# Patient Record
Sex: Male | Born: 1970 | Race: White | Hispanic: No | Marital: Single | Smoking: Former smoker
Health system: Southern US, Community
[De-identification: ages and names within clinical notes are randomized; demographics above are authoritative.]

## PROBLEM LIST (undated history)

## (undated) DIAGNOSIS — R569 Unspecified convulsions: Secondary | ICD-10-CM

## (undated) DIAGNOSIS — S069XAA Unspecified intracranial injury with loss of consciousness status unknown, initial encounter: Secondary | ICD-10-CM

## (undated) DIAGNOSIS — J45909 Unspecified asthma, uncomplicated: Secondary | ICD-10-CM

## (undated) DIAGNOSIS — J449 Chronic obstructive pulmonary disease, unspecified: Secondary | ICD-10-CM

## (undated) HISTORY — DX: Chronic obstructive pulmonary disease, unspecified: J44.9

---

## 2008-03-13 ENCOUNTER — Emergency Department (HOSPITAL_COMMUNITY): Admission: EM | Admit: 2008-03-13 | Discharge: 2008-03-13 | Payer: Self-pay | Admitting: Emergency Medicine

## 2010-01-09 ENCOUNTER — Emergency Department (HOSPITAL_COMMUNITY): Admission: EM | Admit: 2010-01-09 | Discharge: 2010-01-09 | Payer: Self-pay | Admitting: Emergency Medicine

## 2011-02-12 LAB — URINE MICROSCOPIC-ADD ON

## 2011-02-12 LAB — COMPREHENSIVE METABOLIC PANEL
ALT: 12 U/L (ref 0–53)
BUN: 12 mg/dL (ref 6–23)
Creatinine, Ser: 1.1 mg/dL (ref 0.4–1.5)
GFR calc Af Amer: >60 mL/min (ref >60)
GFR calc non Af Amer: >60 mL/min (ref >60)
Sodium: 143 mEq/L (ref 135–145)

## 2011-02-12 LAB — DIFFERENTIAL
Basophils Absolute: 0 10*3/uL (ref 0.0–0.1)
Basophils Relative: 0 % (ref 0–1)
Eosinophils Absolute: 0 10*3/uL (ref 0.0–0.7)
Monocytes Relative: 6 % (ref 3–12)
Neutro Abs: 19.6 10*3/uL — ABNORMAL HIGH (ref 1.7–7.7)
Neutrophils Relative %: 87 % — ABNORMAL HIGH (ref 43–77)

## 2011-02-12 LAB — RAPID URINE DRUG SCREEN, HOSP PERFORMED
Barbiturates: NOT DETECTED
Tetrahydrocannabinol: POSITIVE — AB

## 2011-02-12 LAB — CBC
Hemoglobin: 15.5 g/dL (ref 13.0–17.0)
MCV: 92.9 fL (ref 78.0–100.0)
Platelets: 272 10*3/uL (ref 150–400)
RDW: 12.1 % (ref 11.5–15.5)
WBC: 22.5 10*3/uL — ABNORMAL HIGH (ref 4.0–10.5)

## 2011-02-12 LAB — URINALYSIS, ROUTINE W REFLEX MICROSCOPIC
Bilirubin Urine: NEGATIVE
Nitrite: NEGATIVE
Specific Gravity, Urine: 1.016 (ref 1.005–1.030)
pH: 5.5 (ref 5.0–8.0)

## 2014-07-28 ENCOUNTER — Emergency Department (HOSPITAL_COMMUNITY): Payer: Self-pay

## 2014-07-28 ENCOUNTER — Encounter (HOSPITAL_COMMUNITY): Payer: Self-pay | Admitting: Emergency Medicine

## 2014-07-28 ENCOUNTER — Observation Stay (HOSPITAL_COMMUNITY)
Admission: EM | Admit: 2014-07-28 | Discharge: 2014-07-30 | Disposition: A | Discharge status: Home / Self Care | Payer: Self-pay | Attending: Internal Medicine | Admitting: Internal Medicine

## 2014-07-28 DIAGNOSIS — E878 Other disorders of electrolyte and fluid balance, not elsewhere classified: Secondary | ICD-10-CM

## 2014-07-28 DIAGNOSIS — H9209 Otalgia, unspecified ear: Secondary | ICD-10-CM | POA: Insufficient documentation

## 2014-07-28 DIAGNOSIS — S0003XA Contusion of scalp, initial encounter: Secondary | ICD-10-CM | POA: Insufficient documentation

## 2014-07-28 DIAGNOSIS — M79609 Pain in unspecified limb: Secondary | ICD-10-CM

## 2014-07-28 DIAGNOSIS — F411 Generalized anxiety disorder: Secondary | ICD-10-CM | POA: Diagnosis present

## 2014-07-28 DIAGNOSIS — S0083XA Contusion of other part of head, initial encounter: Secondary | ICD-10-CM | POA: Insufficient documentation

## 2014-07-28 DIAGNOSIS — R5383 Other fatigue: Secondary | ICD-10-CM

## 2014-07-28 DIAGNOSIS — R1031 Right lower quadrant pain: Secondary | ICD-10-CM

## 2014-07-28 DIAGNOSIS — H539 Unspecified visual disturbance: Secondary | ICD-10-CM | POA: Insufficient documentation

## 2014-07-28 DIAGNOSIS — R6883 Chills (without fever): Secondary | ICD-10-CM | POA: Insufficient documentation

## 2014-07-28 DIAGNOSIS — Z79899 Other long term (current) drug therapy: Secondary | ICD-10-CM | POA: Insufficient documentation

## 2014-07-28 DIAGNOSIS — R569 Unspecified convulsions: Secondary | ICD-10-CM

## 2014-07-28 DIAGNOSIS — F1220 Cannabis dependence, uncomplicated: Secondary | ICD-10-CM

## 2014-07-28 DIAGNOSIS — G40909 Epilepsy, unspecified, not intractable, without status epilepticus: Principal | ICD-10-CM | POA: Insufficient documentation

## 2014-07-28 DIAGNOSIS — E876 Hypokalemia: Secondary | ICD-10-CM | POA: Insufficient documentation

## 2014-07-28 DIAGNOSIS — J45909 Unspecified asthma, uncomplicated: Secondary | ICD-10-CM | POA: Insufficient documentation

## 2014-07-28 DIAGNOSIS — R5381 Other malaise: Secondary | ICD-10-CM | POA: Insufficient documentation

## 2014-07-28 DIAGNOSIS — R42 Dizziness and giddiness: Secondary | ICD-10-CM | POA: Insufficient documentation

## 2014-07-28 DIAGNOSIS — M25512 Pain in left shoulder: Secondary | ICD-10-CM | POA: Diagnosis present

## 2014-07-28 DIAGNOSIS — T148XXA Other injury of unspecified body region, initial encounter: Secondary | ICD-10-CM

## 2014-07-28 DIAGNOSIS — R1032 Left lower quadrant pain: Secondary | ICD-10-CM

## 2014-07-28 DIAGNOSIS — Y929 Unspecified place or not applicable: Secondary | ICD-10-CM | POA: Insufficient documentation

## 2014-07-28 DIAGNOSIS — Y939 Activity, unspecified: Secondary | ICD-10-CM | POA: Insufficient documentation

## 2014-07-28 DIAGNOSIS — F419 Anxiety disorder, unspecified: Secondary | ICD-10-CM | POA: Diagnosis present

## 2014-07-28 DIAGNOSIS — Z23 Encounter for immunization: Secondary | ICD-10-CM | POA: Insufficient documentation

## 2014-07-28 DIAGNOSIS — X58XXXA Exposure to other specified factors, initial encounter: Secondary | ICD-10-CM | POA: Insufficient documentation

## 2014-07-28 DIAGNOSIS — S1093XA Contusion of unspecified part of neck, initial encounter: Secondary | ICD-10-CM

## 2014-07-28 HISTORY — DX: Unspecified asthma, uncomplicated: J45.909

## 2014-07-28 LAB — PHOSPHORUS
Phosphorus: 1.6 mg/dL — ABNORMAL LOW (ref 2.3–4.6)
Phosphorus: 1.8 mg/dL — ABNORMAL LOW (ref 2.3–4.6)
Phosphorus: 1.8 mg/dL — ABNORMAL LOW (ref 2.3–4.6)

## 2014-07-28 LAB — CBC
HCT: 45.5 % (ref 39.0–52.0)
Hemoglobin: 15.6 g/dL (ref 13.0–17.0)
MCH: 31.8 pg (ref 26.0–34.0)
MCHC: 34.3 g/dL (ref 30.0–36.0)
MCV: 92.9 fL (ref 78.0–100.0)
Platelets: 264 10*3/uL (ref 150–400)
RBC: 4.9 MIL/uL (ref 4.22–5.81)
RDW: 12.9 % (ref 11.5–15.5)
WBC: 13.1 10*3/uL — ABNORMAL HIGH (ref 4.0–10.5)

## 2014-07-28 LAB — BASIC METABOLIC PANEL
ANION GAP: 14 (ref 5–15)
Anion gap: 10 (ref 5–15)
Anion gap: 10 (ref 5–15)
BUN: 10 mg/dL (ref 6–23)
BUN: 11 mg/dL (ref 6–23)
BUN: 9 mg/dL (ref 6–23)
CALCIUM: 6.1 mg/dL — AB (ref 8.4–10.5)
CALCIUM: 8.5 mg/dL (ref 8.4–10.5)
CALCIUM: 8.5 mg/dL (ref 8.4–10.5)
CO2: 19 meq/L (ref 19–32)
CO2: 24 meq/L (ref 19–32)
CO2: 20 mEq/L (ref 19–32)
CREATININE: 1.09 mg/dL (ref 0.50–1.35)
Chloride: 116 mEq/L — ABNORMAL HIGH (ref 96–112)
Chloride: 107 mEq/L (ref 96–112)
Chloride: 107 mEq/L (ref 96–112)
Creatinine, Ser: 0.75 mg/dL (ref 0.50–1.35)
Creatinine, Ser: 1.04 mg/dL (ref 0.50–1.35)
GFR calc Af Amer: >90 mL/min (ref >90)
GFR calc Af Amer: >90 mL/min (ref >90)
GFR calc non Af Amer: 81 mL/min — ABNORMAL LOW (ref >90)
GFR calc non Af Amer: 86 mL/min — ABNORMAL LOW (ref >90)
GLUCOSE: 81 mg/dL (ref 70–99)
GLUCOSE: 92 mg/dL (ref 70–99)
Glucose, Bld: 95 mg/dL (ref 70–99)
POTASSIUM: 2.6 meq/L — AB (ref 3.7–5.3)
Potassium: 5.1 mEq/L (ref 3.7–5.3)
Potassium: 4.2 mEq/L (ref 3.7–5.3)
SODIUM: 145 meq/L (ref 137–147)
Sodium: 141 mEq/L (ref 137–147)
Sodium: 141 mEq/L (ref 137–147)

## 2014-07-28 LAB — URINALYSIS, ROUTINE W REFLEX MICROSCOPIC
BILIRUBIN URINE: NEGATIVE
Glucose, UA: NEGATIVE mg/dL
Hgb urine dipstick: NEGATIVE
Ketones, ur: NEGATIVE mg/dL
Leukocytes, UA: NEGATIVE
NITRITE: NEGATIVE
PH: 6 (ref 5.0–8.0)
Protein, ur: NEGATIVE mg/dL
Specific Gravity, Urine: 1.019 (ref 1.005–1.030)
Urobilinogen, UA: 0.2 mg/dL (ref 0.0–1.0)

## 2014-07-28 LAB — HEPATIC FUNCTION PANEL
ALT: 37 U/L (ref 0–53)
AST: 26 U/L (ref 0–37)
Albumin: 3.6 g/dL (ref 3.5–5.2)
Alkaline Phosphatase: 48 U/L (ref 39–117)
Total Bilirubin: 0.3 mg/dL (ref 0.3–1.2)
Total Protein: 6.7 g/dL (ref 6.0–8.3)

## 2014-07-28 LAB — MAGNESIUM: Magnesium: 2.2 mg/dL (ref 1.5–2.5)

## 2014-07-28 LAB — RAPID URINE DRUG SCREEN, HOSP PERFORMED
Amphetamines: NOT DETECTED
BARBITURATES: NOT DETECTED
BENZODIAZEPINES: NOT DETECTED
COCAINE: NOT DETECTED
OPIATES: NOT DETECTED
Tetrahydrocannabinol: POSITIVE — AB

## 2014-07-28 LAB — TSH: TSH: 1.49 u[IU]/mL (ref 0.350–4.500)

## 2014-07-28 LAB — T3, FREE: T3 FREE: 3.8 pg/mL (ref 2.3–4.2)

## 2014-07-28 LAB — CBG MONITORING, ED
GLUCOSE-CAPILLARY: 98 mg/dL (ref 70–99)
Glucose-Capillary: 41 mg/dL — CL (ref 70–99)

## 2014-07-28 LAB — I-STAT TROPONIN, ED: Troponin i, poc: 0.03 ng/mL (ref 0.00–0.08)

## 2014-07-28 MED ORDER — HEPARIN SODIUM (PORCINE) 5000 UNIT/ML IJ SOLN
5000.0000 [IU] | Freq: Three times a day (TID) | INTRAMUSCULAR | Status: DC
  Administered 2014-07-28 – 2014-07-30 (×6): 5000 [IU] via SUBCUTANEOUS
  Filled 2014-07-28 (×8): qty 1

## 2014-07-28 MED ORDER — POTASSIUM CHLORIDE CRYS ER 20 MEQ PO TBCR: 40.0000 meq | EXTENDED_RELEASE_TABLET | Freq: Once | ORAL | Status: DC

## 2014-07-28 MED ORDER — K PHOS MONO-SOD PHOS DI & MONO 155-852-130 MG PO TABS
500.0000 mg | ORAL_TABLET | Freq: Two times a day (BID) | ORAL | Status: DC
  Filled 2014-07-28: qty 2

## 2014-07-28 MED ORDER — K PHOS MONO-SOD PHOS DI & MONO 155-852-130 MG PO TABS
250.0000 mg | ORAL_TABLET | Freq: Three times a day (TID) | ORAL | Status: AC
  Administered 2014-07-28 – 2014-07-29 (×3): 250 mg via ORAL
  Filled 2014-07-28 (×3): qty 1

## 2014-07-28 MED ORDER — OXYCODONE-ACETAMINOPHEN 5-325 MG PO TABS
1.0000 | ORAL_TABLET | Freq: Four times a day (QID) | ORAL | Status: DC | PRN
  Administered 2014-07-28: 1 via ORAL
  Filled 2014-07-28: qty 1

## 2014-07-28 MED ORDER — ALBUTEROL SULFATE (2.5 MG/3ML) 0.083% IN NEBU
2.5000 mg | INHALATION_SOLUTION | Freq: Four times a day (QID) | RESPIRATORY_TRACT | Status: DC | PRN
  Administered 2014-07-29 – 2014-07-30 (×2): 2.5 mg via RESPIRATORY_TRACT
  Filled 2014-07-28 (×2): qty 3

## 2014-07-28 MED ORDER — POTASSIUM CHLORIDE 10 MEQ/100ML IV SOLN
10.0000 meq | Freq: Once | INTRAVENOUS | Status: AC
  Administered 2014-07-28: 10 meq via INTRAVENOUS
  Filled 2014-07-28: qty 100

## 2014-07-28 MED ORDER — SODIUM CHLORIDE 0.9 % IJ SOLN
3.0000 mL | Freq: Two times a day (BID) | INTRAMUSCULAR | Status: DC
  Administered 2014-07-30: 3 mL via INTRAVENOUS

## 2014-07-28 MED ORDER — ALBUTEROL SULFATE HFA 108 (90 BASE) MCG/ACT IN AERS: 2.0000 | INHALATION_SPRAY | Freq: Four times a day (QID) | RESPIRATORY_TRACT | Status: DC | PRN

## 2014-07-28 MED ORDER — PNEUMOCOCCAL VAC POLYVALENT 25 MCG/0.5ML IJ INJ
0.5000 mL | INJECTION | INTRAMUSCULAR | Status: AC
  Administered 2014-07-29: 0.5 mL via INTRAMUSCULAR
  Filled 2014-07-28: qty 0.5

## 2014-07-28 MED ORDER — CEFTRIAXONE SODIUM 1 G IJ SOLR
1.0000 g | Freq: Once | INTRAMUSCULAR | Status: AC
  Administered 2014-07-28: 1 g via INTRAVENOUS
  Filled 2014-07-28: qty 10

## 2014-07-28 MED ORDER — ONDANSETRON HCL 4 MG PO TABS: 4.0000 mg | ORAL_TABLET | Freq: Four times a day (QID) | ORAL | Status: DC | PRN

## 2014-07-28 MED ORDER — ACETAMINOPHEN 325 MG PO TABS: 650.0000 mg | ORAL_TABLET | Freq: Four times a day (QID) | ORAL | Status: DC | PRN

## 2014-07-28 MED ORDER — ACETAMINOPHEN 500 MG PO TABS
1000.0000 mg | ORAL_TABLET | Freq: Once | ORAL | Status: AC
  Administered 2014-07-28: 1000 mg via ORAL
  Filled 2014-07-28: qty 2

## 2014-07-28 MED ORDER — SODIUM CHLORIDE 0.9 % IV SOLN
1000.0000 mL | Freq: Once | INTRAVENOUS | Status: AC
  Administered 2014-07-28: 1000 mL via INTRAVENOUS

## 2014-07-28 MED ORDER — ONDANSETRON HCL 4 MG/2ML IJ SOLN
4.0000 mg | Freq: Four times a day (QID) | INTRAMUSCULAR | Status: DC | PRN
  Administered 2014-07-29 (×2): 4 mg via INTRAVENOUS
  Filled 2014-07-28 (×2): qty 2

## 2014-07-28 MED ORDER — SODIUM CHLORIDE 0.9 % IV SOLN
INTRAVENOUS | Status: DC
  Administered 2014-07-28 – 2014-07-30 (×3): via INTRAVENOUS

## 2014-07-28 MED ORDER — IBUPROFEN 600 MG PO TABS
600.0000 mg | ORAL_TABLET | Freq: Four times a day (QID) | ORAL | Status: DC | PRN
  Filled 2014-07-28: qty 1

## 2014-07-28 MED ORDER — POTASSIUM CHLORIDE CRYS ER 20 MEQ PO TBCR
40.0000 meq | EXTENDED_RELEASE_TABLET | Freq: Once | ORAL | Status: AC
  Administered 2014-07-28: 40 meq via ORAL
  Filled 2014-07-28: qty 2

## 2014-07-28 MED ORDER — ONDANSETRON HCL 4 MG/2ML IJ SOLN
4.0000 mg | Freq: Once | INTRAMUSCULAR | Status: AC
  Administered 2014-07-28: 4 mg via INTRAVENOUS
  Filled 2014-07-28: qty 2

## 2014-07-28 NOTE — ED Notes (Addendum)
Pt was out walking his dog this am and had witnessed 3 min seizure. Hx of seizure 5 years ago. sts nausea and blurred vision which has resolved. Pt A&Ox3. CBG 177. BP 110/75. HR 95. Pt has abrasion and pain to right hip and elbow. Pt also had nosebleed that resolved.
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
## 2014-07-28 NOTE — ED Notes (Signed)
Patient transported to X-ray 
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
## 2014-07-28 NOTE — ED Notes (Signed)
Pt remains in radiology 

## 2014-07-28 NOTE — ED Provider Notes (Signed)
CSN: [id number]     Arrival date & time 07/28/14  0827 History   First MD Initiated Contact with Patient 07/28/14 1053     Chief Complaint  Patient presents with  . Seizures     (Consider location/radiation/quality/duration/timing/severity/associated sxs/prior Treatment) HPI This patient has one prior history of a seizure 5 years ago. It seems there was not a very definitive diagnosis. He reports that he was diagnosed with a febrile seizure within 30 minutes of being at the hospital and then discharged. Today he awoke from sleep at about 6:45 AM in the morning, he did not note any specific symptoms at that point time and walked outside to let his dogs out. Patient is unaware of what happened subsequent to that he denies any preceding symptoms. His roommate came out to the barking of the dogs and found him at the bottom of 4 stairs on the concrete with his arms and legs moving in a seizure-like pattern. He describes them as convulsing with white foam in his mouth. This lasted approximately 5 minutes. Subsequent to this the patient went through approximately a 5-10 minute period of being disoriented and not able to really speak. Within the next 5-10 minutes some gradual gradual resolution began, and then he reports that about the time that they got to the hospital by EMS the patient seemed more oriented and back to himself. The patient reports now he has a significant headache on the top of his head. It's like a heavy pressure. He reports his vision is still slightly blurry. He has pain in his left shoulder and right hip from a fall. He is able to move them all be it is painful.   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 He went to bed at approximately 10:45 PM he reports he does smoke marijuana and had had some marijuana at approximately 9 PM yesterday evening. He does not use any other drugs or alcohol. He has not started any other new or recent  medications. Patient does identify now that he thinks about it over the past week or so he has had increasing fatigue, intermittent headache pressure-like in quality.  Past Medical History  Diagnosis Date  . Asthma    History reviewed. No pertinent past surgical history. History reviewed. No pertinent family history. History  Substance Use Topics  . Smoking status: Never Smoker   . Smokeless tobacco: Not on file  . Alcohol Use: No    Review of Systems  Constitutional: Positive for chills and fatigue. Negative for fever, activity change and appetite change.  HENT: Positive for ear pain and sinus pressure. Negative for facial swelling and nosebleeds.   Eyes: Positive for visual disturbance (the patient has noticed some increased blurring of the vision and necessity to work harder to focus.).  Respiratory: Negative.   Cardiovascular: Negative.   Gastrointestinal: Negative.   Genitourinary: Negative.   Neurological: Positive for dizziness, weakness and headaches. Negative for tremors, syncope, facial asymmetry and speech difficulty.      Allergies  Review of patient's allergies indicates no known allergies.  Home Medications   Prior to Admission medications   Medication Sig Start Date End Date Taking? Authorizing Provider  albuterol (PROVENTIL HFA;VENTOLIN HFA) 108 (90 BASE) MCG/ACT inhaler Inhale 2 puffs into the lungs every 6 (six) hours as needed for wheezing or shortness of breath.   Yes Historical Provider, MD   BP 116/81  Pulse 74  Temp(Src) 97.7  F (36.5 C) (Oral)  Resp 20  SpO2 99% Physical Exam Gen. appearance: The patient is a slender but well nourished appearing. He is alert but appears mildly anxious and in mild to moderate pain. Head face: Patient has a contusion to the right posterior scalp no bleeding or laceration associated. Eyes: Pupils are symmetric and reactive to light extraocular motions are intact. Nose: the patient does have a small amount of bloody  material in the right nare but there is no active bleeding and no clots. Oral cavity: No tongue laceration present posterior oropharynx is widely patent no apparent dental injury. Ears: Right ear has cerumen impaction. Left ear is clear with normal TM. Neck: No C-spine tenderness to direct palpation no pain with normal range of motion. Lungs: Clear to auscultation without wheeze rhonchi rail Heart: Regular no rub or gallop Abdomen: Soft nontender no mass. Pelvis: Patient has a 5 cm contusion to the anterior iliac spine. He also has superficial abrasion to the lateral hip area. He does however have intact range of motion. The pelvis is stable to compression. Extremities the patient has pain over the left lateral shoulder. At this point in time there is no apparent deformity or contusion or abrasion. The right elbow has a contusion over the olecranon with very superficial abrasion. Range of motion is intact. Patient has contusions to both knees there is no deformity or effusion associated. The range of motion is intact. Normal visual inspection of the back bony elements are nontender to palpation. Neurologic: Patient is alert and oriented x3. Cranial nerves are intact. Upper extremity strength and lower extremity strength is 5 out of 5. Cerebellar examination is normal. Skin: Warm and dry  ED Course  Procedures (including critical care time) Labs Review Labs Reviewed  CBC - Abnormal; Notable for the following:    WBC 13.1 (*)    All other components within normal limits  CBG MONITORING, ED - Abnormal; Notable for the following:    Glucose-Capillary 41 (*)    All other components within normal limits  BASIC METABOLIC PANEL  CBG MONITORING, ED    Imaging Review No results found.   EKG Interpretation None     The patient has been admitted to the hospitalist service. Also I did review the case with Dr. Amada Jupiter of neurology who will evaluate the patient as well. CRITICAL  CARE Performed by: Arby Barrette   Total critical care time: 30 minutes  Critical care time was exclusive of separately billable procedures and treating other patients.  Critical care was necessary to treat or prevent imminent or life-threatening deterioration.  Critical care was time spent personally by me on the following activities: development of treatment plan with patient and/or surrogate as well as nursing, discussions with consultants, evaluation of patient's response to treatment, examination of patient, obtaining history from patient or surrogate, ordering and performing treatments and interventions, ordering and review of laboratory studies, ordering and review of radiographic studies, pulse oximetry and re-evaluation of patient's condition. MDM   Final diagnoses:  Seizure  Hypocalcemia  Hypokalemia  Contusion   This patient will be admitted for further workup with a seizure and electrolyte abnormalities. The patient has remained stable in terms of mental status and vital signs. Injuries resulting from his seizure appear to be limited to soft tissue contusion and abrasion. Further workup will be needed to determine the etiology of the patient's hypokalemia, hypocalcemia and seizure.     Arby Barrette, MD 07/28/14 1420

## 2014-07-28 NOTE — ED Notes (Signed)
Patient transported to CT 

## 2014-07-29 ENCOUNTER — Observation Stay (HOSPITAL_COMMUNITY): Payer: Self-pay

## 2014-07-29 DIAGNOSIS — J329 Chronic sinusitis, unspecified: Secondary | ICD-10-CM

## 2014-07-29 DIAGNOSIS — F411 Generalized anxiety disorder: Secondary | ICD-10-CM | POA: Diagnosis present

## 2014-07-29 DIAGNOSIS — M25519 Pain in unspecified shoulder: Secondary | ICD-10-CM

## 2014-07-29 DIAGNOSIS — R569 Unspecified convulsions: Secondary | ICD-10-CM

## 2014-07-29 DIAGNOSIS — R1032 Left lower quadrant pain: Secondary | ICD-10-CM

## 2014-07-29 DIAGNOSIS — M25512 Pain in left shoulder: Secondary | ICD-10-CM | POA: Diagnosis present

## 2014-07-29 DIAGNOSIS — F1220 Cannabis dependence, uncomplicated: Secondary | ICD-10-CM | POA: Diagnosis present

## 2014-07-29 DIAGNOSIS — F419 Anxiety disorder, unspecified: Secondary | ICD-10-CM | POA: Diagnosis present

## 2014-07-29 DIAGNOSIS — R1031 Right lower quadrant pain: Secondary | ICD-10-CM | POA: Diagnosis present

## 2014-07-29 LAB — BASIC METABOLIC PANEL
Anion gap: 10 (ref 5–15)
BUN: 10 mg/dL (ref 6–23)
CHLORIDE: 105 meq/L (ref 96–112)
CO2: 24 mEq/L (ref 19–32)
Calcium: 8.2 mg/dL — ABNORMAL LOW (ref 8.4–10.5)
Creatinine, Ser: 1.09 mg/dL (ref 0.50–1.35)
GFR calc Af Amer: >90 mL/min (ref >90)
GFR calc non Af Amer: 81 mL/min — ABNORMAL LOW (ref >90)
GLUCOSE: 108 mg/dL — AB (ref 70–99)
POTASSIUM: 4.2 meq/L (ref 3.7–5.3)
Sodium: 139 mEq/L (ref 137–147)

## 2014-07-29 LAB — CBC
HCT: 39.7 % (ref 39.0–52.0)
HEMOGLOBIN: 13.7 g/dL (ref 13.0–17.0)
MCH: 31.7 pg (ref 26.0–34.0)
MCHC: 34.5 g/dL (ref 30.0–36.0)
MCV: 91.9 fL (ref 78.0–100.0)
Platelets: 271 10*3/uL (ref 150–400)
RBC: 4.32 MIL/uL (ref 4.22–5.81)
RDW: 13.1 % (ref 11.5–15.5)
WBC: 9.3 10*3/uL (ref 4.0–10.5)

## 2014-07-29 LAB — VITAMIN B12: Vitamin B-12: 689 pg/mL (ref 211–911)

## 2014-07-29 LAB — HIV ANTIBODY (ROUTINE TESTING W REFLEX): HIV 1&2 Ab, 4th Generation: NONREACTIVE

## 2014-07-29 LAB — TROPONIN I: Troponin I: <0.3 ng/mL (ref <0.30)

## 2014-07-29 MED ORDER — AMOXICILLIN-POT CLAVULANATE 500-125 MG PO TABS
1.0000 | ORAL_TABLET | Freq: Three times a day (TID) | ORAL | Status: DC
  Administered 2014-07-29 – 2014-07-30 (×3): 500 mg via ORAL
  Filled 2014-07-29 (×6): qty 1

## 2014-07-29 NOTE — H&P (Signed)
INTERNAL MEDICINE TEACHING ATTENDING NOTE  Day 1 of stay  Patient name: Wayne Hawkins  MRN: [id number] Date of birth: 1971/01/19   Key clinical points and exam                                                          43 y.o. male with witnessed seizures - appear generalized clonic-tonic by description, likely new onset, but claims to have had the same exact seizure 5 years ago, witnessed by the same friend who saw him this time. 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 CT head negative for acute changes. Patient takes homegrown marijuana 2-3 times a day. Last does at 9:30 pm on 04/26/14. 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 Complains of seeing spots in front of eyes. Complains of dizziness with exertion, and with standing up from lying or sitting position. Reports anxiety, sleep disturbance, depression, shakiness/tremors, sweating, headache.  Past medical history: Syncope in 2011 ED visit, diagnosed of a viral infection at that time; Marijuana dependence; Tachycardia at rest.   Exam: vitals reviewed.  General: Resting in bed. Appears anxious. HEENT: PERRL, EOMI, no scleral icterus. Heart: Regular tachycardia, no rubs, murmurs or gallops. Lungs: wheezes positive, no rales, or rhonchi. Abdomen: Soft, nontender, nondistended, BS present. Extremities: Warm, no pedal edema. Tremors hands outstretched. Sore left inner thigh area to palpation, but no exterior abnormality. Neuro: Alert and oriented X3, cranial nerves II-XII grossly intact,  strength and sensation to light touch equal in bilateral upper and lower extremities   Assessment and Plan                                                                      Agree with plan of care documented by Dr Beckie Salts and Sherrine Maples.   Seizures - Neurology on consult. MRI brain & cervical spine in process. EEG pending. Initial work up showed major electrolyte imbalances, however it seems to be erroneous. Unsure if the  seizures could be related to marijuana overdose. TSH normal.   Left groin pain - likely musculoskeletal injury related.   Tachycardia - present back in 2011 notes as well. Unclear etiology. 2Decho for possible ?? tachycardia cardiomyopathy.   Marijuana dependence and withdrawal - The patient is anxious, nervous, sweating intermittently, tremors, vomited once, -- these are all classic signs of marijuana withdrawal although the patient is not ready to accept it, and says that marijuana does not have withdrawal signs. Symptomatic treatment.   Orthostatic dizziness - Orthostatic vitals, 2d echo to begin with work up. Physical therapy rehabilitation if needed. I did not walk the patient, but the patient feels unsteady to get up at this time.  Spots in front of the eyes at times - Never had transient loss of vision. CT head is clear. Will await MRI results. Will discuss this with neurology.   I have seen and evaluated this patient and discussed it with my IM resident team.  Please see the rest of the plan per resident note from today.   Aletta Edouard 07/29/2014,  11:23 AM.
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
## 2014-07-29 NOTE — Consult Note (Signed)
Patient ID: Wayne Hawkins MRN: [id number] DOB/AGE: 05-24-1971 43 y.o.  Admit date: 07/28/2014  Admission Diagnoses:  Principal Problem:   Observed seizure-like activity Active Problems:   Marijuana dependence   Severe pain of left shoulder   Bilateral groin pain   Anxiety state, unspecified   HPI: Patient was admitted yesterday for seizure.  We were asked to do a consult for left shoulder pain.  Started yesterday after the seizure.  No previous problems.  No feeling of instability.  States that shoulder is much getter now after it "popped".  MRI was done.    Past Medical History: Past Medical History  Diagnosis Date  . Asthma     Surgical History: History reviewed. No pertinent past surgical history.  Family History: History reviewed. No pertinent family history.  Social History: History   Social History  . Marital Status: Single    Spouse Name: N/A    Number of Children: N/A  . Years of Education: N/A   Occupational History  . Not on file.   Social History Main Topics  . Smoking status: Never Smoker   . Smokeless tobacco: Not on file  . Alcohol Use: No  . Drug Use: Not on file  . Sexual Activity: Yes   Other Topics Concern  . Not on file   Social History Narrative  . No narrative on file    Allergies: Review of patient's allergies indicates no known allergies.  Medications: I have reviewed the patient's current medications.  Vital Signs: Patient Vitals for the past 24 hrs:  BP Temp Temp src Pulse Resp SpO2  07/29/14 1659 - - - - - 96 %  07/29/14 1319 122/93 mmHg 98.9 F (37.2 C) Oral 81 18 95 %  07/29/14 0500 135/79 mmHg 98.7 F (37.1 C) - 75 18 98 %  07/28/14 2030 127/74 mmHg 98.5 F (36.9 C) - 81 18 96 %    Radiology: Dg Elbow 2 Views Right  07/28/2014   CLINICAL DATA:  Seizure, fell from porch, now with right elbow pain with small laceration  EXAM: RIGHT ELBOW - 2 VIEW  COMPARISON:  None.  FINDINGS: No displaced fracture or elbow  joint effusion. Regional soft tissues appear normal. No radiopaque foreign body. Joint spaces are preserved.  IMPRESSION: No displaced fracture, elbow joint effusion or radiopaque foreign body.   Electronically Signed   By: Simonne Come M.D.   On: 07/28/2014 13:24   Dg Hip Complete Right  07/28/2014   CLINICAL DATA:  Seizure and fall.  Right hip pain and bruising.  EXAM: RIGHT HIP - COMPLETE 2+ VIEW  COMPARISON:  None.  FINDINGS: No acute fracture or dislocation is identified about the right hip. Hip joint space width is preserved. No lytic or blastic osseous lesion is identified. Left hip is grossly located. SI joints are approximated. Pelvic phleboliths are noted.  IMPRESSION: No acute osseous abnormality identified.   Electronically Signed   By: Sebastian Ache   On: 07/28/2014 13:26   Ct Head Wo Contrast   (if New Onset Seizure And/or Head Trauma)  07/28/2014   CLINICAL DATA:  Seizure, blurred vision, epistaxis  EXAM: CT HEAD WITHOUT CONTRAST  TECHNIQUE: Contiguous axial images were obtained from the base of the skull through the vertex without intravenous contrast.  COMPARISON:  01/09/2010  FINDINGS: No evidence of parenchymal hemorrhage or extra-axial fluid collection. No mass lesion, mass effect, or midline shift.  No CT evidence of acute infarction.  Cerebral volume is  within normal limits.  No ventriculomegaly.  Partial opacification of the bilateral ethmoid sinuses. The mastoid air cells are unopacified.  No evidence of calvarial fracture.  IMPRESSION: Normal head CT.   Electronically Signed   By: Charline Bills M.D.   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 Possible febrile seizures 5 years ago. Recent generalized seizure lasted 5 min. Drug use. Abnormal potassium.  EXAM: MRI HEAD WITHOUT CONTRAST  TECHNIQUE: Multiplanar, multiecho pulse sequences of the brain and surrounding structures were  obtained without intravenous contrast.  COMPARISON:  07/28/2014 head CT. No comparison brain MR. Cervical spine MR performed same date and dictated separately.  FINDINGS: No acute infarct.  No evidence of mesial temporal sclerosis.  Anterior left frontal lobe encephalomalacia may be related to patient's remote traumatic injury and possibly represents a seizure focus.  No intracranial hemorrhage.  No hydrocephalus.  Asymmetric appearance of the pituitary gland with slight fullness on the left compared to the right. Left left-sided pituitary microadenoma could not be excluded on limited imaging through this region. It is possible this represents an incidental finding for this patient. Otherwise, no intracranial mass lesion noted on this unenhanced exam.  Cervical medullary junction, pineal region and orbital structures unremarkable.  Complete opacification right maxillary sinus. Moderate mucosal thickening remainder of paranasal sinuses.  IMPRESSION: No acute infarct.  No evidence of mesial temporal sclerosis.  Anterior left frontal lobe encephalomalacia may be related to patient's remote traumatic injury and possibly represents a seizure focus.  Asymmetric appearance of the pituitary gland with slight fullness on the left compared to the right. Left left-sided pituitary microadenoma could not be excluded on limited imaging through this region. It is possible this represents an incidental finding for this patient. Otherwise, no intracranial mass lesion noted on this unenhanced exam.  Complete opacification right maxillary sinus. Moderate mucosal thickening remainder of paranasal sinuses.   Electronically Signed   By: Bridgett Larsson M.D.   On: 07/29/2014 12:52   Mr Cervical Spine Wo Contrast  07/29/2014   CLINICAL DATA:  Seizure with fall.  Right shoulder pain.  EXAM: MRI CERVICAL SPINE WITHOUT CONTRAST  TECHNIQUE: Multiplanar, multisequence MR imaging of the cervical spine was performed. No intravenous contrast was  administered.  COMPARISON:  No comparison cervical spine exam.  FINDINGS: Patient had to move between axial and sagittal imaging. A scout sagittal sequence was therefore obtained for proper level assignment of axial imaging.  Cervical medullary junction unremarkable. No focal cervical cord signal abnormality.  No bone edema or soft tissue edema to suggest ligamentous or osseous injury. If osseous injury were of high clinical concern, CT imaging may be considered for further delineation.  Opacification right maxillary sinus and mucosal thickening left maxillary sinus.  Both vertebral arteries are patent. Visualized paravertebral structures unremarkable.  C2-3:  Negative.  C3-4:  Minimal right foraminal narrowing.  C4-5:  Minimal bulge.  C5-6: Shallow broad-based protrusion greatest right paracentral position. Mild spinal stenosis greater on the right.  C6-7: Moderate size right paracentral/ posterior lateral disc protrusion with mild flattening of the right aspect of the cord and narrowing of the right neural foramen.  C7-T1:  Negative.  IMPRESSION: C6-7 moderate size right paracentral/ posterior lateral disc protrusion with mild flattening of the right aspect of the cord and narrowing of the right neural foramen.  C5-6 shallow broad-based protrusion greatest right paracentral position. Mild  spinal stenosis greater on the right.  Patient had to move between axial and sagittal imaging. A scout sagittal sequence was therefore obtained for proper level assignment of axial imaging.  No bone edema or soft tissue edema to suggest ligamentous or osseous injury. If osseous injury were of high clinical concern, CT imaging may be considered for further delineation.  Opacification right maxillary sinus and mucosal thickening left maxillary sinus.   Electronically Signed   By: Bridgett Larsson M.D.   On: 07/29/2014 10:42   Mr Shoulder Left Wo Contrast  07/29/2014   CLINICAL DATA:  Left shoulder pain after seizure/ fall.  EXAM: MRI  OF THE LEFT SHOULDER WITHOUT CONTRAST  TECHNIQUE: Multiplanar, multisequence MR imaging of the shoulder was performed. No intravenous contrast was administered.  COMPARISON:  07/28/2014  FINDINGS: Despite efforts by the technologist and patient, motion artifact is present on today's exam and could not be eliminated. This reduces exam sensitivity and specificity.  Rotator cuff:  Intact  Muscles:  Subtle increased T2 signal in the teres minor muscle.  Biceps long head:  Intact  Acromioclavicular Joint: Mild degenerative arthropathy. Subacromial morphology is type 2 (curved). Trace edema along the coracoid acromial ligament.  Glenohumeral Joint: Lax appearance of the posterior inferior joint capsule making it difficult to completely exclude an injury of the posterior band of the inferior glenohumeral ligament or local capsular injury.  Labrum: Blunted posterior inferior labrum, could be torn, best appreciated on the axial images  Bones: Subtle edema anteriorly in the greater tuberosity, without fracture identified.  IMPRESSION: 1. Blunted posterior inferior labrum with lax appearance of the adjacent joint capsule, query labral tear and/or capsular injury. The possibility of injury to the posterior band of the IGL is also raised. MR arthrography could further delineate this region. 2. Faintly increased T2 signal in the teres minor muscle, potentially from strain or quadrilateral space syndrome. 3. Subtle edema anteriorly in the greater tuberosity of the humerus, probably degenerative.   Electronically Signed   By: Herbie Baltimore M.D.   On: 07/29/2014 13:09   Dg Shoulder Left  07/28/2014   CLINICAL DATA:  Seizure.  Fall.  EXAM: LEFT SHOULDER - 2+ VIEW  COMPARISON:  None.  FINDINGS: No acute fracture.  No dislocation.  IMPRESSION: No acute bony pathology.   Electronically Signed   By: Maryclare Bean M.D.   On: 07/28/2014 13:24    Labs:  Recent Labs  07/28/14 1020 07/29/14 0315  WBC 13.1* 9.3  RBC 4.90 4.32  HCT  45.5 39.7  PLT 264 271    Recent Labs  07/28/14 1820 07/29/14 0315  NA 141 139  K 4.2 4.2  CL 107 105  CO2 20 24  BUN 9 10  CREATININE 1.04 1.09  GLUCOSE 95 108*  CALCIUM 8.5 8.2*   No results found for this basename: LABPT, INR,  in the last 72 hours  Review of Systems: Review of Systems  Constitutional: Negative.   Respiratory: Negative.   Cardiovascular: Negative.   Musculoskeletal: Positive for joint pain.  Skin: Negative.   Psychiatric/Behavioral: Negative.     Physical Exam: Left shoulder.  Good ROM.  Mildly positive impingement test.  Neg obrien.  Neg drop arm.  Mild pain with supraspinatus resistance.  NVI.  Skin warm and dry.    Assessment and Plan: Left shoulder pain after seizure likely due to strain.  Pain is much better now after he had MRI scan.  Can f/u in office with Dr Hayden Rasmussen in  1-2 weeks for recheck.  Will very likely not need any surgical intervention for his shoulder.    Venita Lick, MD [facility] [phone]

## 2014-07-29 NOTE — Progress Notes (Signed)
Utilization Review Completed.   Lessie Funderburke, RN, BSN Nurse Case Manager  

## 2014-07-29 NOTE — Progress Notes (Signed)
Subjective: No further events.   Exam: Filed Vitals:   07/29/14 0500  BP: 135/79  Pulse: 75  Temp: 98.7 F (37.1 C)  Resp: 18   Gen: In bed, NAD MS: Awake, alert, interactive and appropriate JX:[id number], EOMI Motor: 5/5 thorughout Sensory:intact to LT DTR:3+ and symmetric  Repeat calciums have all been normal without replacement making think that the first value was spurious.   Impression: 43 yo M with second unprovoked seizure. In this setting I do typically recommend anti-epileptic therapy. May be reasonable to wait until after EEG to start therapy as it can lower yield. His hyperreflexia may be non-pathological, but it is prominent and therefore would also get C-Spine imagin.   Recommendations: 1) EEG 2) MRI brain/Cspine  Ritta Slot, MD [facility] [phone]  If 7pm- 7am, please page neurology on call as listed in AMION.

## 2014-07-30 ENCOUNTER — Observation Stay (HOSPITAL_COMMUNITY): Payer: Self-pay

## 2014-07-30 DIAGNOSIS — I951 Orthostatic hypotension: Secondary | ICD-10-CM

## 2014-07-30 MED ORDER — LEVETIRACETAM 500 MG PO TABS: 500.0000 mg | ORAL_TABLET | Freq: Two times a day (BID) | ORAL | Status: DC

## 2014-07-30 MED ORDER — LEVETIRACETAM 500 MG PO TABS
500.0000 mg | ORAL_TABLET | Freq: Two times a day (BID) | ORAL | Status: DC
  Administered 2014-07-30: 500 mg via ORAL
  Filled 2014-07-30 (×2): qty 1

## 2014-07-30 MED ORDER — AMOXICILLIN-POT CLAVULANATE 500-125 MG PO TABS: 1.0000 | ORAL_TABLET | Freq: Three times a day (TID) | ORAL | Status: DC

## 2014-07-30 MED ORDER — IBUPROFEN 600 MG PO TABS: 600.0000 mg | ORAL_TABLET | Freq: Four times a day (QID) | ORAL | Status: DC | PRN

## 2014-07-30 MED ORDER — SODIUM CHLORIDE 0.9 % IV BOLUS (SEPSIS)
1000.0000 mL | Freq: Once | INTRAVENOUS | Status: AC
  Administered 2014-07-30: 1000 mL via INTRAVENOUS

## 2014-07-30 NOTE — Progress Notes (Signed)
Subjective: No further events.   Exam: Filed Vitals:   07/30/14 0616  BP: 128/79  Pulse: 73  Temp: 98.3 F (36.8 C)  Resp: 18   Gen: In bed, NAD MS: Awake, alert, interactive and appropriate [id number], EOMI Motor: 5/5 thorughout Sensory:intact to LT DTR:3+ and symmetric  Repeat calciums have all been normal without replacement making think that the first value was spurious.   Impression: 43 yo M with second unprovoked seizure. In this setting I do typically recommend anti-epileptic therapy. May be reasonable to wait until after EEG to start therapy as it can lower yield. No signs of a cause of hyperreflexia on MRI, I do not think the C6-7 finding is enough to cause it. B12 was normal, do not think I would pursue further workup at this time unless he were to get worse or have gait difficulty or other myelopathic signs.   Recommendations: 1) EEG 2) following EEG would start keppra  IBD 3) Patient is unable to drive, operate heavy machinery, perform activities at heights or participate in water activities until release by outpatient physician. This was discussed with the patient who expressed understanding.    Ritta Slot, MD [facility] [phone]  If 7pm- 7am, please page neurology on call as listed in AMION.

## 2014-07-30 NOTE — Discharge Instructions (Signed)
It was a pleasure taking care of you, Mr. Sitar.  You will be started on a medication called Levetiracetam (Keppra) 500 mg twice a day to control your seizures. I have provided some information about this medication for you below. You will also need to follow up with a Neurologist at [facility] as an outpatient. You will be contacted about a date for an appointment. Also, please follow up with orthopedic surgery in regards to your left shoulder.  Levetiracetam tablets What is this medicine? LEVETIRACETAM (lee ve tye RA se tam) is an antiepileptic drug. It is used with other medicines to treat certain types of seizures. This medicine may be used for other purposes; ask your health care provider or pharmacist if you have questions. COMMON BRAND NAME(S): Keppra What should I tell my health care provider before I take this medicine? They need to know if you have any of these conditions: -kidney disease -suicidal thoughts, plans, or attempt; a previous suicide attempt by you or a family member -an unusual or allergic reaction to levetiracetam, other medicines, foods, dyes, or preservatives -pregnant or trying to get pregnant -breast-feeding How should I use this medicine? Take this medicine by mouth with a glass of water. Follow the directions on the prescription label. Swallow the tablets whole. Do not crush or chew this medicine. You may take this medicine with or without food. Take your doses at regular intervals. Do not take your medicine more often than directed. Do not stop taking this medicine or any of your seizure medicines unless instructed by your doctor or health care professional. Stopping your medicine suddenly can increase your seizures or their severity. A special MedGuide will be given to you by the pharmacist with each prescription and refill. Be sure to read this information carefully each time. Contact your pediatrician or health care professional regarding the use of this  medication in children. While this drug may be prescribed for children as young as 68 years of age for selected conditions, precautions do apply. Overdosage: If you think you have taken too much of this medicine contact a poison control center or emergency room at once. NOTE: This medicine is only for you. Do not share this medicine with others. What if I miss a dose? If you miss a dose, take it as soon as you can. If it is almost time for your next dose, take only that dose. Do not take double or extra doses. What may interact with this medicine? This medicine may interact with the following medications: -carbamazepine -colesevelam -probenecid -sevelamer This list may not describe all possible interactions. Give your health care provider a list of all the medicines, herbs, non-prescription drugs, or dietary supplements you use. Also tell them if you smoke, drink alcohol, or use illegal drugs. Some items may interact with your medicine. What should I watch for while using this medicine? Visit your doctor or health care professional for a regular check on your progress. Wear a medical identification bracelet or chain to say you have epilepsy, and carry a card that lists all your medications. It is important to take this medicine exactly as instructed by your health care professional. When first starting treatment, your dose may need to be adjusted. It may take weeks or months before your dose is stable. You should contact your doctor or health care professional if your seizures get worse or if you have any new types of seizures. You may get drowsy or dizzy. Do not drive, use machinery, or  do anything that needs mental alertness until you know how this medicine affects you. Do not stand or sit up quickly, especially if you are an older patient. This reduces the risk of dizzy or fainting spells. Alcohol may interfere with the effect of this medicine. Avoid alcoholic drinks. The use of this medicine may  increase the chance of suicidal thoughts or actions. Pay special attention to how you are responding while on this medicine. Any worsening of mood, or thoughts of suicide or dying should be reported to your health care professional right away. Women who become pregnant while using this medicine may enroll in the [place] Antiepileptic Drug Pregnancy Registry by calling [phone]. This registry collects information about the safety of antiepileptic drug use during pregnancy. What side effects may I notice from receiving this medicine? Side effects you should report to your doctor or health care professional as soon as possible: -allergic reactions like skin rash, itching or hives, swelling of the face, lips, or tongue -breathing problems -dark urine -general ill feeling or flu-like symptoms -problems with balance, talking, walking -unusually weak or tired -worsening of mood, thoughts or actions of suicide or dying -yellowing of the eyes or skin Side effects that usually do not require medical attention (report to your doctor or health care professional if they continue or are bothersome): -diarrhea -dizzy, drowsy -headache -loss of appetite This list may not describe all possible side effects. Call your doctor for medical advice about side effects. You may report side effects to FDA at 1-800-FDA-1088. Where should I keep my medicine? Keep out of reach of children. Store at room temperature between 15 and 30 degrees C (59 and 86 degrees F). Throw away any unused medicine after the expiration date. NOTE: This sheet is a summary. It may not cover all possible information. If you have questions about this medicine, talk to your doctor, pharmacist, or health care provider.  2015, [employer]vier/[employer] Standard. (2013-10-03 08:42:48)

## 2014-07-30 NOTE — Progress Notes (Signed)
Routine adult EEG completed, results pending. 

## 2014-07-30 NOTE — Progress Notes (Signed)
UR Completed Serene Kopf Graves-Bigelow, RN,BSN [phone]  
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
## 2014-07-30 NOTE — Procedures (Signed)
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 One channel was dedicated to EKG recording.    Background: The background consists of intermixed alpha and beta activities. here is a well defined posterior dominant rhythm of 10 Hz that attenuates with eye opening. He has a mild increase in delta with drowsiness and sleep spindles are observed.  Photic stimulation: Physiologic driving is present  EEG Abnormalities: None  Clinical Interpretation: This normal EEG is recorded in the waking and sleep state. There was no seizure or seizure predisposition recorded on this study.   Ritta Slot, MD [facility] [phone]  If 7pm- 7am, please page neurology on call as listed in AMION.

## 2014-07-31 NOTE — Consult Note (Signed)
Agree with above No need for acute surgical intervention Can f/u with my partner Dr Zachery Dauer in 2 weeks for ongoing care Will sign-off

## 2014-08-16 ENCOUNTER — Ambulatory Visit: Payer: Self-pay | Admitting: Neurology

## 2014-08-16 ENCOUNTER — Encounter: Payer: Self-pay | Admitting: *Deleted

## 2014-08-16 ENCOUNTER — Telehealth: Payer: Self-pay | Admitting: Neurology

## 2014-08-16 NOTE — Telephone Encounter (Signed)
Pt no showed today's appt. ER referral to Dr. Karel Jarvis.  Wayne Hawkins - please mail pt a no show letter / Wayne Hawkins.

## 2014-08-16 NOTE — Progress Notes (Signed)
No show letter sent for 08-15-14

## 2014-08-19 ENCOUNTER — Telehealth (HOSPITAL_BASED_OUTPATIENT_CLINIC_OR_DEPARTMENT_OTHER): Payer: Self-pay | Admitting: Emergency Medicine

## 2015-07-29 ENCOUNTER — Emergency Department (HOSPITAL_COMMUNITY)
Admission: EM | Admit: 2015-07-29 | Discharge: 2015-07-29 | Discharge status: Against Medical Advice | Payer: Self-pay | Attending: Emergency Medicine | Admitting: Emergency Medicine

## 2015-07-29 ENCOUNTER — Encounter (HOSPITAL_COMMUNITY): Payer: Self-pay | Admitting: Emergency Medicine

## 2015-07-29 DIAGNOSIS — G40909 Epilepsy, unspecified, not intractable, without status epilepticus: Secondary | ICD-10-CM | POA: Insufficient documentation

## 2015-07-29 DIAGNOSIS — J45909 Unspecified asthma, uncomplicated: Secondary | ICD-10-CM | POA: Insufficient documentation

## 2015-07-29 LAB — BASIC METABOLIC PANEL
ANION GAP: 10 (ref 5–15)
BUN: 10 mg/dL (ref 6–20)
CO2: 23 mmol/L (ref 22–32)
Calcium: 9 mg/dL (ref 8.9–10.3)
Chloride: 104 mmol/L (ref 101–111)
Creatinine, Ser: 1.2 mg/dL (ref 0.61–1.24)
Glucose, Bld: 147 mg/dL — ABNORMAL HIGH (ref 65–99)
Potassium: 3.8 mmol/L (ref 3.5–5.1)
SODIUM: 137 mmol/L (ref 135–145)

## 2015-07-29 LAB — CBG MONITORING, ED: GLUCOSE-CAPILLARY: 120 mg/dL — AB (ref 65–99)

## 2015-07-29 NOTE — Discharge Instructions (Signed)
° °  You have elected to leave AGAINST MEDICAL ADVICE. The risks associated with this are recurrent seizures, permanent neurological impairment and even death. It is important to follow up with neurology for reevaluation and possible antiseizure medications. Return to ED for worsening symptoms.  Driving and Equipment Restrictions Some medical problems make it dangerous to drive, ride a bike, or use machines. Some of these problems are:  A hard blow to the head (concussion).  Passing out (fainting).  Twitching and shaking (seizures).  Low blood sugar.  Taking medicine to help you relax (sedatives).  Taking pain medicines.  Wearing an eye patch.  Wearing splints. This can make it hard to use parts of your body that you need to drive safely. HOME CARE   Do not drive until your doctor says it is okay.  Do not use machines until your doctor says it is okay. You may need a form signed by your doctor (medical release) before you can drive again. You may also need this form before you do other tasks where you need to be fully alert. MAKE SURE YOU:  Understand these instructions.  Will watch your condition.  Will get help right away if you are not doing well or get worse. Document Released: 12/17/2004 Document Revised: 02/01/2012 Document Reviewed: 03/19/2010 [facility] Patient Information 2015 [place], [place]. This information is not intended to replace advice given to you by your health care provider. Make sure you discuss any questions you have with your health care provider.  Epilepsy People with epilepsy have times when they shake and jerk uncontrollably (seizures). This happens when there is a sudden change in brain function. Epilepsy may have many possible causes. Anything that disturbs the normal pattern of brain cell activity can lead to seizures. HOME CARE   Follow your doctor's instructions about driving and safety during normal activities.  Get enough sleep.  Only take  medicine as told by your doctor.  Avoid things that you know can cause you to have seizures (triggers).  Write down when your seizures happen and what you remember about each seizure. Write down anything you think may have caused the seizure to happen.  Tell the people you live and work with that you have seizures. Make sure they know how to help you. They should:  Cushion your head and body.  Turn you on your side.  Not restrain you.  Not place anything inside your mouth.  Call for local emergency medical help if there is any question about what has happened.  Keep all follow-up visits with your doctor. This is very important. GET HELP IF:  You get an infection or start to feel sick. You may have more seizures when you are sick.  You are having seizures more often.  Your seizure pattern is changing. GET HELP RIGHT AWAY IF:   A seizure does not stop after a few seconds or minutes.  A seizure causes you to have trouble breathing.  A seizure gives you a very bad headache.  A seizure makes you unable to speak or use a part of your body. Document Released: 09/06/2009 Document Revised: 08/30/2013 Document Reviewed: 06/21/2013 [facility] Patient Information 2015 [place], [place]. This information is not intended to replace advice given to you by your health care provider. Make sure you discuss any questions you have with your health care provider.

## 2015-07-29 NOTE — ED Provider Notes (Signed)
CSN: [id number]     Arrival date & time 07/29/15  0714 History   First MD Initiated Contact with Patient 07/29/15 [phone]     Chief Complaint  Patient presents with  . Seizures     (Consider location/radiation/quality/duration/timing/severity/associated sxs/prior Treatment) HPI Wayne Hawkins is a 44 y.o. male with history of asthma comes in for evaluation of seizures. Patient states he has had 3 seizures "my entire life". He reports the last seizure he had was "one year ago today". He does not take any antiseizure medications. He does report marijuana use tonight prior to the seizure. His roommate also smoked the same marijuana last night and remains asymptomatic. Remainder reports he walked in to the patient's room at approximately 6:45 AM and noticed he was "convulsing and was white around the mouth". He reports seizure activity lasted for approximately 15 minutes and resolved spontaneously. Patient was originally confused, but is increasingly more alert and is now baseline in the ED. Patient denies any fevers, chills, intraoral trauma, loss of bowel or bladder function. Patient does report his muscles are sore. Denies any other symptoms in the ED.  Past Medical History  Diagnosis Date  . Asthma    History reviewed. No pertinent past surgical history. No family history on file. Social History  Substance Use Topics  . Smoking status: Never Smoker   . Smokeless tobacco: None  . Alcohol Use: No    Review of Systems A 10 point review of systems was completed and was negative except for pertinent positives and negatives as mentioned in the history of present illness     Allergies  Review of patient's allergies indicates no known allergies.  Home Medications   Prior to Admission medications   Medication Sig Start Date End Date Taking? Authorizing Provider  diphenhydrAMINE (BENADRYL) 25 MG tablet Take 25 mg by mouth every 6 (six) hours as needed for allergies.   Yes Historical Provider,  MD  ibuprofen (ADVIL,MOTRIN) 600 MG tablet Take 1 tablet (600 mg total) by mouth every 6 (six) hours as needed for moderate pain. 07/30/14  Yes Carly J Rivet, MD   BP 122/73 mmHg  Pulse 80  Temp(Src) 97.8 F (36.6 C) (Oral)  Resp 20  SpO2 97% Physical Exam  Constitutional: He is oriented to person, place, and time. He appears well-developed and well-nourished.  HENT:  Head: Normocephalic and atraumatic.  Mouth/Throat: Oropharynx is clear and moist.  Eyes: Conjunctivae are normal. Pupils are equal, round, and reactive to light. Right eye exhibits no discharge. Left eye exhibits no discharge. No scleral icterus.  Neck: Neck supple.  Cardiovascular: Normal rate, regular rhythm and normal heart sounds.   Pulmonary/Chest: Effort normal and breath sounds normal. No respiratory distress. He has no wheezes. He has no rales.  Abdominal: Soft. There is no tenderness.  Musculoskeletal: He exhibits no tenderness.  Neurological: He is alert and oriented to person, place, and time.  Cranial Nerves II-XII grossly intact  Skin: Skin is warm and dry. No rash noted.  Psychiatric: He has a normal mood and affect.  Patient is alert and oriented 4, GCS 15. Acting baseline per roommate at bedside. Cranial nerves II through XII are grossly intact. Motor and sensation 5/5 in all 4 extremities. Completes coordination movements without difficulty. No focal neurodeficits. No evidence of postictal state.  Nursing note and vitals reviewed.   ED Course  Procedures (including critical care time) Labs Review Labs Reviewed  BASIC METABOLIC PANEL - Abnormal; Notable for the following:  Glucose, Bld 147 (*)    All other components within normal limits  CBG MONITORING, ED - Abnormal; Notable for the following:    Glucose-Capillary 120 (*)    All other components within normal limits    Imaging Review No results found. I have personally reviewed and evaluated these images and lab results as part of my medical  decision-making.   EKG Interpretation   Date/Time:  Monday July 29 2015 07:18:31 EDT Ventricular Rate:  86 PR Interval:  122 QRS Duration: 79 QT Interval:  372 QTC Calculation: 445 R Axis:   73 Text Interpretation:  Sinus rhythm Confirmed by Lincoln Brigham [phone]) on  07/29/2015 7:27:38 AM      MDM  Discussed with patient will need to consult neurology and patient may need to start on antiseizure medications. Patient refuses taking medications and does not want neurology consultation. Discussed this would result in being discharged AGAINST MEDICAL ADVICE. Patient understands risks associated. Discussed with him the risks of this decision including recurrent seizure, permanent neurological dysfunction, death. Discussed further he does not need to drive, operate machinery, cook along with other activities that could result in self-harm or harm to other people. Patient is at baseline and I believe he is of sound mind to make this decision. Final diagnoses:  Seizure disorder       Joycie Peek, PA-C 07/29/15 1110  Tilden Fossa, MD 07/29/15 [phone]

## 2015-07-29 NOTE — ED Notes (Signed)
From home via EMS, witnessed seizure, unknown duration, post-ictal but improving mental status on arrival, VSS, CBG 146, c/o back pain - 2 previous seizures

## 2015-08-16 ENCOUNTER — Emergency Department (HOSPITAL_COMMUNITY): Payer: Self-pay

## 2015-08-16 ENCOUNTER — Emergency Department (HOSPITAL_COMMUNITY)
Admission: EM | Admit: 2015-08-16 | Discharge: 2015-08-16 | Disposition: A | Discharge status: Home / Self Care | Payer: Self-pay | Attending: Emergency Medicine | Admitting: Emergency Medicine

## 2015-08-16 ENCOUNTER — Encounter (HOSPITAL_COMMUNITY): Payer: Self-pay | Admitting: Emergency Medicine

## 2015-08-16 DIAGNOSIS — M25511 Pain in right shoulder: Secondary | ICD-10-CM | POA: Insufficient documentation

## 2015-08-16 DIAGNOSIS — J45909 Unspecified asthma, uncomplicated: Secondary | ICD-10-CM | POA: Insufficient documentation

## 2015-08-16 DIAGNOSIS — M546 Pain in thoracic spine: Secondary | ICD-10-CM | POA: Insufficient documentation

## 2015-08-16 DIAGNOSIS — R112 Nausea with vomiting, unspecified: Secondary | ICD-10-CM | POA: Insufficient documentation

## 2015-08-16 DIAGNOSIS — R569 Unspecified convulsions: Secondary | ICD-10-CM | POA: Insufficient documentation

## 2015-08-16 DIAGNOSIS — G40909 Epilepsy, unspecified, not intractable, without status epilepticus: Secondary | ICD-10-CM

## 2015-08-16 DIAGNOSIS — M549 Dorsalgia, unspecified: Secondary | ICD-10-CM

## 2015-08-16 LAB — COMPREHENSIVE METABOLIC PANEL
ALBUMIN: 4.6 g/dL (ref 3.5–5.0)
ALT: 19 U/L (ref 17–63)
AST: 30 U/L (ref 15–41)
Alkaline Phosphatase: 50 U/L (ref 38–126)
Anion gap: 12 (ref 5–15)
BUN: 11 mg/dL (ref 6–20)
CHLORIDE: 101 mmol/L (ref 101–111)
CO2: 22 mmol/L (ref 22–32)
Calcium: 9.6 mg/dL (ref 8.9–10.3)
Creatinine, Ser: 1.11 mg/dL (ref 0.61–1.24)
GFR calc non Af Amer: >60 mL/min (ref >60)
Glucose, Bld: 135 mg/dL — ABNORMAL HIGH (ref 65–99)
Potassium: 4.6 mmol/L (ref 3.5–5.1)
Sodium: 135 mmol/L (ref 135–145)
Total Bilirubin: 2 mg/dL — ABNORMAL HIGH (ref 0.3–1.2)
Total Protein: 8.1 g/dL (ref 6.5–8.1)

## 2015-08-16 LAB — CBC WITH DIFFERENTIAL/PLATELET
Basophils Absolute: 0 10*3/uL (ref 0.0–0.1)
Basophils Relative: 0 %
EOS PCT: 3 %
Eosinophils Absolute: 0.3 10*3/uL (ref 0.0–0.7)
HCT: 47.1 % (ref 39.0–52.0)
HEMOGLOBIN: 16.7 g/dL (ref 13.0–17.0)
LYMPHS ABS: 3.8 10*3/uL (ref 0.7–4.0)
LYMPHS PCT: 33 %
MCH: 32.3 pg (ref 26.0–34.0)
MCHC: 35.5 g/dL (ref 30.0–36.0)
MCV: 91.1 fL (ref 78.0–100.0)
Monocytes Absolute: 1.2 10*3/uL — ABNORMAL HIGH (ref 0.1–1.0)
Monocytes Relative: 11 %
Neutro Abs: 6.1 10*3/uL (ref 1.7–7.7)
Neutrophils Relative %: 53 %
PLATELETS: 319 10*3/uL (ref 150–400)
RBC: 5.17 MIL/uL (ref 4.22–5.81)
RDW: 12.6 % (ref 11.5–15.5)
WBC: 11.4 10*3/uL — ABNORMAL HIGH (ref 4.0–10.5)

## 2015-08-16 LAB — ETHANOL: Alcohol, Ethyl (B): <5 mg/dL (ref <5)

## 2015-08-16 MED ORDER — ONDANSETRON HCL 4 MG/2ML IJ SOLN
4.0000 mg | Freq: Once | INTRAMUSCULAR | Status: AC
  Administered 2015-08-16: 4 mg via INTRAVENOUS
  Filled 2015-08-16: qty 2

## 2015-08-16 MED ORDER — PREDNISONE 20 MG PO TABS: 40.0000 mg | ORAL_TABLET | Freq: Every day | ORAL | Status: DC

## 2015-08-16 MED ORDER — ONDANSETRON HCL 4 MG PO TABS: 4.0000 mg | ORAL_TABLET | Freq: Four times a day (QID) | ORAL | Status: DC | PRN

## 2015-08-16 MED ORDER — SODIUM CHLORIDE 0.9 % IV SOLN
1000.0000 mL | Freq: Once | INTRAVENOUS | Status: AC
  Administered 2015-08-16: 1000 mL via INTRAVENOUS

## 2015-08-16 MED ORDER — KETOROLAC TROMETHAMINE 30 MG/ML IJ SOLN
30.0000 mg | Freq: Once | INTRAMUSCULAR | Status: AC
  Administered 2015-08-16: 30 mg via INTRAVENOUS
  Filled 2015-08-16: qty 1

## 2015-08-16 MED ORDER — OXYCODONE-ACETAMINOPHEN 5-325 MG PO TABS
1.0000 | ORAL_TABLET | Freq: Once | ORAL | Status: AC
  Administered 2015-08-16: 1 via ORAL
  Filled 2015-08-16: qty 1

## 2015-08-16 MED ORDER — SODIUM CHLORIDE 0.9 % IV SOLN
1000.0000 mL | INTRAVENOUS | Status: DC
Start: 2015-08-16 — End: 2015-08-16

## 2015-08-16 MED ORDER — SODIUM CHLORIDE 0.9 % IV SOLN
1000.0000 mg | Freq: Once | INTRAVENOUS | Status: AC
  Administered 2015-08-16: 1000 mg via INTRAVENOUS
  Filled 2015-08-16: qty 10

## 2015-08-16 MED ORDER — LEVETIRACETAM 500 MG PO TABS: 500.0000 mg | ORAL_TABLET | Freq: Two times a day (BID) | ORAL | Status: DC

## 2015-08-16 NOTE — ED Notes (Signed)
Pt d/c to lobby in wheelchair at 1005.

## 2015-08-16 NOTE — ED Provider Notes (Signed)
Patient signed out to me by Dr. Preston Fleeting. Patient noted severe neck and right shoulder pain times several weeks and worse today. CT of neck as well as plain films of the shoulder were negative. Will begin referral to orthopedics  Lorre Nick, MD 08/16/15 [phone]

## 2015-08-16 NOTE — ED Notes (Signed)
Dr Allen at bedside  

## 2015-08-16 NOTE — ED Notes (Signed)
Per pt's visitor, pt had a seizure on Labor day. Per pt's visitor, pt was normal and then had a seizure while sleeping. Pt states that he hasn't slept much since that episode, states that his right shoulder hurts, and is unable to turn his head or look up due to neck pain. Pt is agitated and anxious reports nausea and vomiting for a few days, and states that he can't stop shaking. Pt also reports numbness in his right arm.

## 2015-08-16 NOTE — ED Provider Notes (Signed)
CSN: [id number]     Arrival date & time 08/16/15  0555 History   First MD Initiated Contact with Patient 08/16/15 [phone]     Chief Complaint  Patient presents with  . Altered Mental Status  . Numbness  . Emesis     (Consider location/radiation/quality/duration/timing/severity/associated sxs/prior Treatment) Patient is a 44 y.o. male presenting with altered mental status and vomiting. The history is provided by the patient.  Altered Mental Status Associated symptoms: vomiting   Emesis He has a history of seizures and was in the ED 3 weeks ago following a seizure. He is not sure if he has been having additional seizures since then. He states he does not remember seizures but there are several times when he has a blank in his memory. Tonight, he did have an episode of urinary and fecal incontinence but no bit lip or tongue. He does not recall anything of the event. He has been having problems with pain in his right shoulder and numbness in his right arm since his seizure 3 weeks ago. He does not take any medication for seizures. He denies fever and there is no headache. He has been having problems with short-term memory loss since his seizure 3 weeks ago.  Past Medical History  Diagnosis Date  . Asthma    History reviewed. No pertinent past surgical history. History reviewed. No pertinent family history. Social History  Substance Use Topics  . Smoking status: Never Smoker   . Smokeless tobacco: None  . Alcohol Use: No    Review of Systems  Gastrointestinal: Positive for vomiting.  All other systems reviewed and are negative.     Allergies  Review of patient's allergies indicates no known allergies.  Home Medications   Prior to Admission medications   Medication Sig Start Date End Date Taking? Authorizing Niki Payment  diphenhydrAMINE (BENADRYL) 25 MG tablet Take 25 mg by mouth every 6 (six) hours as needed for allergies.    Historical Josilynn Losh, MD  ibuprofen (ADVIL,MOTRIN) 600 MG  tablet Take 1 tablet (600 mg total) by mouth every 6 (six) hours as needed for moderate pain. 07/30/14   Carly J Rivet, MD   BP 197/85 mmHg  Temp(Src) 97.9 F (36.6 C) (Oral)  Resp 21  Ht 6' (1.829 m)  Wt 175 lb (79.379 kg)  BMI 23.73 kg/m2  SpO2 96% Physical Exam  Nursing note and vitals reviewed.  44 year old male, resting comfortably and in no acute distress. Vital signs are significant for hypertension and borderline tachypnea. Oxygen saturation is 96%, which is normal. Head is normocephalic and atraumatic. PERRLA, EOMI. Oropharynx is clear. Fundi show no hemorrhage, exudate, or papilledema. Neck is nontender and supple without adenopathy or JVD. Back has localized tenderness in the left side of the upper back, adjacent to the spine, but no midline tenderness. There is no CVA tenderness. Lungs are clear without rales, wheezes, or rhonchi. Chest is nontender. Heart has regular rate and rhythm without murmur. Abdomen is soft, flat, nontender without masses or hepatosplenomegaly and peristalsis is normoactive. Extremities have no cyanosis or edema, full range of motion is present. There is no tenderness to palpation in the right shoulder and full passive range of motion is present. Skin is warm and dry without rash. Neurologic: Mental status is normal, cranial nerves are intact, there are no motor deficits. There is subjective decreased sensation in the right hand with normal sensation beginning in the mid forearm.  ED Course  Procedures (including critical care time) Labs  Review Results for orders placed or performed during the hospital encounter of 08/16/15  Comprehensive metabolic panel  Result Value Ref Range   Sodium 135 135 - 145 mmol/L   Potassium 4.6 3.5 - 5.1 mmol/L   Chloride 101 101 - 111 mmol/L   CO2 22 22 - 32 mmol/L   Glucose, Bld 135 (H) 65 - 99 mg/dL   BUN 11 6 - 20 mg/dL   Creatinine, Ser 1.61 0.61 - 1.24 mg/dL   Calcium 9.6 8.9 - 09.6 mg/dL   Total Protein 8.1  6.5 - 8.1 g/dL   Albumin 4.6 3.5 - 5.0 g/dL   AST 30 15 - 41 U/L   ALT 19 17 - 63 U/L   Alkaline Phosphatase 50 38 - 126 U/L   Total Bilirubin 2.0 (H) 0.3 - 1.2 mg/dL   GFR calc non Af Amer >60 >60 mL/min   GFR calc Af Amer >60 >60 mL/min   Anion gap 12 5 - 15  CBC with Differential  Result Value Ref Range   WBC 11.4 (H) 4.0 - 10.5 K/uL   RBC 5.17 4.22 - 5.81 MIL/uL   Hemoglobin 16.7 13.0 - 17.0 g/dL   HCT 04.5 40.9 - 81.1 %   MCV 91.1 78.0 - 100.0 fL   MCH 32.3 26.0 - 34.0 pg   MCHC 35.5 30.0 - 36.0 g/dL   RDW 91.4 78.2 - 95.6 %   Platelets 319 150 - 400 K/uL   Neutrophils Relative % 53 %   Neutro Abs 6.1 1.7 - 7.7 K/uL   Lymphocytes Relative 33 %   Lymphs Abs 3.8 0.7 - 4.0 K/uL   Monocytes Relative 11 %   Monocytes Absolute 1.2 (H) 0.1 - 1.0 K/uL   Eosinophils Relative 3 %   Eosinophils Absolute 0.3 0.0 - 0.7 K/uL   Basophils Relative 0 %   Basophils Absolute 0.0 0.0 - 0.1 K/uL   Imaging Review Ct Head Wo Contrast  08/16/2015   CLINICAL DATA:  Seizures on Labor Day. Altered mental status. Neck pain. Nausea and vomiting for a few days.  EXAM: CT HEAD WITHOUT CONTRAST  TECHNIQUE: Contiguous axial images were obtained from the base of the skull through the vertex without intravenous contrast.  COMPARISON:  MRI brain 07/29/2014.  CT head 07/28/2014.  FINDINGS: Ventricles and sulci appear symmetrical. No ventricular dilatation. No mass effect or midline shift. No abnormal extra-axial fluid collections. Gray-white matter junctions are distinct. Basal cisterns are not effaced. No evidence of acute intracranial hemorrhage. No depressed skull fractures. Opacification of right maxillary antrum and multiple ethmoid air cells. Mucosal thickening in the remaining paranasal sinuses. No acute air-fluid levels. Mastoid air cells are not opacified.  IMPRESSION: No acute intracranial abnormalities. Chronic appearing inflammatory changes in the paranasal sinuses similar to previous study.    Electronically Signed   By: Burman Nieves M.D.   On: 08/16/2015 06:48   I have personally reviewed and evaluated these images and lab results as part of my medical decision-making.    MDM   Final diagnoses:  Seizure disorder  Nausea and vomiting, vomiting of unspecified type  Pain in right shoulder  Pain, upper back    Seizure disorder. Neck pain and shoulder pain appear to be musculoskeletal, and I do not feel they need specific imaging. Old records are reviewed and he had left the ED AMA before any workup can be done when he was seen 3 weeks ago. He had been discharged from the hospital following seizures  one year ago and had been given a prescription for levetiracetam. Patient admits that he never had the prescription filled. He is given a loading dose of levetiracetam and screening labs are ordered. Given mental status changes over the last several weeks, head CT will be repeated.  Head CT and laboratory evaluation are unremarkable. He will be discharged with prescription for levetiracetam and ondansetron, and advised to use over-the-counter NSAIDs as needed for his musculoskeletal pain. He is referred to [facility] neurology for follow-up.    Dione Booze, MD 08/16/15 [phone]

## 2015-08-16 NOTE — ED Notes (Signed)
This RN went into pt room to assist pt to be ready for d/c. Pt states "that doctor never did an MRI and he said he would do an MRI. I want to file a complaint against Dr. Freida Busman." This RN witnessed initial conversation (at [phone]) between Dr. Freida Busman and pt where Dr. Freida Busman explained CT spine and basic x-ray would be done in regards to right shoulder pain and upper right back pain. Pt had been agitated and was not allowing Dr. Freida Busman to complete sentences, interrupting multiple times. This RN explained that the plan had been for CT spine and basic x-ray. Pt becoming increasingly more agitated toward this RN, stating "I'll just go somewhere else to get the tests that need to be done." Pt then requested ginger ale and this RN brought pt ginger ale. This RN returned, removed IV, took last set of vital signs. Pt stated "I greatly appreciate the care that you all give here. Thank you for all that you do." Pt then stated that he had trouble earlier today getting changed and would need assistance. This RN released side rails, unhooked pt from monitors, and pt moved to sitting position on side of bed.  This RN stated that would return with discharge papers and that pt would be able to get changed. Pt stated "Okay, sounds good" and began to reach for clothes. This RN left room, picked up d/c papers, returned to room. Pt was sitting in chair and was fully dressed. Pt then states "you didn't help me change and I told you to help me change, so I fell when I stood up from bed." Charge RN notified, EDP aware. Pt then begins ambulating in room. This RN encouraged pt to sit down. Assessed pt and no new complaints from pt. Pt willing to go over d/c papers at this time. This RN gave detailed explanation of d/c instructions, prescriptions, and follow-up care. Reminded pt not to drive until seen by his neurologist. Encouraged pt to follow up w/ orthopedist if shoulder/back pain did not resolve. Pt has no questions about d/c papers at this  time. Pt verbalized understanding of d/c papers but unwilling to sign at this time, stating "I want [name] to see these papers before I sign." This RN explained pt can wait in waiting room, pt consented. Dr. Freida Busman (EDP) and Charge RN Shanda Bumps B) made aware of situation. Pt then states he has complaints to make regarding this RN and EDP, as well as care provided here. Pt given Patient Experience number to file complaints by Charge RN Shanda Bumps B). Pt escorted to lobby in wheelchair by this RN.

## 2015-08-16 NOTE — Discharge Instructions (Signed)
Take ibuprofen and acetaminophen as needed for pain. Apply ice several times a day.  Take the Keppra every day - if you, don't you will have more seizures.  Seizure, Adult A seizure is abnormal electrical activity in the brain. Seizures usually last from 30 seconds to 2 minutes. There are various types of seizures. Before a seizure, you may have a warning sensation (aura) that a seizure is about to occur. An aura may include the following symptoms:   Fear or anxiety.  Nausea.  Feeling like the room is spinning (vertigo).  Vision changes, such as seeing flashing lights or spots. Common symptoms during a seizure include:  A change in attention or behavior (altered mental status).  Convulsions with rhythmic jerking movements.  Drooling.  Rapid eye movements.  Grunting.  Loss of bladder and bowel control.  Bitter taste in the mouth.  Tongue biting. After a seizure, you may feel confused and sleepy. You may also have an injury resulting from convulsions during the seizure. HOME CARE INSTRUCTIONS   If you are given medicines, take them exactly as prescribed by your health care provider.  Keep all follow-up appointments as directed by your health care provider.  Do not swim or drive or engage in risky activity during which a seizure could cause further injury to you or others until your health care provider says it is OK.  Get adequate rest.  Teach friends and family what to do if you have a seizure. They should:  Lay you on the ground to prevent a fall.  Put a cushion under your head.  Loosen any tight clothing around your neck.  Turn you on your side. If vomiting occurs, this helps keep your airway clear.  Stay with you until you recover.  Know whether or not you need emergency care. SEEK IMMEDIATE MEDICAL CARE IF:  The seizure lasts longer than 5 minutes.  The seizure is severe or you do not wake up immediately after the seizure.  You have an altered mental  status after the seizure.  You are having more frequent or worsening seizures. Someone should drive you to the emergency department or call local emergency services (911 in [place] MAKE SURE YOU:  Understand these instructions.  Will watch your condition.  Will get help right away if you are not doing well or get worse. Document Released: 11/06/2000 Document Revised: 08/30/2013 Document Reviewed: 06/21/2013 [facility] Patient Information 2015 [place], [place]. This information is not intended to replace advice given to you by your health care provider. Make sure you discuss any questions you have with your health care provider.  Levetiracetam tablets What is this medicine? LEVETIRACETAM (lee ve tye RA se tam) is an antiepileptic drug. It is used with other medicines to treat certain types of seizures. This medicine may be used for other purposes; ask your health care provider or pharmacist if you have questions. COMMON BRAND NAME(S): Keppra What should I tell my health care provider before I take this medicine? They need to know if you have any of these conditions: -kidney disease -suicidal thoughts, plans, or attempt; a previous suicide attempt by you or a family member -an unusual or allergic reaction to levetiracetam, other medicines, foods, dyes, or preservatives -pregnant or trying to get pregnant -breast-feeding How should I use this medicine? Take this medicine by mouth with a glass of water. Follow the directions on the prescription label. Swallow the tablets whole. Do not crush or chew this medicine. You may take this medicine with  or without food. Take your doses at regular intervals. Do not take your medicine more often than directed. Do not stop taking this medicine or any of your seizure medicines unless instructed by your doctor or health care professional. Stopping your medicine suddenly can increase your seizures or their severity. A special MedGuide will be given to you by  the pharmacist with each prescription and refill. Be sure to read this information carefully each time. Contact your pediatrician or health care professional regarding the use of this medication in children. While this drug may be prescribed for children as young as 72 years of age for selected conditions, precautions do apply. Overdosage: If you think you have taken too much of this medicine contact a poison control center or emergency room at once. NOTE: This medicine is only for you. Do not share this medicine with others. What if I miss a dose? If you miss a dose, take it as soon as you can. If it is almost time for your next dose, take only that dose. Do not take double or extra doses. What may interact with this medicine? This medicine may interact with the following medications: -carbamazepine -colesevelam -probenecid -sevelamer This list may not describe all possible interactions. Give your health care provider a list of all the medicines, herbs, non-prescription drugs, or dietary supplements you use. Also tell them if you smoke, drink alcohol, or use illegal drugs. Some items may interact with your medicine. What should I watch for while using this medicine? Visit your doctor or health care professional for a regular check on your progress. Wear a medical identification bracelet or chain to say you have epilepsy, and carry a card that lists all your medications. It is important to take this medicine exactly as instructed by your health care professional. When first starting treatment, your dose may need to be adjusted. It may take weeks or months before your dose is stable. You should contact your doctor or health care professional if your seizures get worse or if you have any new types of seizures. You may get drowsy or dizzy. Do not drive, use machinery, or do anything that needs mental alertness until you know how this medicine affects you. Do not stand or sit up quickly, especially if you  are an older patient. This reduces the risk of dizzy or fainting spells. Alcohol may interfere with the effect of this medicine. Avoid alcoholic drinks. The use of this medicine may increase the chance of suicidal thoughts or actions. Pay special attention to how you are responding while on this medicine. Any worsening of mood, or thoughts of suicide or dying should be reported to your health care professional right away. Women who become pregnant while using this medicine may enroll in the [place] [employer] Antiepileptic Drug Pregnancy Registry by calling [phone] 238-5092. This registry collects information about the safety of antiepileptic drug use during pregnancy. What side effects may I notice from receiving this medicine? Side effects you should report to your doctor or health care professional as soon as possible: -allergic reactions like skin rash, itching or hives, swelling of the face, lips, or tongue -breathing problems -dark urine -general ill feeling or flu-like symptoms -problems with balance, talking, walking -unusually weak or tired -worsening of mood, thoughts or actions of suicide or dying -yellowing of the eyes or skin Side effects that usually do not require medical attention (report to your doctor or health care professional if they continue or are bothersome): -diarrhea -dizzy, drowsy -headache -loss  of appetite This list may not describe all possible side effects. Call your doctor for medical advice about side effects. You may report side effects to FDA at 1-800-FDA-1088. Where should I keep my medicine? Keep out of reach of children. Store at room temperature between 15 and 30 degrees C (59 and 86 degrees F). Throw away any unused medicine after the expiration date. NOTE: This sheet is a summary. It may not cover all possible information. If you have questions about this medicine, talk to your doctor, pharmacist, or health care provider.  2015, Elsevier/Gold Standard.  (2013-10-03 08:42:48)  Ondansetron tablets What is this medicine? ONDANSETRON (on DAN se tron) is used to treat nausea and vomiting caused by chemotherapy. It is also used to prevent or treat nausea and vomiting after surgery. This medicine may be used for other purposes; ask your health care provider or pharmacist if you have questions. COMMON BRAND NAME(S): Zofran What should I tell my health care provider before I take this medicine? They need to know if you have any of these conditions: -heart disease -history of irregular heartbeat -liver disease -low levels of magnesium or potassium in the blood -an unusual or allergic reaction to ondansetron, granisetron, other medicines, foods, dyes, or preservatives -pregnant or trying to get pregnant -breast-feeding How should I use this medicine? Take this medicine by mouth with a glass of water. Follow the directions on your prescription label. Take your doses at regular intervals. Do not take your medicine more often than directed. Talk to your pediatrician regarding the use of this medicine in children. Special care may be needed. Overdosage: If you think you have taken too much of this medicine contact a poison control center or emergency room at once. NOTE: This medicine is only for you. Do not share this medicine with others. What if I miss a dose? If you miss a dose, take it as soon as you can. If it is almost time for your next dose, take only that dose. Do not take double or extra doses. What may interact with this medicine? Do not take this medicine with any of the following medications: -apomorphine -certain medicines for fungal infections like fluconazole, itraconazole, ketoconazole, posaconazole, voriconazole -cisapride -dofetilide -dronedarone -pimozide -thioridazine -ziprasidone This medicine may also interact with the following medications: -carbamazepine -certain medicines for depression, anxiety, or psychotic  disturbances -fentanyl -linezolid -MAOIs like Carbex, Eldepryl, Marplan, Nardil, and Parnate -methylene blue (injected into a vein) -other medicines that prolong the QT interval (cause an abnormal heart rhythm) -phenytoin -rifampicin -tramadol This list may not describe all possible interactions. Give your health care provider a list of all the medicines, herbs, non-prescription drugs, or dietary supplements you use. Also tell them if you smoke, drink alcohol, or use illegal drugs. Some items may interact with your medicine. What should I watch for while using this medicine? Check with your doctor or health care professional right away if you have any sign of an allergic reaction. What side effects may I notice from receiving this medicine? Side effects that you should report to your doctor or health care professional as soon as possible: -allergic reactions like skin rash, itching or hives, swelling of the face, lips or tongue -breathing problems -confusion -dizziness -fast or irregular heartbeat -feeling faint or lightheaded, falls -fever and chills -loss of balance or coordination -seizures -sweating -swelling of the hands or feet -tightness in the chest -tremors -unusually weak or tired Side effects that usually do not require medical attention (report to  your doctor or health care professional if they continue or are bothersome): -constipation or diarrhea -headache This list may not describe all possible side effects. Call your doctor for medical advice about side effects. You may report side effects to FDA at 1-800-FDA-1088. Where should I keep my medicine? Keep out of the reach of children. Store between 2 and 30 degrees C (36 and 86 degrees F). Throw away any unused medicine after the expiration date. NOTE: This sheet is a summary. It may not cover all possible information. If you have questions about this medicine, talk to your doctor, pharmacist, or health care  provider.  2015, Elsevier/Gold Standard. (2013-08-16 16:27:45)

## 2016-01-05 ENCOUNTER — Emergency Department (HOSPITAL_COMMUNITY)
Admission: EM | Admit: 2016-01-05 | Discharge: 2016-01-05 | Disposition: A | Discharge status: Home / Self Care | Payer: Self-pay | Attending: Emergency Medicine | Admitting: Emergency Medicine

## 2016-01-05 ENCOUNTER — Encounter (HOSPITAL_COMMUNITY): Payer: Self-pay

## 2016-01-05 DIAGNOSIS — K047 Periapical abscess without sinus: Secondary | ICD-10-CM | POA: Insufficient documentation

## 2016-01-05 DIAGNOSIS — Z9114 Patient's other noncompliance with medication regimen: Secondary | ICD-10-CM

## 2016-01-05 DIAGNOSIS — Z79899 Other long term (current) drug therapy: Secondary | ICD-10-CM | POA: Insufficient documentation

## 2016-01-05 DIAGNOSIS — K029 Dental caries, unspecified: Secondary | ICD-10-CM | POA: Insufficient documentation

## 2016-01-05 DIAGNOSIS — F1310 Sedative, hypnotic or anxiolytic abuse, uncomplicated: Secondary | ICD-10-CM | POA: Insufficient documentation

## 2016-01-05 DIAGNOSIS — J45909 Unspecified asthma, uncomplicated: Secondary | ICD-10-CM | POA: Insufficient documentation

## 2016-01-05 DIAGNOSIS — Z792 Long term (current) use of antibiotics: Secondary | ICD-10-CM | POA: Insufficient documentation

## 2016-01-05 DIAGNOSIS — F1210 Cannabis abuse, uncomplicated: Secondary | ICD-10-CM | POA: Insufficient documentation

## 2016-01-05 DIAGNOSIS — F1110 Opioid abuse, uncomplicated: Secondary | ICD-10-CM | POA: Insufficient documentation

## 2016-01-05 DIAGNOSIS — Z9119 Patient's noncompliance with other medical treatment and regimen: Secondary | ICD-10-CM | POA: Insufficient documentation

## 2016-01-05 LAB — CBC WITH DIFFERENTIAL/PLATELET
BASOS ABS: 0 10*3/uL (ref 0.0–0.1)
BASOS PCT: 0 %
EOS PCT: 0 %
Eosinophils Absolute: 0 10*3/uL (ref 0.0–0.7)
HEMATOCRIT: 47.8 % (ref 39.0–52.0)
Hemoglobin: 16.5 g/dL (ref 13.0–17.0)
Lymphocytes Relative: 16 %
Lymphs Abs: 1.8 10*3/uL (ref 0.7–4.0)
MCH: 31.4 pg (ref 26.0–34.0)
MCHC: 34.5 g/dL (ref 30.0–36.0)
MCV: 90.9 fL (ref 78.0–100.0)
MONO ABS: 0.9 10*3/uL (ref 0.1–1.0)
Monocytes Relative: 8 %
NEUTROS ABS: 8.5 10*3/uL — AB (ref 1.7–7.7)
Neutrophils Relative %: 76 %
PLATELETS: 328 10*3/uL (ref 150–400)
RBC: 5.26 MIL/uL (ref 4.22–5.81)
RDW: 12.7 % (ref 11.5–15.5)
WBC: 11.3 10*3/uL — ABNORMAL HIGH (ref 4.0–10.5)

## 2016-01-05 LAB — BASIC METABOLIC PANEL
ANION GAP: 11 (ref 5–15)
BUN: 11 mg/dL (ref 6–20)
CALCIUM: 9.6 mg/dL (ref 8.9–10.3)
CO2: 22 mmol/L (ref 22–32)
Chloride: 106 mmol/L (ref 101–111)
Creatinine, Ser: 1.1 mg/dL (ref 0.61–1.24)
Glucose, Bld: 113 mg/dL — ABNORMAL HIGH (ref 65–99)
Potassium: 3.7 mmol/L (ref 3.5–5.1)
SODIUM: 139 mmol/L (ref 135–145)

## 2016-01-05 LAB — RAPID URINE DRUG SCREEN, HOSP PERFORMED
Amphetamines: NOT DETECTED
BENZODIAZEPINES: POSITIVE — AB
Barbiturates: NOT DETECTED
COCAINE: NOT DETECTED
Opiates: POSITIVE — AB
Tetrahydrocannabinol: POSITIVE — AB

## 2016-01-05 LAB — I-STAT CG4 LACTIC ACID, ED: Lactic Acid, Venous: 1.85 mmol/L (ref 0.5–2.0)

## 2016-01-05 MED ORDER — LEVETIRACETAM 500 MG PO TABS: 500.0000 mg | ORAL_TABLET | Freq: Two times a day (BID) | ORAL | Status: DC

## 2016-01-05 MED ORDER — OXYCODONE-ACETAMINOPHEN 5-325 MG PO TABS: 2.0000 | ORAL_TABLET | ORAL | Status: DC | PRN

## 2016-01-05 MED ORDER — HYDROMORPHONE HCL 1 MG/ML IJ SOLN
1.0000 mg | Freq: Once | INTRAMUSCULAR | Status: AC
  Administered 2016-01-05: 1 mg via INTRAVENOUS
  Filled 2016-01-05: qty 1

## 2016-01-05 MED ORDER — ONDANSETRON HCL 4 MG/2ML IJ SOLN
4.0000 mg | Freq: Once | INTRAMUSCULAR | Status: AC
  Administered 2016-01-05: 4 mg via INTRAVENOUS
  Filled 2016-01-05: qty 2

## 2016-01-05 MED ORDER — SODIUM CHLORIDE 0.9 % IV BOLUS (SEPSIS)
1000.0000 mL | Freq: Once | INTRAVENOUS | Status: AC
  Administered 2016-01-05: 1000 mL via INTRAVENOUS

## 2016-01-05 MED ORDER — SODIUM CHLORIDE 0.9 % IV SOLN
1000.0000 mg | Freq: Once | INTRAVENOUS | Status: AC
  Administered 2016-01-05: 1000 mg via INTRAVENOUS
  Filled 2016-01-05: qty 10

## 2016-01-05 MED ORDER — PENICILLIN G BENZATHINE 1200000 UNIT/2ML IM SUSP
1.2000 10*6.[IU] | Freq: Once | INTRAMUSCULAR | Status: AC
  Administered 2016-01-05: 1.2 10*6.[IU] via INTRAMUSCULAR
  Filled 2016-01-05: qty 2

## 2016-01-05 NOTE — ED Notes (Addendum)
Patient transported by [facility] from home.  Patient c/o tooth pain x 5 months - pt was told he needed tooth extraction.  EMS was told by patient's roommate that patient was on floor with "arm tensed up" and EMS was called for possible seizure like activity.  EMS reports that patient was alert and oriented upon their arrival.  Patient does have hx of seizures - states he has them once a year when he "has an infection".   Upon interviewing patient, patient states he does not recall what happened this morning.  He believes he had a seizure and remembers "waking up in the ambulance".  EMS reported patient was awake and alert upon arrival to patient's home.  Patient states he was diagnosed with a staph infection in his mouth and has been taking PCN for the last several days.  Patient requesting pain medication at this time.  Patient also c/o nausea.

## 2016-01-05 NOTE — Discharge Instructions (Signed)
Dental Abscess A dental abscess is pus in or around a tooth. HOME CARE  Take medicines only as told by your dentist.  If you were prescribed antibiotic medicine, finish all of it even if you start to feel better.  Rinse your mouth (gargle) often with salt water.  Do not drive or use heavy machinery, like a lawn mower, while taking pain medicine.  Do not apply heat to the outside of your mouth.  Keep all follow-up visits as told by your dentist. This is important. GET HELP IF:  Your pain is worse, and medicine does not help. GET HELP RIGHT AWAY IF:  You have a fever or chills.  Your symptoms suddenly get worse.  You have a very bad headache.  You have problems breathing or swallowing.  You have trouble opening your mouth.  You have puffiness (swelling) in your neck or around your eye.   This information is not intended to replace advice given to you by your health care provider. Make sure you discuss any questions you have with your health care provider.   Document Released: 03/26/2015 Document Reviewed: 03/26/2015 Elsevier Interactive Patient Education [employer] Epilepsy Epilepsy is a disorder in which a person has repeated seizures over time. A seizure is a release of abnormal electrical activity in the brain. Seizures can cause a change in attention, behavior, or the ability to remain awake and alert (altered mental status). Seizures often involve uncontrollable shaking (convulsions).  Most people with epilepsy lead normal lives. However, people with epilepsy are at an increased risk of falls, accidents, and injuries. Therefore, it is important to begin treatment right away. CAUSES  Epilepsy has many possible causes. Anything that disturbs the normal pattern of brain cell activity can lead to seizures. This may include:   Head injury.  Birth trauma.  High fever as a child.  Stroke.  Bleeding into or around the brain.  Certain drugs.  Prolonged low  oxygen, such as what occurs after CPR efforts.  Abnormal brain development.  Certain illnesses, such as meningitis, encephalitis (brain infection), malaria, and other infections.  An imbalance of nerve signaling chemicals (neurotransmitters).  SIGNS AND SYMPTOMS  The symptoms of a seizure can vary greatly from one person to another. Right before a seizure, you may have a warning (aura) that a seizure is about to occur. An aura may include the following symptoms:  Fear or anxiety.  Nausea.  Feeling like the room is spinning (vertigo).  Vision changes, such as seeing flashing lights or spots. Common symptoms during a seizure include:  Abnormal sensations, such as an abnormal smell or a bitter taste in the mouth.   Sudden, general body stiffness.   Convulsions that involve rhythmic jerking of the face, arm, or leg on one or both sides.   Sudden change in consciousness.   Appearing to be awake but not responding.   Appearing to be asleep but cannot be awakened.   Grimacing, chewing, lip smacking, drooling, tongue biting, or loss of bowel or bladder control. After a seizure, you may feel sleepy for a while. DIAGNOSIS  Your health care provider will ask about your symptoms and take a medical history. Descriptions from any witnesses to your seizures will be very helpful in the diagnosis. A physical exam, including a detailed neurological exam, is necessary. Various tests may be done, such as:   An electroencephalogram (EEG). This is a painless test of your brain waves. In this test, a diagram is created of your  brain waves. These diagrams can be interpreted by a specialist.  An MRI of the brain.   A CT scan of the brain.   A spinal tap (lumbar puncture, LP).  Blood tests to check for signs of infection or abnormal blood chemistry. TREATMENT  There is no cure for epilepsy, but it is generally treatable. Once epilepsy is diagnosed, it is important to begin treatment as  soon as possible. For most people with epilepsy, seizures can be controlled with medicines. The following may also be used:  A pacemaker for the brain (vagus nerve stimulator) can be used for people with seizures that are not well controlled by medicine.  Surgery on the brain. For some people, epilepsy eventually goes away. HOME CARE INSTRUCTIONS   Follow your health care provider's recommendations on driving and safety in normal activities.  Get enough rest. Lack of sleep can cause seizures.  Only take over-the-counter or prescription medicines as directed by your health care provider. Take any prescribed medicine exactly as directed.  Avoid any known triggers of your seizures.  Keep a seizure diary. Record what you recall about any seizure, especially any possible trigger.   Make sure the people you live and work with know that you are prone to seizures. They should receive instructions on how to help you. In general, a witness to a seizure should:   Cushion your head and body.   Turn you on your side.   Avoid unnecessarily restraining you.   Not place anything inside your mouth.   Call for emergency medical help if there is any question about what has occurred.   Follow up with your health care provider as directed. You may need regular blood tests to monitor the levels of your medicine.  SEEK MEDICAL CARE IF:   You develop signs of infection or other illness. This might increase the risk of a seizure.   You seem to be having more frequent seizures.   Your seizure pattern is changing.  SEEK IMMEDIATE MEDICAL CARE IF:   You have a seizure that does not stop after a few moments.   You have a seizure that causes any difficulty in breathing.   You have a seizure that results in a very severe headache.   You have a seizure that leaves you with the inability to speak or use a part of your body.    This information is not intended to replace advice given to  you by your health care provider. Make sure you discuss any questions you have with your health care provider.   Document Released: 11/09/2005 Document Revised: 08/30/2013 Document Reviewed: 06/21/2013 Elsevier Interactive Patient Education [employer]

## 2016-01-05 NOTE — ED Notes (Signed)
Bed: WA03 Expected date:  Expected time:  Means of arrival:  Comments: EMS-seizure 

## 2016-01-05 NOTE — ED Provider Notes (Signed)
CSN: [id number]     Arrival date & time 01/05/16  1024 History   First MD Initiated Contact with Patient 01/05/16 1054     Chief Complaint  Patient presents with  . Seizures      HPI Patient transported by [facility] from home. Patient c/o tooth pain x 5 months - pt was told he needed tooth extraction. EMS was told by patient's roommate that patient was on floor with "arm tensed up" and EMS was called for possible seizure like activity. EMS reports that patient was alert and oriented upon their arrival. Patient does have hx of seizures - states he has them once a year when he "has an infection".  Patient states that he is not had Keppra for several months now.  He's been on Keppra in the past.  He also went to [facility] other day for tooth extraction but she told him that his blood pressure was too high.  Patient has no history of hypertension. Past Medical History  Diagnosis Date  . Asthma    History reviewed. No pertinent past surgical history. No family history on file. Social History  Substance Use Topics  . Smoking status: Never Smoker   . Smokeless tobacco: None  . Alcohol Use: No    Review of Systems    Allergies  Review of patient's allergies indicates no known allergies.  Home Medications   Prior to Admission medications   Medication Sig Start Date End Date Taking? Authorizing Provider  diphenhydrAMINE (BENADRYL) 25 MG tablet Take 25 mg by mouth every 6 (six) hours as needed for allergies.   Yes Historical Provider, MD  penicillin v potassium (VEETID) 500 MG tablet Take 500 mg by mouth every 8 (eight) hours.   Yes Historical Provider, MD  levETIRAcetam (KEPPRA) 500 MG tablet Take 1 tablet (500 mg total) by mouth 2 (two) times daily. 01/05/16   Nelva Nay, MD  ondansetron (ZOFRAN) 4 MG tablet Take 1 tablet (4 mg total) by mouth every 6 (six) hours as needed for nausea or vomiting. Patient not taking: Reported on 01/05/2016 08/16/15   Dione Booze, MD    oxyCODONE-acetaminophen (PERCOCET/ROXICET) 5-325 MG tablet Take 2 tablets by mouth every 4 (four) hours as needed for severe pain. 01/05/16   Nelva Nay, MD  predniSONE (DELTASONE) 20 MG tablet Take 2 tablets (40 mg total) by mouth daily. Patient not taking: Reported on 01/05/2016 08/16/15   Lorre Nick, MD   BP 128/83 mmHg  Pulse 88  Temp(Src) 97.5 F (36.4 C) (Oral)  Resp 15  Ht 6' (1.829 m)  Wt 175 lb (79.379 kg)  BMI 23.73 kg/m2  SpO2 99% Physical Exam  Constitutional: He is oriented to person, place, and time. He appears well-developed and well-nourished. No distress.  HENT:  Head: Normocephalic and atraumatic.  Mouth/Throat:    Eyes: Pupils are equal, round, and reactive to light.  Neck: Normal range of motion.  Cardiovascular: Normal rate and intact distal pulses.   Pulmonary/Chest: No respiratory distress.  Abdominal: Normal appearance. He exhibits no distension.  Musculoskeletal: Normal range of motion.  Neurological: He is alert and oriented to person, place, and time. No cranial nerve deficit.  Skin: Skin is warm and dry. No rash noted.  Psychiatric: He has a normal mood and affect. His behavior is normal.  Nursing note and vitals reviewed.   ED Course  Procedures (including critical care time) Labs Review Labs Reviewed  CBC WITH DIFFERENTIAL/PLATELET - Abnormal; Notable for the following:    WBC  11.3 (*)    Neutro Abs 8.5 (*)    All other components within normal limits  BASIC METABOLIC PANEL - Abnormal; Notable for the following:    Glucose, Bld 113 (*)    All other components within normal limits  URINE RAPID DRUG SCREEN, HOSP PERFORMED - Abnormal; Notable for the following:    Opiates POSITIVE (*)    Benzodiazepines POSITIVE (*)    Tetrahydrocannabinol POSITIVE (*)    All other components within normal limits  I-STAT CG4 LACTIC ACID, ED  I-STAT CG4 LACTIC ACID, ED        MDM   Final diagnoses:  Noncompliance with medications  Tooth  abscess        Nelva Nay, MD 01/05/16 1454

## 2016-09-23 ENCOUNTER — Ambulatory Visit: Payer: Self-pay

## 2016-09-23 ENCOUNTER — Ambulatory Visit (INDEPENDENT_AMBULATORY_CARE_PROVIDER_SITE_OTHER): Payer: Self-pay

## 2016-09-23 ENCOUNTER — Ambulatory Visit (INDEPENDENT_AMBULATORY_CARE_PROVIDER_SITE_OTHER): Payer: Self-pay | Admitting: Urgent Care

## 2016-09-23 ENCOUNTER — Telehealth: Payer: Self-pay

## 2016-09-23 VITALS — BP 117/70 | HR 122 | Temp 98.0°F | Resp 20 | Ht 72.0 in | Wt 177.0 lb

## 2016-09-23 DIAGNOSIS — Z8659 Personal history of other mental and behavioral disorders: Secondary | ICD-10-CM

## 2016-09-23 DIAGNOSIS — Z8709 Personal history of other diseases of the respiratory system: Secondary | ICD-10-CM

## 2016-09-23 DIAGNOSIS — R0789 Other chest pain: Secondary | ICD-10-CM

## 2016-09-23 DIAGNOSIS — R0602 Shortness of breath: Secondary | ICD-10-CM

## 2016-09-23 DIAGNOSIS — R062 Wheezing: Secondary | ICD-10-CM

## 2016-09-23 DIAGNOSIS — J029 Acute pharyngitis, unspecified: Secondary | ICD-10-CM

## 2016-09-23 DIAGNOSIS — R000 Tachycardia, unspecified: Secondary | ICD-10-CM

## 2016-09-23 DIAGNOSIS — R9431 Abnormal electrocardiogram [ECG] [EKG]: Secondary | ICD-10-CM

## 2016-09-23 LAB — POCT CBC
GRANULOCYTE PERCENT: 39 % (ref 37–80)
HCT, POC: 46.3 % (ref 43.5–53.7)
Hemoglobin: 16.3 g/dL (ref 14.1–18.1)
Lymph, poc: 3.6 — AB (ref 0.6–3.4)
MCH, POC: 31.6 pg — AB (ref 27–31.2)
MCHC: 35.2 g/dL (ref 31.8–35.4)
MCV: 89.6 fL (ref 80–97)
MID (cbc): 0.8 (ref 0–0.9)
MPV: 8.5 fL (ref 0–99.8)
PLATELET COUNT, POC: 294 10*3/uL (ref 142–424)
POC Granulocyte: 2.8 (ref 2–6.9)
POC LYMPH %: 49.5 % (ref 10–50)
POC MID %: 11.5 %M (ref 0–12)
RBC: 5.16 M/uL (ref 4.69–6.13)
RDW, POC: 12.2 %
WBC: 7.2 10*3/uL (ref 4.6–10.2)

## 2016-09-23 LAB — POCT RAPID STREP A (OFFICE): RAPID STREP A SCREEN: NEGATIVE

## 2016-09-23 MED ORDER — ALBUTEROL SULFATE (2.5 MG/3ML) 0.083% IN NEBU: 2.5000 mg | INHALATION_SOLUTION | Freq: Once | RESPIRATORY_TRACT | Status: DC

## 2016-09-23 MED ORDER — METOPROLOL SUCCINATE ER 25 MG PO TB24: 25.0000 mg | ORAL_TABLET | Freq: Every day | ORAL | 5 refills | Status: DC

## 2016-09-23 MED ORDER — ALPRAZOLAM 0.25 MG PO TABS
0.5000 mg | ORAL_TABLET | Freq: Once | ORAL | Status: AC
  Administered 2016-09-23: 0.5 mg via ORAL

## 2016-09-23 MED ORDER — LEVALBUTEROL HCL 1.25 MG/0.5ML IN NEBU: 1.2500 mg | INHALATION_SOLUTION | Freq: Once | RESPIRATORY_TRACT | Status: DC

## 2016-09-23 NOTE — Telephone Encounter (Signed)
[facility] He was advised he may use the ER if immediately concerned about chest pain or sob.

## 2016-09-23 NOTE — Telephone Encounter (Signed)
Patient called back to explain he doesn't understand why he wasn't given an rx. For his asthma medicine when he is out and that's what he came in for. He is confused on why he was given "heart medicine" and [facility]. I explained the summary of the visit and discharge instructions but [facility], he is upset to be without asthma meds in case of an asthma attack.   [phone] (H)

## 2016-09-23 NOTE — Progress Notes (Signed)
MRN: [id number] DOB: 10/02/1971  Subjective:   Wayne HarriesJohn Hawkins is a 45 y.o. male presenting for chief complaint of Shortness of Breath (States nebulizer is not working. x1074months. PT ENCOURAGED TO CONTINUE FILLING OUT PAPERWORK WHILE WAITING FOR PROVIDER. )  Reports longstanding history of asthma 2009 diagnosed by Dr. Merla Richesoolittle per patient. Has been using albuterol inhaler and nebulizer machine at home. Using albuterol inhaler (~10x/week) and nebulizer daily (BID). Is currently out of his albuterol inhaler.  Feels shob, wheezing despite using these treatments. Also has sharp left sided chest pain lasting ~5-7 minutes. Chest pain has been occurring twice a day for the past several months. Shob can occur without chest pain and can present with blurred vision, sweating. Chest pain does not radiate. Last treatment was this morning. Denies fever, n/v, abdominal pain, weight loss, rashes, diaphoresis, neck or jaw pain. Denies smoking cigarettes. Denies ever smoking cigarettes. Denies alcohol use. Admits history of anxiety and depression, not currently taking any medications. Has a history of marijuana dependence and abuse, not currently using this or other illicit drugs. Denies history of narcotic abuse, dependence.  Wayne Hawkins has a current medication list which includes the following prescription(s): albuterol, diphenhydramine, and levetiracetam. Also has No Known Allergies.  Wayne RuizJohn  has a past medical history of Asthma. Also  has no past surgical history on file.  Objective:   Vitals: BP 112/80 (BP Location: Left Arm, Patient Position: Sitting, Cuff Size: Normal)   Pulse (!) 137   Temp 98 F (36.7 C) (Oral)   Resp 17   Ht 6' (1.829 m)   Wt 177 lb (80.3 kg)   SpO2 95%   BMI 24.01 kg/m   Physical Exam  Constitutional: He is oriented to person, place, and time. He appears well-developed and well-nourished.  Eyes: No scleral icterus.  Cardiovascular: Normal rate, regular rhythm and intact distal  pulses.  Exam reveals no gallop and no friction rub.   No murmur heard. Pulmonary/Chest: No respiratory distress. He has wheezes (throughout). He has no rales.  Neurological: He is alert and oriented to person, place, and time.  Skin: Skin is warm and dry.   ECG interpretation - Short PR interval, sinus tachycardia.  Dg Chest 1 View  Result Date: 09/23/2016 CLINICAL DATA:  Shortness of breath, wheezing. EXAM: CHEST 1 VIEW COMPARISON:  PA view 09/23/2016 FINDINGS: Lungs are clear. Heart is normal size. No bony abnormality. No effusions. IMPRESSION: Negative. Electronically Signed   By: Charlett NoseKevin  Dover M.D.   On: 09/23/2016 15:51   Dg Chest 1 View  Result Date: 09/23/2016 CLINICAL DATA:  Shortness of breath, wheezing EXAM: CHEST 1 VIEW COMPARISON:  01/09/2010 FINDINGS: Heart and mediastinal contours are within normal limits. No focal opacities or effusions. No acute bony abnormality. IMPRESSION: No active disease. Electronically Signed   By: Charlett NoseKevin  Dover M.D.   On: 09/23/2016 15:31   Results for orders placed or performed in visit on 09/23/16 (from the past 24 hour(s))  POCT CBC     Status: Abnormal   Collection Time: 09/23/16  2:41 PM  Result Value Ref Range   WBC 7.2 4.6 - 10.2 K/uL   Lymph, poc 3.6 (A) 0.6 - 3.4   POC LYMPH PERCENT 49.5 10 - 50 %L   MID (cbc) 0.8 0 - 0.9   POC MID % 11.5 0 - 12 %M   POC Granulocyte 2.8 2 - 6.9   Granulocyte percent 39.0 37 - 80 %G   RBC 5.16 4.69 - 6.13 M/uL  Hemoglobin 16.3 14.1 - 18.1 g/dL   HCT, POC 19.146.3 47.843.5 - 53.7 %   MCV 89.6 80 - 97 fL   MCH, POC 31.6 (A) 27 - 31.2 pg   MCHC 35.2 31.8 - 35.4 g/dL   RDW, POC 29.512.2 %   Platelet Count, POC 294 142 - 424 K/uL   MPV 8.5 0 - 99.8 fL  POCT rapid strep A     Status: None   Collection Time: 09/23/16  3:30 PM  Result Value Ref Range   Rapid Strep A Screen Negative Negative   Assessment and Plan :   This case was precepted with Dr. Neva SeatGreene.   1. Atypical chest pain 2. Shortness of breath 3.  Wheezing 4. History of asthma 5. History of anxiety 6. History of depression 7. Sore throat 8. Tachycardia 9. Shortened PR interval - Unclear etiology, referral to cardiology pending. Patient is to start metoprolol in the mean time to help with both tachycardia and anxiety. Will hold off on anxiety medications until cleared by cardiology. But I suspect that this is a component of his shortness of breath given that his nebulizer treatments and albuterol are not helping the patient. For now, he will hold off on albuterol use, cut back on caffeine and hydrate much better. I did review symptoms of ACS, call 911 if this develops. Patient verbalized understanding.   Wallis BambergMario Martrell Eguia, PA-C [facility] [phone] 09/23/2016 2:16 PM

## 2016-09-23 NOTE — Telephone Encounter (Signed)
Reviewed our treatment plan with the patient. I do not suspect his problem is asthma. I discussed this with him at length and the fact that even though we did not use albuterol while he had an episode of chest pain, shob, blurred vision in clinic, he still got better with controlling his breathing, hydrating, Xanax. His labs and x-ray were reassuring up to now. The priority should be to evaluate his chest pain, shortened PR interval with cardiology. If he is cleared we need to consider anti-anxiety medications and a referral to pulmonology for official testing. Patient was in agreement and for now will start Metoprolol. He is to call 911 or report to the ED if his chest pain returns and has ACS characteristics, he verbalized understanding.

## 2016-09-23 NOTE — Telephone Encounter (Signed)
Pt has called back stating that he has read the instructions on his meds and that it could cause shortness of breath and he is very upset since he has asthma issues already why this medication would be given to him  Best number [phone]

## 2016-09-23 NOTE — Patient Instructions (Addendum)
Please hydrate much better including at least 2 liters of water daily. Cut back on your caffeine drinks. Please hold off on your albuterol medications as well. Lastly, let me know if you are not hearing from us or a cardiology office within 1 week.    Nonspecific Chest Pain  Chest pain can be caused by many different conditions. There is always a chance that your pain could be related to something serious, such as a heart attack or a blood clot in your lungs. Chest pain can also be caused by conditions that are not life-threatening. If you have chest pain, it is very important to follow up with your health care provider. CAUSES  Chest pain can be caused by:  Heartburn.  Pneumonia or bronchitis.  Anxiety or stress.  Inflammation around your heart (pericarditis) or lung (pleuritis or pleurisy).  A blood clot in your lung.  A collapsed lung (pneumothorax). It can develop suddenly on its own (spontaneous pneumothorax) or from trauma to the chest.  Shingles infection (varicella-zoster virus).  Heart attack.  Damage to the bones, muscles, and cartilage that make up your chest wall. This can include:  Bruised bones due to injury.  Strained muscles or cartilage due to frequent or repeated coughing or overwork.  Fracture to one or more ribs.  Sore cartilage due to inflammation (costochondritis). RISK FACTORS  Risk factors for chest pain may include:  Activities that increase your risk for trauma or injury to your chest.  Respiratory infections or conditions that cause frequent coughing.  Medical conditions or overeating that can cause heartburn.  Heart disease or family history of heart disease.  Conditions or health behaviors that increase your risk of developing a blood clot.  Having had chicken pox (varicella zoster). SIGNS AND SYMPTOMS Chest pain can feel like:  Burning or tingling on the surface of your chest or deep in your chest.  Crushing, pressure, aching, or  squeezing pain.  Dull or sharp pain that is worse when you move, cough, or take a deep breath.  Pain that is also felt in your back, neck, shoulder, or arm, or pain that spreads to any of these areas. Your chest pain may come and go, or it may stay constant. DIAGNOSIS Lab tests or other studies may be needed to find the cause of your pain. Your health care provider may have you take a test called an ambulatory ECG (electrocardiogram). An ECG records your heartbeat patterns at the time the test is performed. You may also have other tests, such as:  Transthoracic echocardiogram (TTE). During echocardiography, sound waves are used to create a picture of all of the heart structures and to look at how blood flows through your heart.  Transesophageal echocardiogram (TEE).This is a more advanced imaging test that obtains images from inside your body. It allows your health care provider to see your heart in finer detail.  Cardiac monitoring. This allows your health care provider to monitor your heart rate and rhythm in real time.  Holter monitor. This is a portable device that records your heartbeat and can help to diagnose abnormal heartbeats. It allows your health care provider to track your heart activity for several days, if needed.  Stress tests. These can be done through exercise or by taking medicine that makes your heart beat more quickly.  Blood tests.  Imaging tests. TREATMENT  Your treatment depends on what is causing your chest pain. Treatment may include:  Medicines. These may include:  Acid blockers for heartburn.  Anti-inflammatory medicine.  Pain medicine for inflammatory conditions.  Antibiotic medicine, if an infection is present.  Medicines to dissolve blood clots.  Medicines to treat coronary artery disease.  Supportive care for conditions that do not require medicines. This may include:  Resting.  Applying heat or cold packs to injured areas.  Limiting  activities until pain decreases. HOME CARE INSTRUCTIONS  If you were prescribed an antibiotic medicine, finish it all even if you start to feel better.  Avoid any activities that bring on chest pain.  Do not use any tobacco products, including cigarettes, chewing tobacco, or electronic cigarettes. If you need help quitting, ask your health care provider.  Do not drink alcohol.  Take medicines only as directed by your health care provider.  Keep all follow-up visits as directed by your health care provider. This is important. This includes any further testing if your chest pain does not go away.  If heartburn is the cause for your chest pain, you may be told to keep your head raised (elevated) while sleeping. This reduces the chance that acid will go from your stomach into your esophagus.  Make lifestyle changes as directed by your health care provider. These may include:  Getting regular exercise. Ask your health care provider to suggest some activities that are safe for you.  Eating a heart-healthy diet. A registered dietitian can help you to learn healthy eating options.  Maintaining a healthy weight.  Managing diabetes, if necessary.  Reducing stress. SEEK MEDICAL CARE IF:  Your chest pain does not go away after treatment.  You have a rash with blisters on your chest.  You have a fever. SEEK IMMEDIATE MEDICAL CARE IF:   Your chest pain is worse.  You have an increasing cough, or you cough up blood.  You have severe abdominal pain.  You have severe weakness.  You faint.  You have chills.  You have sudden, unexplained chest discomfort.  You have sudden, unexplained discomfort in your arms, back, neck, or jaw.  You have shortness of breath at any time.  You suddenly start to sweat, or your skin gets clammy.  You feel nauseous or you vomit.  You suddenly feel light-headed or dizzy.  Your heart begins to beat quickly, or it feels like it is skipping  beats. These symptoms may represent a serious problem that is an emergency. Do not wait to see if the symptoms will go away. Get medical help right away. Call your local emergency services (911 in the [place] Do not drive yourself to the hospital.   This information is not intended to replace advice given to you by your health care provider. Make sure you discuss any questions you have with your health care provider.   Document Released: 08/19/2005 Document Revised: 11/30/2014 Document Reviewed: 06/15/2014 Elsevier Interactive Patient Education 2016 [employer]     IF you received an x-ray today, you will receive an invoice from [facility]. Please contact [facility] at [phone] with questions or concerns regarding your invoice.   IF you received labwork today, you will receive an invoice from [facility]. Please contact [facility] at [phone] with questions or concerns regarding your invoice.   Our billing staff will not be able to assist you with questions regarding bills from these companies.  You will be contacted with the lab results as soon as they are available. The fastest way to get your results is to activate your My Chart account. Instructions are located on the  last page of this paperwork. If you have not heard from [place] regarding the results in 2 weeks, please contact this office.

## 2016-09-24 LAB — COMPREHENSIVE METABOLIC PANEL
ALT: 17 U/L (ref 9–46)
AST: 14 U/L (ref 10–40)
Albumin: 4.1 g/dL (ref 3.6–5.1)
Alkaline Phosphatase: 44 U/L (ref 40–115)
BUN: 15 mg/dL (ref 7–25)
CHLORIDE: 103 mmol/L (ref 98–110)
CO2: 21 mmol/L (ref 20–31)
CREATININE: 0.99 mg/dL (ref 0.60–1.35)
Calcium: 9.9 mg/dL (ref 8.6–10.3)
Glucose, Bld: 97 mg/dL (ref 65–99)
Potassium: 5 mmol/L (ref 3.5–5.3)
SODIUM: 138 mmol/L (ref 135–146)
Total Bilirubin: 0.8 mg/dL (ref 0.2–1.2)
Total Protein: 6.8 g/dL (ref 6.1–8.1)

## 2016-09-24 LAB — THYROID PANEL WITH TSH
Free Thyroxine Index: 7.6 — ABNORMAL HIGH (ref 1.4–3.8)
T3 Uptake: 40 % — ABNORMAL HIGH (ref 22–35)
T4, Total: 19 ug/dL — ABNORMAL HIGH (ref 4.5–12.0)
TSH: 0.01 mIU/L — ABNORMAL LOW (ref 0.40–4.50)

## 2016-11-19 ENCOUNTER — Telehealth: Payer: Self-pay

## 2016-11-19 NOTE — Telephone Encounter (Signed)
I tried contacting patient many times to discuss his diagnosis. When I could not get in touch with him, I sent a letter out. He needs follow up for his thyroid. I strongly believe that this is the source of his symptoms. Please review the lab notes that I wrote.  "I have tried contact patient 3 times. He does not have voicemail set up. Please let him know that he has an overactive thyroid. I believe this is by and large the cause of all his symptoms. I need to refer him to an endocrinologist. Please try to contact patient again and if we are unable to reach him, I will have to send a letter out."

## 2016-11-19 NOTE — Telephone Encounter (Signed)
Pt called again tonight requesting to speak with a manager or "someone in change of the things that go on around there". As all managers had left for the day, he then asked to speak with [facility]. I placed him on hold and Urban GibsonMani was able to speak with him.

## 2016-11-19 NOTE — Telephone Encounter (Signed)
Pt called again immediately yelling to the point that it was incomprehensive. Wayne Hawkins was sitting next to me, heard the shouting, and asked to speak with him. She asked him multiple times to speak more quietly so that she could understand him. He continued to yell at which point she stated that he can call back during regular business hours to make a complaint if he would like to. As he was still shouting, we had to repeat that he can call back and hung up the phone.   *routing to Dr Katrinka BlazingSmith as its a continuation of today's previous msg routed by [facility] but could not be attached to the conversation

## 2016-11-19 NOTE — Telephone Encounter (Signed)
Pt called very angry and yelling on phone that he paid his $300 + for OV and he didn't even get his Rxs for his albuterol medications and got "some blood pressure medication instead". I tried explaining [name]'s notes from OV, that he was to see cardiologist to have the heart issue checked and the metoprolol should help with the tachycardia and anxiety until then. Pt was very difficult to understand because he was so upset and yelling, and I believe even may have mentioned contacting a Clinical research associatelawyer. I didn't realize at the time how much time had gone by since this occurred and that Urban GibsonMani had already had a conversation with him after he left that day. Pt put another gentleman on the phone who wanted to know why pt wasn't offered anything else for his asthma if [facility] didn't want pt using the albuterol. He stated that there are other people who may have a heart issue and asthma too, and they are upset that nothing else was offered to treat pt's asthma. After they hung up, I checked on cardiologist referral and it looks like pt refused the appt. [name], please advise.

## 2016-11-19 NOTE — Telephone Encounter (Signed)
I received a phone call from this patient. He was very angry, was yelling and verbally abusive threatening legal action. His grief was that he was not prescribed Singulair and albuterol. There is no mention of this in my note. I remember specifically discussing at length with the patient avoiding this until he was seen by cardiology for a consult. I drew labs that day which he was also reluctant to do. What I found was that his labs suggested he had hyperthyroidism. Review lab notes for more details. Unfortunately, I could not speak with patient whilst he was threatening and being verbally abusive. I asked him multiple times to calm down and have an amicable discussion with me about his management. I did my best to reassure him that we tried to be thorough with him and even discussed his case at length with Dr. Neva SeatGreene. When I realized a respectable conversation was not going to happen, I recommended patient call our practice administrator to discuss his grievances. I subsequently ended the phone call. I will forward this to our medical director for her review.

## 2016-11-21 ENCOUNTER — Telehealth: Payer: Self-pay

## 2016-11-21 NOTE — Telephone Encounter (Addendum)
Pt and his emergency contact Mr. Kyung RuddKennedy came into the office to get copies of medical records that he requested two days ago that he stated has not had a call back about   Best number [phone] Mr. Kyung RuddKennedy   Pt called back stating that he would like to be called before we make copies of his records because it is a law that he has the right to be beside the person making copies   Best number for patient [phone]

## 2018-01-30 ENCOUNTER — Emergency Department (HOSPITAL_COMMUNITY)
Admission: EM | Admit: 2018-01-30 | Discharge: 2018-01-30 | Disposition: A | Discharge status: Home / Self Care | Payer: Self-pay | Attending: Emergency Medicine | Admitting: Emergency Medicine

## 2018-01-30 ENCOUNTER — Other Ambulatory Visit: Payer: Self-pay

## 2018-01-30 ENCOUNTER — Encounter (HOSPITAL_COMMUNITY): Payer: Self-pay

## 2018-01-30 DIAGNOSIS — R110 Nausea: Secondary | ICD-10-CM | POA: Insufficient documentation

## 2018-01-30 DIAGNOSIS — Z79899 Other long term (current) drug therapy: Secondary | ICD-10-CM | POA: Insufficient documentation

## 2018-01-30 DIAGNOSIS — J45909 Unspecified asthma, uncomplicated: Secondary | ICD-10-CM | POA: Insufficient documentation

## 2018-01-30 DIAGNOSIS — R569 Unspecified convulsions: Secondary | ICD-10-CM | POA: Insufficient documentation

## 2018-01-30 DIAGNOSIS — E860 Dehydration: Secondary | ICD-10-CM | POA: Insufficient documentation

## 2018-01-30 HISTORY — DX: Unspecified convulsions: R56.9

## 2018-01-30 LAB — CBC
HCT: 45.6 % (ref 39.0–52.0)
Hemoglobin: 15.7 g/dL (ref 13.0–17.0)
MCH: 32.4 pg (ref 26.0–34.0)
MCHC: 34.4 g/dL (ref 30.0–36.0)
MCV: 94 fL (ref 78.0–100.0)
PLATELETS: 320 10*3/uL (ref 150–400)
RBC: 4.85 MIL/uL (ref 4.22–5.81)
RDW: 12.8 % (ref 11.5–15.5)
WBC: 9.2 10*3/uL (ref 4.0–10.5)

## 2018-01-30 LAB — COMPREHENSIVE METABOLIC PANEL
ALBUMIN: 4.4 g/dL (ref 3.5–5.0)
ALT: 14 U/L — ABNORMAL LOW (ref 17–63)
ANION GAP: 12 (ref 5–15)
AST: 20 U/L (ref 15–41)
Alkaline Phosphatase: 52 U/L (ref 38–126)
BUN: 8 mg/dL (ref 6–20)
CHLORIDE: 109 mmol/L (ref 101–111)
CO2: 20 mmol/L — AB (ref 22–32)
Calcium: 9.4 mg/dL (ref 8.9–10.3)
Creatinine, Ser: 1.07 mg/dL (ref 0.61–1.24)
GFR calc non Af Amer: >60 mL/min (ref >60)
GLUCOSE: 143 mg/dL — AB (ref 65–99)
POTASSIUM: 3.8 mmol/L (ref 3.5–5.1)
SODIUM: 141 mmol/L (ref 135–145)
Total Bilirubin: 1.1 mg/dL (ref 0.3–1.2)
Total Protein: 7.6 g/dL (ref 6.5–8.1)

## 2018-01-30 LAB — URINALYSIS, ROUTINE W REFLEX MICROSCOPIC
BILIRUBIN URINE: NEGATIVE
Glucose, UA: NEGATIVE mg/dL
KETONES UR: 5 mg/dL — AB
Leukocytes, UA: NEGATIVE
NITRITE: NEGATIVE
PH: 5 (ref 5.0–8.0)
PROTEIN: 30 mg/dL — AB
SQUAMOUS EPITHELIAL / LPF: NONE SEEN
Specific Gravity, Urine: 1.019 (ref 1.005–1.030)

## 2018-01-30 LAB — LIPASE, BLOOD: Lipase: 23 U/L (ref 11–51)

## 2018-01-30 LAB — CBG MONITORING, ED: GLUCOSE-CAPILLARY: 91 mg/dL (ref 65–99)

## 2018-01-30 MED ORDER — ONDANSETRON 4 MG PO TBDP: 4.0000 mg | ORAL_TABLET | Freq: Three times a day (TID) | ORAL | 0 refills | Status: DC | PRN

## 2018-01-30 MED ORDER — LORAZEPAM 2 MG/ML IJ SOLN
1.0000 mg | Freq: Once | INTRAMUSCULAR | Status: AC
  Administered 2018-01-30: 1 mg via INTRAVENOUS
  Filled 2018-01-30: qty 1

## 2018-01-30 MED ORDER — ONDANSETRON HCL 4 MG/2ML IJ SOLN
4.0000 mg | Freq: Once | INTRAMUSCULAR | Status: AC
  Administered 2018-01-30: 4 mg via INTRAVENOUS
  Filled 2018-01-30: qty 2

## 2018-01-30 MED ORDER — SODIUM CHLORIDE 0.9 % IV BOLUS (SEPSIS)
1000.0000 mL | Freq: Once | INTRAVENOUS | Status: AC
  Administered 2018-01-30: 1000 mL via INTRAVENOUS

## 2018-01-30 MED ORDER — SODIUM CHLORIDE 0.9 % IV SOLN
INTRAVENOUS | Status: DC
  Administered 2018-01-30: 07:00:00 via INTRAVENOUS

## 2018-01-30 MED ORDER — LEVETIRACETAM IN NACL 1000 MG/100ML IV SOLN
1000.0000 mg | Freq: Once | INTRAVENOUS | Status: AC
  Administered 2018-01-30: 1000 mg via INTRAVENOUS
  Filled 2018-01-30: qty 100

## 2018-01-30 MED ORDER — HYDROCODONE-ACETAMINOPHEN 5-325 MG PO TABS: 1.0000 | ORAL_TABLET | ORAL | 0 refills | Status: DC | PRN

## 2018-01-30 NOTE — ED Triage Notes (Addendum)
Per report:  Pt was lying in bed and his friend heard loud banging against pt's walls and friend witnessed "5-338min grand mal seizure"  EMS reports pt was post-ictal en route.  On arrival pt is AxOx4.  Pt reported "cramping across stomach" w/sharp pain to Lt flank, dark urine, w/odor, and pain w/urination x a week.  EMS ekg:  Sinus tach

## 2018-01-30 NOTE — ED Provider Notes (Signed)
[facility]-EMERGENCY DEPT Provider Note   CSN: [id number] Arrival date & time: 01/30/18  3235     History   Chief Complaint Chief Complaint  Patient presents with  . Seizures    HPI Wayne Hawkins is a 47 y.o. male.  Pt presents to the ED today with a seizure.  Pt's roommate heard loud banging against the wall and witnessed this patient having a 5-8 min grand mal seizure.  He has had seizures in the past and is supposed to be on Keppra, but does not take it due to the fact that he only has seizures about once a year.  The pt said he's been feeling nauseous for the past few days and his stomach has not been feeling well.  The pt said his urine has had a strong odor.  He has low back pain and thigh pain after his seizure.  Pt's roommate heard loud banging against the wall and witnessed this patient having a 5-8 min grand mal seizure.      Past Medical History:  Diagnosis Date  . Asthma   . Seizures [facility])     Patient Active Problem List   Diagnosis Date Noted  . Marijuana dependence (HCC) 07/29/2014  . Severe pain of left shoulder 07/29/2014  . Bilateral groin pain 07/29/2014  . Anxiety state, unspecified 07/29/2014  . Observed seizure-like activity (HCC) 07/28/2014    No past surgical history on file.     Home Medications    Prior to Admission medications   Medication Sig Start Date End Date Taking? Authorizing Provider  ALBUTEROL IN Inhale 1 Act into the lungs daily as needed.   Yes [provider]  diphenhydrAMINE (BENADRYL) 25 MG tablet Take 25 mg by mouth every 6 (six) hours as needed for allergies.   Yes [provider]  levETIRAcetam (KEPPRA) 500 MG tablet Take 1 tablet (500 mg total) by mouth 2 (two) times daily. Patient not taking: Reported on 01/30/2018 01/05/16   Nelva Nay, MD  metoprolol succinate (TOPROL-XL) 25 MG 24 hr tablet Take 1 tablet (25 mg total) by mouth daily. Patient not taking: Reported on 01/30/2018  09/23/16   Wallis Bamberg, PA-C    Family History No family history on file.  Social History Social History   Tobacco Use  . Smoking status: Never Smoker  Substance Use Topics  . Alcohol use: No  . Drug use: Not on file     Allergies   Patient has no known allergies.   Review of Systems Review of Systems  Gastrointestinal: Positive for abdominal pain and nausea.  Musculoskeletal: Positive for back pain.       Bilateral thigh pain  All other systems reviewed and are negative.    Physical Exam Updated Vital Signs BP (!) 133/93   Pulse 90   Temp 97.8 F (36.6 C) (Oral)   Resp 16   Ht 6' (1.829 m)   Wt 80.7 kg (178 lb)   SpO2 98%   BMI 24.14 kg/m   Physical Exam  Constitutional: He is oriented to person, place, and time. He appears well-developed and well-nourished.  HENT:  Head: Normocephalic and atraumatic.  Right Ear: External ear normal.  Left Ear: External ear normal.  Nose: Nose normal.  Mouth/Throat: Oropharynx is clear and moist.  Eyes: Conjunctivae and EOM are normal. Pupils are equal, round, and reactive to light.  Neck: Normal range of motion. Neck supple.  Cardiovascular: Normal rate, regular rhythm, normal heart sounds and intact  distal pulses.  Pulmonary/Chest: Effort normal and breath sounds normal.  Abdominal: Soft. Bowel sounds are normal.  Musculoskeletal: Normal range of motion.  Neurological: He is alert and oriented to person, place, and time.  Skin: Skin is warm. Capillary refill takes less than 2 seconds.  Psychiatric: He has a normal mood and affect. His behavior is normal. Judgment and thought content normal.  Nursing note and vitals reviewed.    ED Treatments / Results  Labs (all labs ordered are listed, but only abnormal results are displayed) Labs Reviewed  URINALYSIS, ROUTINE W REFLEX MICROSCOPIC - Abnormal; Notable for the following components:      Result Value   Hgb urine dipstick SMALL (*)    Ketones, ur 5 (*)     Protein, ur 30 (*)    Bacteria, UA RARE (*)    All other components within normal limits  COMPREHENSIVE METABOLIC PANEL - Abnormal; Notable for the following components:   CO2 20 (*)    Glucose, Bld 143 (*)    ALT 14 (*)    All other components within normal limits  CBC  LIPASE, BLOOD  CBG MONITORING, ED    EKG  EKG Interpretation None       Radiology No results found.  Procedures Procedures (including critical care time)  Medications Ordered in ED Medications  sodium chloride 0.9 % bolus 1,000 mL (0 mLs Intravenous Stopped 01/30/18 0725)    And  0.9 %  sodium chloride infusion ( Intravenous New Bag/Given 01/30/18 0726)  ondansetron (ZOFRAN) injection 4 mg (4 mg Intravenous Given 01/30/18 0725)  LORazepam (ATIVAN) injection 1 mg (1 mg Intravenous Given 01/30/18 0725)  levETIRAcetam (KEPPRA) IVPB 1000 mg/100 mL premix (0 mg Intravenous Stopped 01/30/18 0807)     Initial Impression / Assessment and Plan / ED Course  I have reviewed the triage vital signs and the nursing notes.  Pertinent labs & imaging results that were available during my care of the patient were reviewed by me and considered in my medical decision making (see chart for details).    Pt is feeling much better.  He was given a loading dose of keppra (1000 mg) here, but didn't want a rx.  He said it makes him feel too weird.  He is tolerating po fluids.  He knows to return if worse.    Final Clinical Impressions(s) / ED Diagnoses   Final diagnoses:  Seizure [facility](HCC)  Dehydration  Nausea    ED Discharge Orders    None       Jacalyn LefevreHaviland, Chaden Doom, MD 01/30/18 1104

## 2018-01-30 NOTE — ED Notes (Signed)
Bed: [id number] Expected date:  Expected time:  Means of arrival:  Comments: 47 yo M/ Seizure

## 2018-01-30 NOTE — ED Notes (Signed)
Pt c/o 10/10 low back L>R pain intermittently.  States he feels like his brain is foggy.  States for the last 3 days he's been unable to eat d/t nausea and "my stomach has been spinning"

## 2018-01-30 NOTE — ED Notes (Signed)
Pt has urinal for urine sample

## 2019-01-21 ENCOUNTER — Encounter (HOSPITAL_COMMUNITY): Payer: Self-pay | Admitting: *Deleted

## 2019-01-21 ENCOUNTER — Other Ambulatory Visit: Payer: Self-pay

## 2019-01-21 ENCOUNTER — Inpatient Hospital Stay (HOSPITAL_COMMUNITY)
Admission: EM | Admit: 2019-01-21 | Discharge: 2019-01-24 | DRG: 193 | Disposition: A | Discharge status: Home / Self Care | Payer: Self-pay | Attending: Internal Medicine | Admitting: Internal Medicine

## 2019-01-21 ENCOUNTER — Emergency Department (HOSPITAL_COMMUNITY): Payer: Self-pay

## 2019-01-21 DIAGNOSIS — J441 Chronic obstructive pulmonary disease with (acute) exacerbation: Secondary | ICD-10-CM | POA: Diagnosis present

## 2019-01-21 DIAGNOSIS — F410 Panic disorder [episodic paroxysmal anxiety] without agoraphobia: Secondary | ICD-10-CM | POA: Diagnosis present

## 2019-01-21 DIAGNOSIS — Z8249 Family history of ischemic heart disease and other diseases of the circulatory system: Secondary | ICD-10-CM

## 2019-01-21 DIAGNOSIS — J9601 Acute respiratory failure with hypoxia: Secondary | ICD-10-CM | POA: Diagnosis present

## 2019-01-21 DIAGNOSIS — J101 Influenza due to other identified influenza virus with other respiratory manifestations: Principal | ICD-10-CM | POA: Diagnosis present

## 2019-01-21 DIAGNOSIS — J209 Acute bronchitis, unspecified: Secondary | ICD-10-CM | POA: Diagnosis present

## 2019-01-21 DIAGNOSIS — J440 Chronic obstructive pulmonary disease with acute lower respiratory infection: Secondary | ICD-10-CM | POA: Diagnosis present

## 2019-01-21 DIAGNOSIS — Z87891 Personal history of nicotine dependence: Secondary | ICD-10-CM

## 2019-01-21 DIAGNOSIS — R112 Nausea with vomiting, unspecified: Secondary | ICD-10-CM | POA: Diagnosis present

## 2019-01-21 LAB — RAPID URINE DRUG SCREEN, HOSP PERFORMED
Amphetamines: NOT DETECTED
BARBITURATES: NOT DETECTED
Benzodiazepines: POSITIVE — AB
COCAINE: NOT DETECTED
Opiates: NOT DETECTED
Tetrahydrocannabinol: POSITIVE — AB

## 2019-01-21 LAB — CBC WITH DIFFERENTIAL/PLATELET
ABS IMMATURE GRANULOCYTES: 0.02 10*3/uL (ref 0.00–0.07)
Basophils Absolute: 0 10*3/uL (ref 0.0–0.1)
Basophils Relative: 0 %
EOS ABS: 0 10*3/uL (ref 0.0–0.5)
Eosinophils Relative: 0 %
HCT: 44.3 % (ref 39.0–52.0)
Hemoglobin: 14.8 g/dL (ref 13.0–17.0)
IMMATURE GRANULOCYTES: 0 %
Lymphocytes Relative: 14 %
Lymphs Abs: 1.2 10*3/uL (ref 0.7–4.0)
MCH: 31.6 pg (ref 26.0–34.0)
MCHC: 33.4 g/dL (ref 30.0–36.0)
MCV: 94.5 fL (ref 80.0–100.0)
Monocytes Absolute: 1.1 10*3/uL — ABNORMAL HIGH (ref 0.1–1.0)
Monocytes Relative: 13 %
NEUTROS ABS: 6 10*3/uL (ref 1.7–7.7)
NEUTROS PCT: 73 %
PLATELETS: 238 10*3/uL (ref 150–400)
RBC: 4.69 MIL/uL (ref 4.22–5.81)
RDW: 12.5 % (ref 11.5–15.5)
WBC: 8.3 10*3/uL (ref 4.0–10.5)
nRBC: 0 % (ref 0.0–0.2)

## 2019-01-21 LAB — MRSA PCR SCREENING: MRSA by PCR: NEGATIVE

## 2019-01-21 LAB — URINALYSIS, ROUTINE W REFLEX MICROSCOPIC
BILIRUBIN URINE: NEGATIVE
Glucose, UA: 50 mg/dL — AB
Hgb urine dipstick: NEGATIVE
KETONES UR: 5 mg/dL — AB
Leukocytes,Ua: NEGATIVE
Nitrite: NEGATIVE
Protein, ur: NEGATIVE mg/dL
SPECIFIC GRAVITY, URINE: 1.008 (ref 1.005–1.030)
pH: 6 (ref 5.0–8.0)

## 2019-01-21 LAB — COMPREHENSIVE METABOLIC PANEL
ALT: 13 U/L (ref 0–44)
ANION GAP: 11 (ref 5–15)
AST: 17 U/L (ref 15–41)
Albumin: 4.6 g/dL (ref 3.5–5.0)
Alkaline Phosphatase: 48 U/L (ref 38–126)
BILIRUBIN TOTAL: 0.6 mg/dL (ref 0.3–1.2)
BUN: 8 mg/dL (ref 6–20)
CHLORIDE: 106 mmol/L (ref 98–111)
CO2: 20 mmol/L — ABNORMAL LOW (ref 22–32)
Calcium: 9.1 mg/dL (ref 8.9–10.3)
Creatinine, Ser: 0.92 mg/dL (ref 0.61–1.24)
GFR calc Af Amer: >60 mL/min (ref >60)
Glucose, Bld: 173 mg/dL — ABNORMAL HIGH (ref 70–99)
POTASSIUM: 3.1 mmol/L — AB (ref 3.5–5.1)
SODIUM: 137 mmol/L (ref 135–145)
TOTAL PROTEIN: 7.7 g/dL (ref 6.5–8.1)

## 2019-01-21 LAB — INFLUENZA PANEL BY PCR (TYPE A & B)
INFLAPCR: POSITIVE — AB
Influenza B By PCR: NEGATIVE

## 2019-01-21 LAB — LACTIC ACID, PLASMA: LACTIC ACID, VENOUS: 1.9 mmol/L (ref 0.5–1.9)

## 2019-01-21 LAB — PROTIME-INR
INR: 0.9 (ref 0.8–1.2)
Prothrombin Time: 12.5 seconds (ref 11.4–15.2)

## 2019-01-21 MED ORDER — GUAIFENESIN ER 600 MG PO TB12
600.0000 mg | ORAL_TABLET | Freq: Two times a day (BID) | ORAL | Status: DC
  Administered 2019-01-21 – 2019-01-24 (×5): 600 mg via ORAL
  Filled 2019-01-21 (×6): qty 1

## 2019-01-21 MED ORDER — SODIUM CHLORIDE 0.9 % IV BOLUS
1000.0000 mL | Freq: Once | INTRAVENOUS | Status: AC
  Administered 2019-01-21: 1000 mL via INTRAVENOUS

## 2019-01-21 MED ORDER — ALBUTEROL SULFATE (2.5 MG/3ML) 0.083% IN NEBU: 5.0000 mg | INHALATION_SOLUTION | Freq: Once | RESPIRATORY_TRACT | Status: DC

## 2019-01-21 MED ORDER — LORAZEPAM 2 MG/ML IJ SOLN
1.0000 mg | INTRAMUSCULAR | Status: DC | PRN
  Administered 2019-01-22 – 2019-01-24 (×9): 1 mg via INTRAVENOUS
  Filled 2019-01-21 (×9): qty 1

## 2019-01-21 MED ORDER — SODIUM CHLORIDE 0.9% FLUSH
3.0000 mL | Freq: Two times a day (BID) | INTRAVENOUS | Status: DC
  Administered 2019-01-21 – 2019-01-23 (×5): 3 mL via INTRAVENOUS

## 2019-01-21 MED ORDER — METHYLPREDNISOLONE SODIUM SUCC 40 MG IJ SOLR
40.0000 mg | Freq: Two times a day (BID) | INTRAMUSCULAR | Status: DC
  Administered 2019-01-21 – 2019-01-22 (×2): 40 mg via INTRAVENOUS
  Filled 2019-01-21 (×2): qty 1

## 2019-01-21 MED ORDER — ACETAMINOPHEN 325 MG PO TABS: 650.0000 mg | ORAL_TABLET | Freq: Four times a day (QID) | ORAL | Status: DC | PRN

## 2019-01-21 MED ORDER — IPRATROPIUM-ALBUTEROL 0.5-2.5 (3) MG/3ML IN SOLN
3.0000 mL | Freq: Four times a day (QID) | RESPIRATORY_TRACT | Status: DC
  Administered 2019-01-21: 3 mL via RESPIRATORY_TRACT
  Filled 2019-01-21 (×2): qty 3

## 2019-01-21 MED ORDER — LORAZEPAM 2 MG/ML IJ SOLN
1.0000 mg | Freq: Once | INTRAMUSCULAR | Status: AC
  Administered 2019-01-21: 1 mg via INTRAVENOUS
  Filled 2019-01-21: qty 1

## 2019-01-21 MED ORDER — LORAZEPAM 2 MG/ML IJ SOLN
0.5000 mg | INTRAMUSCULAR | Status: DC | PRN
  Administered 2019-01-21: 0.5 mg via INTRAVENOUS
  Filled 2019-01-21: qty 1

## 2019-01-21 MED ORDER — LEVALBUTEROL HCL 1.25 MG/0.5ML IN NEBU
1.2500 mg | INHALATION_SOLUTION | Freq: Four times a day (QID) | RESPIRATORY_TRACT | Status: DC | PRN
  Administered 2019-01-22 – 2019-01-24 (×2): 1.25 mg via RESPIRATORY_TRACT
  Filled 2019-01-21 (×2): qty 0.5

## 2019-01-21 MED ORDER — ALBUTEROL SULFATE (2.5 MG/3ML) 0.083% IN NEBU: 2.5000 mg | INHALATION_SOLUTION | Freq: Four times a day (QID) | RESPIRATORY_TRACT | Status: DC

## 2019-01-21 MED ORDER — SODIUM CHLORIDE 0.9% FLUSH
3.0000 mL | Freq: Once | INTRAVENOUS | Status: AC
  Administered 2019-01-21: 3 mL via INTRAVENOUS

## 2019-01-21 MED ORDER — ONDANSETRON HCL 4 MG PO TABS: 4.0000 mg | ORAL_TABLET | Freq: Four times a day (QID) | ORAL | Status: DC | PRN

## 2019-01-21 MED ORDER — IPRATROPIUM BROMIDE 0.02 % IN SOLN: 0.5000 mg | Freq: Four times a day (QID) | RESPIRATORY_TRACT | Status: DC

## 2019-01-21 MED ORDER — ALBUTEROL SULFATE (2.5 MG/3ML) 0.083% IN NEBU
2.5000 mg | INHALATION_SOLUTION | RESPIRATORY_TRACT | Status: DC | PRN
  Administered 2019-01-21: 2.5 mg via RESPIRATORY_TRACT

## 2019-01-21 MED ORDER — ENOXAPARIN SODIUM 40 MG/0.4ML ~~LOC~~ SOLN
40.0000 mg | SUBCUTANEOUS | Status: DC
  Administered 2019-01-21 – 2019-01-23 (×3): 40 mg via SUBCUTANEOUS
  Filled 2019-01-21 (×3): qty 0.4

## 2019-01-21 MED ORDER — SODIUM CHLORIDE 0.9 % IV BOLUS
1000.0000 mL | Freq: Once | INTRAVENOUS | Status: AC
Start: 2019-01-21 — End: 2019-01-21
  Administered 2019-01-21: 1000 mL via INTRAVENOUS

## 2019-01-21 MED ORDER — ACETAMINOPHEN 650 MG RE SUPP: 650.0000 mg | Freq: Four times a day (QID) | RECTAL | Status: DC | PRN

## 2019-01-21 MED ORDER — OSELTAMIVIR PHOSPHATE 75 MG PO CAPS
75.0000 mg | ORAL_CAPSULE | Freq: Two times a day (BID) | ORAL | Status: DC
  Administered 2019-01-21 – 2019-01-24 (×7): 75 mg via ORAL
  Filled 2019-01-21 (×7): qty 1

## 2019-01-21 MED ORDER — ALBUTEROL SULFATE (2.5 MG/3ML) 0.083% IN NEBU
5.0000 mg | INHALATION_SOLUTION | Freq: Once | RESPIRATORY_TRACT | Status: AC
  Administered 2019-01-21: 5 mg via RESPIRATORY_TRACT
  Filled 2019-01-21: qty 6

## 2019-01-21 MED ORDER — ONDANSETRON HCL 4 MG/2ML IJ SOLN
4.0000 mg | Freq: Once | INTRAMUSCULAR | Status: AC
  Administered 2019-01-21: 4 mg via INTRAVENOUS
  Filled 2019-01-21: qty 2

## 2019-01-21 MED ORDER — ONDANSETRON HCL 4 MG/2ML IJ SOLN
4.0000 mg | Freq: Four times a day (QID) | INTRAMUSCULAR | Status: DC | PRN
  Administered 2019-01-21 – 2019-01-24 (×6): 4 mg via INTRAVENOUS
  Filled 2019-01-21 (×6): qty 2

## 2019-01-21 MED ORDER — KCL IN DEXTROSE-NACL 20-5-0.45 MEQ/L-%-% IV SOLN
INTRAVENOUS | Status: DC
  Administered 2019-01-21 – 2019-01-22 (×2): via INTRAVENOUS
  Filled 2019-01-21 (×2): qty 1000

## 2019-01-21 NOTE — Progress Notes (Signed)
RN   Tech asked me to check on patient who has removed mask. Pt. Claims that mask "fell off" by itself. After a more in depth conversation patient admits he removed mas in attempt to drink. Once again educated patient on dangers of removing mask without assistance.RN made aware of situation.

## 2019-01-21 NOTE — ED Notes (Signed)
ED TO INPATIENT HANDOFF REPORT  Name/Age/Gender Wayne Hawkins 48 y.o. male  Code Status Code Status History    Date Active Date Inactive Code Status Order ID Comments User Context   07/28/2014 1602 07/30/2014 1723 Full Code [id number]  Genelle Gather, MD Inpatient      Home/SNF/Other Home  Chief Complaint [facility]  Level of Care/Admitting Diagnosis ED Disposition    ED Disposition Condition Comment   Admit  Hospital Area: [facility] [[id number]]  Level of Care: Med-Surg [16]  Diagnosis: COPD (chronic obstructive pulmonary disease) with acute bronchitis [facility]) [196222]  Admitting Physician: Rolly Salter [[id number]]  Attending Physician: Rolly Salter [[id number]]  PT Class (Do Not Modify): Observation [104]  PT Acc Code (Do Not Modify): Observation [10022]       Medical History Past Medical History:  Diagnosis Date  . Asthma   . Seizures (HCC)     Allergies No Known Allergies  IV Location/Drains/Wounds Patient Lines/Drains/Airways Status   Active Line/Drains/Airways    Name:   Placement date:   Placement time:   Site:   Days:   Peripheral IV 01/21/19 Left Forearm   01/21/19    1229    Forearm   less than 1   Peripheral IV 01/21/19 Right Forearm   01/21/19    1200    Forearm   less than 1          Labs/Imaging Results for orders placed or performed during the hospital encounter of 01/21/19 (from the past 48 hour(s))  Lactic acid, plasma     Status: None   Collection Time: 01/21/19 12:38 PM  Result Value Ref Range   Lactic Acid, Venous 1.9 0.5 - 1.9 mmol/L    Comment: Performed at [facility], [address] [place], [place] [address]  Comprehensive metabolic panel     Status: Abnormal   Collection Time: 01/21/19 12:44 PM  Result Value Ref Range   Sodium 137 135 - 145 mmol/L   Potassium 3.1 (L) 3.5 - 5.1 mmol/L   Chloride 106 98 - 111 mmol/L   CO2 20 (L) 22 - 32 mmol/L   Glucose, Bld 173 (H) 70 - 99 mg/dL   BUN 8 6 - 20  mg/dL   Creatinine, Ser 1.85 0.61 - 1.24 mg/dL   Calcium 9.1 8.9 - 63.1 mg/dL   Total Protein 7.7 6.5 - 8.1 g/dL   Albumin 4.6 3.5 - 5.0 g/dL   AST 17 15 - 41 U/L   ALT 13 0 - 44 U/L   Alkaline Phosphatase 48 38 - 126 U/L   Total Bilirubin 0.6 0.3 - 1.2 mg/dL   GFR calc non Af Amer >60 >60 mL/min   GFR calc Af Amer >60 >60 mL/min   Anion gap 11 5 - 15    Comment: Performed at [facility], [address] [place], [place] [address]  CBC with Differential     Status: Abnormal   Collection Time: 01/21/19 12:44 PM  Result Value Ref Range   WBC 8.3 4.0 - 10.5 K/uL   RBC 4.69 4.22 - 5.81 MIL/uL   Hemoglobin 14.8 13.0 - 17.0 g/dL   HCT 63.7 85.8 - 85.0 %   MCV 94.5 80.0 - 100.0 fL   MCH 31.6 26.0 - 34.0 pg   MCHC 33.4 30.0 - 36.0 g/dL   RDW 27.7 41.2 - 87.8 %   Platelets 238 150 - 400 K/uL   nRBC 0.0 0.0 - 0.2 %  Neutrophils Relative % 73 %   Neutro Abs 6.0 1.7 - 7.7 K/uL   Lymphocytes Relative 14 %   Lymphs Abs 1.2 0.7 - 4.0 K/uL   Monocytes Relative 13 %   Monocytes Absolute 1.1 (H) 0.1 - 1.0 K/uL   Eosinophils Relative 0 %   Eosinophils Absolute 0.0 0.0 - 0.5 K/uL   Basophils Relative 0 %   Basophils Absolute 0.0 0.0 - 0.1 K/uL   Immature Granulocytes 0 %   Abs Immature Granulocytes 0.02 0.00 - 0.07 K/uL    Comment: Performed at [facility], [address], [place], [place] [address]  Protime-INR     Status: None   Collection Time: 01/21/19 12:44 PM  Result Value Ref Range   Prothrombin Time 12.5 11.4 - 15.2 seconds   INR 0.9 0.8 - 1.2    Comment: (NOTE) INR goal varies based on device and disease states. Performed at [facility], [address] [place], [place] [address]   Urinalysis, Routine w reflex microscopic     Status: Abnormal   Collection Time: 01/21/19 12:44 PM  Result Value Ref Range   Color, Urine STRAW (A) YELLOW   APPearance CLEAR CLEAR   Specific Gravity, Urine 1.008 1.005 - 1.030   pH 6.0  5.0 - 8.0   Glucose, UA 50 (A) NEGATIVE mg/dL   Hgb urine dipstick NEGATIVE NEGATIVE   Bilirubin Urine NEGATIVE NEGATIVE   Ketones, ur 5 (A) NEGATIVE mg/dL   Protein, ur NEGATIVE NEGATIVE mg/dL   Nitrite NEGATIVE NEGATIVE   Leukocytes,Ua NEGATIVE NEGATIVE    Comment: Performed at [facility], [address] [place], [place] [address]  Influenza panel by PCR (type A & B)     Status: Abnormal   Collection Time: 01/21/19 12:44 PM  Result Value Ref Range   Influenza A By PCR POSITIVE (A) NEGATIVE   Influenza B By PCR NEGATIVE NEGATIVE    Comment: (NOTE) The Xpert Xpress Flu assay is intended as an aid in the diagnosis of  influenza and should not be used as a sole basis for treatment.  This  assay is FDA approved for nasopharyngeal swab specimens only. Nasal  washings and aspirates are unacceptable for Xpert Xpress Flu testing. Performed at [facility], [address] [place], [place] [address]    Dg Chest 2 View  Result Date: 01/21/2019 CLINICAL DATA:  Fever and chills for several days, initial encounter EXAM: CHEST - 2 VIEW COMPARISON:  09/23/2016 FINDINGS: The heart size and mediastinal contours are within normal limits. Both lungs are clear. The visualized skeletal structures are unremarkable. IMPRESSION: No active cardiopulmonary disease. Electronically Signed   By: Alcide Clever M.D.   On: 01/21/2019 13:56    Pending Labs Unresulted Labs (From admission, onward)    Start     Ordered   01/21/19 1238  Lactic acid, plasma  Now then every 2 hours,   STAT     01/21/19 1237   01/21/19 1238  Culture, blood (Routine x 2)  BLOOD CULTURE X 2,   STAT     01/21/19 1237          Vitals/Pain Today's Vitals   01/21/19 1300 01/21/19 1358 01/21/19 1400 01/21/19 1430  BP: 121/75 (!) 106/54 131/78 133/81  Pulse: (!) 116 (!) 108 (!) 111 (!) 118  Resp: 13 20 (!) 23 (!) 22  Temp:      TempSrc:      SpO2: 93% 100%  99% 96%  Weight:      Height:       PainSc:        Isolation Precautions Droplet precaution  Medications Medications  oseltamivir (TAMIFLU) capsule 75 mg (75 mg Oral Given 01/21/19 1506)  sodium chloride flush (NS) 0.9 % injection 3 mL (3 mLs Intravenous Given 01/21/19 1300)  sodium chloride 0.9 % bolus 1,000 mL (1,000 mLs Intravenous New Bag/Given 01/21/19 1259)  LORazepam (ATIVAN) injection 1 mg (1 mg Intravenous Given 01/21/19 1345)  albuterol (PROVENTIL) (2.5 MG/3ML) 0.083% nebulizer solution 5 mg (5 mg Nebulization Given 01/21/19 1352)    Mobility walks

## 2019-01-21 NOTE — ED Triage Notes (Signed)
Pt to ED via EMS  Per EMS- HX of COPD and asthma, EMS reports wheezing albuterol, solumedrol  given.  Pt Off meds. Per EMS pt reports he thinks he has pneumonia. VS :Temp 101 treated with Tylenol. 94 % RA. 142/84 P 125. RR24, Per EMS work of breathing improved since albuterol treatment.

## 2019-01-21 NOTE — ED Provider Notes (Signed)
[facility]-EMERGENCY DEPT Provider Note   CSN: [id number] Arrival date & time: 01/21/19  1208    History   Chief Complaint Chief Complaint  Patient presents with  . Shortness of Breath    HPI Wayne Hawkins is a 48 y.o. male.     HPI Patient presents with shortness of breath and cough.  Has had for last 3 or 4 days.  Mild sputum production.  History of asthma and COPD.  States he has to use his albuterol every day.  States he called his primary care doctor and was told to come into the hospital.  Fevers up to 102 at home.  Given nebulizer and Solu-Medrol by EMS.  Along with Tylenol.  Reportedly feels somewhat better now.  Does not smoke but was a former heavy smoker.  No definite sick contacts.  Also complaining of headache and pain in his right ear. Past Medical History:  Diagnosis Date  . Asthma   . Seizures [facility])     Patient Active Problem List   Diagnosis Date Noted  . Marijuana dependence (HCC) 07/29/2014  . Severe pain of left shoulder 07/29/2014  . Bilateral groin pain 07/29/2014  . Anxiety state, unspecified 07/29/2014  . Observed seizure-like activity (HCC) 07/28/2014    No past surgical history on file.      Home Medications    Prior to Admission medications   Medication Sig Start Date End Date Taking? Authorizing Provider  ALBUTEROL IN Inhale 1 Act into the lungs daily as needed.    [provider]  diphenhydrAMINE (BENADRYL) 25 MG tablet Take 25 mg by mouth every 6 (six) hours as needed for allergies.    [provider]  HYDROcodone-acetaminophen (NORCO/VICODIN) 5-325 MG tablet Take 1 tablet by mouth every 4 (four) hours as needed. 01/30/18   Jacalyn Lefevre, MD  levETIRAcetam (KEPPRA) 500 MG tablet Take 1 tablet (500 mg total) by mouth 2 (two) times daily. Patient not taking: Reported on 01/30/2018 01/05/16   Nelva Nay, MD  metoprolol succinate (TOPROL-XL) 25 MG 24 hr tablet Take 1 tablet (25 mg total) by mouth  daily. Patient not taking: Reported on 01/30/2018 09/23/16   Wallis Bamberg, PA-C  ondansetron (ZOFRAN ODT) 4 MG disintegrating tablet Take 1 tablet (4 mg total) by mouth every 8 (eight) hours as needed. 01/30/18   Jacalyn Lefevre, MD    Family History No family history on file.  Social History Social History   Tobacco Use  . Smoking status: Never Smoker  Substance Use Topics  . Alcohol use: No  . Drug use: Not on file     Allergies   Patient has no known allergies.   Review of Systems Review of Systems  Constitutional: Positive for appetite change, chills and fever.  HENT: Negative for congestion.   Respiratory: Positive for cough, shortness of breath and wheezing.   Cardiovascular: Negative for chest pain and leg swelling.  Gastrointestinal: Negative for abdominal pain.  Genitourinary: Negative for flank pain.  Musculoskeletal: Positive for back pain and myalgias.  Skin: Negative for rash.  Neurological: Positive for headaches. Negative for weakness.  Psychiatric/Behavioral: Negative for confusion.     Physical Exam Updated Vital Signs BP 133/81   Pulse (!) 118   Temp 100.3 F (37.9 C) (Rectal) Comment: Tylenol 1000mg  given PTA by EMS  Resp (!) 22   Ht 6' (1.829 m)   Wt 81.2 kg   SpO2 96%   BMI 24.28 kg/m   Physical Exam HENT:  Head: Atraumatic.  Neck:     Musculoskeletal: Neck supple.     Comments: No meningismus. Cardiovascular:     Rate and Rhythm: Tachycardia present.  Pulmonary:     Comments: Tachypnea.  Diffuse wheezes and prolonged expirations. Chest:     Chest wall: No tenderness.  Skin:    General: Skin is warm.     Capillary Refill: Capillary refill takes less than 2 seconds.  Neurological:     General: No focal deficit present.     Mental Status: He is alert.      ED Treatments / Results  Labs (all labs ordered are listed, but only abnormal results are displayed) Labs Reviewed  COMPREHENSIVE METABOLIC PANEL - Abnormal; Notable for  the following components:      Result Value   Potassium 3.1 (*)    CO2 20 (*)    Glucose, Bld 173 (*)    All other components within normal limits  CBC WITH DIFFERENTIAL/PLATELET - Abnormal; Notable for the following components:   Monocytes Absolute 1.1 (*)    All other components within normal limits  INFLUENZA PANEL BY PCR (TYPE A & B) - Abnormal; Notable for the following components:   Influenza A By PCR POSITIVE (*)    All other components within normal limits  CULTURE, BLOOD (ROUTINE X 2)  CULTURE, BLOOD (ROUTINE X 2)  LACTIC ACID, PLASMA  PROTIME-INR  LACTIC ACID, PLASMA  URINALYSIS, ROUTINE W REFLEX MICROSCOPIC    EKG None  Radiology Dg Chest 2 View  Result Date: 01/21/2019 CLINICAL DATA:  Fever and chills for several days, initial encounter EXAM: CHEST - 2 VIEW COMPARISON:  09/23/2016 FINDINGS: The heart size and mediastinal contours are within normal limits. Both lungs are clear. The visualized skeletal structures are unremarkable. IMPRESSION: No active cardiopulmonary disease. Electronically Signed   By: Alcide Clever M.D.   On: 01/21/2019 13:56    Procedures Procedures (including critical care time)  Medications Ordered in ED Medications  sodium chloride flush (NS) 0.9 % injection 3 mL (3 mLs Intravenous Given 01/21/19 1300)  sodium chloride 0.9 % bolus 1,000 mL (1,000 mLs Intravenous New Bag/Given 01/21/19 1259)  LORazepam (ATIVAN) injection 1 mg (1 mg Intravenous Given 01/21/19 1345)  albuterol (PROVENTIL) (2.5 MG/3ML) 0.083% nebulizer solution 5 mg (5 mg Nebulization Given 01/21/19 1352)     Initial Impression / Assessment and Plan / ED Course  I have reviewed the triage vital signs and the nursing notes.  Pertinent labs & imaging results that were available during my care of the patient were reviewed by me and considered in my medical decision making (see chart for details).        Patient presents with shortness of breath cough and fever.  History of COPD.   Patient states he is rather severe with his COPD.  Also had anxiety attack wise here.  However is continued dyspnea continued tachycardia.  X-ray reassuring but influenza positive.  With continued shortness of breath will admit.  Final Clinical Impressions(s) / ED Diagnoses   Final diagnoses:  Influenza A  COPD exacerbation [facility])    ED Discharge Orders    None       Benjiman Core, MD 01/21/19 1456

## 2019-01-21 NOTE — ED Notes (Signed)
Pt in room vomiting , anxious.

## 2019-01-21 NOTE — ED Notes (Signed)
Pt had a brief anxiety attack after returning from x-ray, obtained order for Ativan 1 mg, upon re entering room pt with audible wheezing stating her could not breath, able to obtain order for Albuterol tx. Emotional support provided for pt which helped to calm him.

## 2019-01-21 NOTE — ED Notes (Signed)
Bed: WA17 Expected date:  Expected time:  Means of arrival:  Comments: 48 yo SOB, cough-nebs, solumedrol given

## 2019-01-21 NOTE — ED Triage Notes (Signed)
Pt reports feeling bad over past 3 days. Pt reports fever, chills, HA, and difficulty breathing. Pt reports he has not been taking Albuterol at home over past 2 weeks. On assessment pt anxious, c/o nausea . RA 96 % P 129

## 2019-01-21 NOTE — H&P (Signed)
[facility] History and Physical   Patient: Wayne Hawkins YEM:[id number]   PCP: Patient, No Pcp Per DOB: 12-04-1970   DOA: 01/21/2019   DOS: 01/21/2019   DOS: the patient was seen and examined on 01/21/2019  Patient coming from: The patient is coming from home.  Chief Complaint: Shortness of breath  HPI: Wayne Hawkins is a 48 y.o. male with Past medical history of COPD, asthma, marijuana abuse, seizure. Patient presents to the ED with complaints of cough and shortness of breath ongoing for last 3 days. Had fever at home as well as with chills. Reports dry cough with chest pain secondary to coughing. Called EMS and was brought to the hospital. In the ED reports severe nausea as well as anxiety.  Has been dry heaving. Denies any abdominal pain. Diarrhea or constipation reported at home.  ED Course: Presents to the hospital with, fever, tachycardia, tachypnea.  Influenza is positive.  Patient becomes more symptomatic with nausea and vomiting and anxiety as well as hypoxia and was referred for admission  At his baseline ambulates without support And is independent for most of his ADL; manages his medication on his own.  Review of Systems: as mentioned in the history of present illness.  All other systems reviewed and are negative.  Past Medical History:  Diagnosis Date  . Asthma   . COPD (chronic obstructive pulmonary disease) (HCC)   . Seizures (HCC)    No past surgical history on file. Social History:  reports that he quit smoking about 2 years ago. His smoking use included cigarettes. He has a 16.00 pack-year smoking history. He does not have any smokeless tobacco history on file. He reports previous drug use. Drug: Marijuana. He reports that he does not drink alcohol.  No Known Allergies   Family History  Problem Relation Age of Onset  . Hypertension Mother   . Hypertension Father      Prior to Admission medications   Medication Sig Start Date End Date Taking?  Authorizing Provider  albuterol (PROVENTIL HFA;VENTOLIN HFA) 108 (90 Base) MCG/ACT inhaler Inhale 2 puffs into the lungs every 6 (six) hours as needed for wheezing or shortness of breath.  12/25/18  Yes [provider]  albuterol (PROVENTIL) (2.5 MG/3ML) 0.083% nebulizer solution Take 2.5 mg by nebulization every 6 (six) hours as needed for wheezing or shortness of breath.  09/07/18  Yes [provider]  diphenhydrAMINE (BENADRYL) 25 MG tablet Take 25 mg by mouth every 6 (six) hours as needed for allergies.   Yes [provider]  ondansetron (ZOFRAN ODT) 4 MG disintegrating tablet Take 1 tablet (4 mg total) by mouth every 8 (eight) hours as needed. Patient taking differently: Take 4 mg by mouth every 8 (eight) hours as needed for nausea or vomiting.  01/30/18  Yes Jacalyn Lefevre, MD  HYDROcodone-acetaminophen (NORCO/VICODIN) 5-325 MG tablet Take 1 tablet by mouth every 4 (four) hours as needed. Patient not taking: Reported on 01/21/2019 01/30/18   Jacalyn Lefevre, MD  levETIRAcetam (KEPPRA) 500 MG tablet Take 1 tablet (500 mg total) by mouth 2 (two) times daily. Patient not taking: Reported on 01/30/2018 01/05/16   Nelva Nay, MD  metoprolol succinate (TOPROL-XL) 25 MG 24 hr tablet Take 1 tablet (25 mg total) by mouth daily. Patient not taking: Reported on 01/30/2018 09/23/16   Wallis Bamberg, [place]    Physical Exam: Vitals:   01/21/19 1530 01/21/19 1554 01/21/19 1630 01/21/19 1809  BP: 121/74  (!) 119/100   Pulse: (!) 118  Marland Kitchen)  130   Resp: 18  (!) 25   Temp:  98.5 F (36.9 C)    TempSrc:  Oral    SpO2: 96%  96% 96%  Weight:      Height:        General: Alert, Awake and Oriented to Time, Place and Person. Appear in marked distress, affect appropriate Eyes: PERRL, Conjunctiva normal ENT: Oral Mucosa clear moist. Neck: no JVD, no Abnormal Mass Or lumps Cardiovascular: S1 and S2 Present, no Murmur, Peripheral Pulses Present Respiratory: normal respiratory effort,  Bilateral Air entry equal and Decreased, no use of accessory muscle, no Crackles, bilateral  wheezes Abdomen: Bowel Sound present, Soft and no tenderness, no hernia Skin: no redness, no Rash, no induration Extremities: no Pedal edema, no calf tenderness Neurologic: Grossly no focal neuro deficit. Bilaterally Equal motor strength  Labs on Admission:  CBC: Recent Labs  Lab 01/21/19 1244  WBC 8.3  NEUTROABS 6.0  HGB 14.8  HCT 44.3  MCV 94.5  PLT 238   Basic Metabolic Panel: Recent Labs  Lab 01/21/19 1244  NA 137  K 3.1*  CL 106  CO2 20*  GLUCOSE 173*  BUN 8  CREATININE 0.92  CALCIUM 9.1   GFR: Estimated Creatinine Clearance: 107.8 mL/min (by C-G formula based on SCr of 0.92 mg/dL). Liver Function Tests: Recent Labs  Lab 01/21/19 1244  AST 17  ALT 13  ALKPHOS 48  BILITOT 0.6  PROT 7.7  ALBUMIN 4.6   No results for input(s): LIPASE, AMYLASE in the last 168 hours. No results for input(s): AMMONIA in the last 168 hours. Coagulation Profile: Recent Labs  Lab 01/21/19 1244  INR 0.9   Cardiac Enzymes: No results for input(s): CKTOTAL, CKMB, CKMBINDEX, TROPONINI in the last 168 hours. BNP (last 3 results) No results for input(s): PROBNP in the last 8760 hours. HbA1C: No results for input(s): HGBA1C in the last 72 hours. CBG: No results for input(s): GLUCAP in the last 168 hours. Lipid Profile: No results for input(s): CHOL, HDL, LDLCALC, TRIG, CHOLHDL, LDLDIRECT in the last 72 hours. Thyroid Function Tests: No results for input(s): TSH, T4TOTAL, FREET4, T3FREE, THYROIDAB in the last 72 hours. Anemia Panel: No results for input(s): VITAMINB12, FOLATE, FERRITIN, TIBC, IRON, RETICCTPCT in the last 72 hours. Urine analysis:    Component Value Date/Time   COLORURINE STRAW (A) 01/21/2019 1244   APPEARANCEUR CLEAR 01/21/2019 1244   LABSPEC 1.008 01/21/2019 1244   PHURINE 6.0 01/21/2019 1244   GLUCOSEU 50 (A) 01/21/2019 1244   HGBUR NEGATIVE 01/21/2019 1244    BILIRUBINUR NEGATIVE 01/21/2019 1244   KETONESUR 5 (A) 01/21/2019 1244   PROTEINUR NEGATIVE 01/21/2019 1244   UROBILINOGEN 0.2 07/28/2014 1116   NITRITE NEGATIVE 01/21/2019 1244   LEUKOCYTESUR NEGATIVE 01/21/2019 1244    Radiological Exams on Admission: Dg Chest 2 View  Result Date: 01/21/2019 CLINICAL DATA:  Fever and chills for several days, initial encounter EXAM: CHEST - 2 VIEW COMPARISON:  09/23/2016 FINDINGS: The heart size and mediastinal contours are within normal limits. Both lungs are clear. The visualized skeletal structures are unremarkable. IMPRESSION: No active cardiopulmonary disease. Electronically Signed   By: Alcide Clever M.D.   On: 01/21/2019 13:56   Assessment/Plan 1.  Influenza A sepsis. COPD with acute exacerbation secondary to bronchitis. Sinus tachycardia. Nausea and vomiting. Patient presents with complaints of cough and shortness of breath and started having nausea and vomiting. Hypoxic and in severe respiratory stress. Bronchospasm on examination with bilateral expiratory wheezing. Tachypneic with heart  rate going as well as respiratory going to times of the normal. With this we will keep the patient in the hospital stepdown unit. Continue with IV hydration.  Provide normal saline bolus. DuoNeb's, Tamiflu, Mucinex, IV Solu-Medrol. Supportive measures with nausea medicine. Aspiration precaution.  2.  History of marijuana abuse. Check UDS.  Nutrition: Regular diet DVT Prophylaxis: subcutaneous Heparin  Advance goals of care discussion: full code   Consults: none  Family Communication: no family was present at bedside, at the time of interview.  Disposition: Admitted as inpatient, step-down unit. Likely to be discharged home, in 34 days.  Author: Lynden OxfordPranav Lauralee Waters, MD [facility] 01/21/2019  To reach On-call, see care teams to locate the attending and reach out to them via [email or web address]. If 7PM-7AM, please contact night-coverage If you still  have difficulty reaching the attending provider, please page the [facility] (Director on Call) for [facility] on amion for assistance.

## 2019-01-22 DIAGNOSIS — J441 Chronic obstructive pulmonary disease with (acute) exacerbation: Secondary | ICD-10-CM

## 2019-01-22 LAB — COMPREHENSIVE METABOLIC PANEL
ALT: 15 U/L (ref 0–44)
AST: 19 U/L (ref 15–41)
Albumin: 4.2 g/dL (ref 3.5–5.0)
Alkaline Phosphatase: 39 U/L (ref 38–126)
Anion gap: 6 (ref 5–15)
BUN: 9 mg/dL (ref 6–20)
CO2: 23 mmol/L (ref 22–32)
Calcium: 8.5 mg/dL — ABNORMAL LOW (ref 8.9–10.3)
Chloride: 112 mmol/L — ABNORMAL HIGH (ref 98–111)
Creatinine, Ser: 0.91 mg/dL (ref 0.61–1.24)
GFR calc non Af Amer: >60 mL/min (ref >60)
Glucose, Bld: 143 mg/dL — ABNORMAL HIGH (ref 70–99)
Potassium: 3.9 mmol/L (ref 3.5–5.1)
Sodium: 141 mmol/L (ref 135–145)
Total Bilirubin: 0.5 mg/dL (ref 0.3–1.2)
Total Protein: 6.8 g/dL (ref 6.5–8.1)

## 2019-01-22 LAB — CBC
HCT: 39.4 % (ref 39.0–52.0)
Hemoglobin: 12.9 g/dL — ABNORMAL LOW (ref 13.0–17.0)
MCH: 31.3 pg (ref 26.0–34.0)
MCHC: 32.7 g/dL (ref 30.0–36.0)
MCV: 95.6 fL (ref 80.0–100.0)
Platelets: 222 10*3/uL (ref 150–400)
RBC: 4.12 MIL/uL — ABNORMAL LOW (ref 4.22–5.81)
RDW: 13 % (ref 11.5–15.5)
WBC: 6.9 10*3/uL (ref 4.0–10.5)
nRBC: 0 % (ref 0.0–0.2)

## 2019-01-22 LAB — HIV ANTIBODY (ROUTINE TESTING W REFLEX): HIV Screen 4th Generation wRfx: NONREACTIVE

## 2019-01-22 MED ORDER — IPRATROPIUM-ALBUTEROL 0.5-2.5 (3) MG/3ML IN SOLN
3.0000 mL | Freq: Three times a day (TID) | RESPIRATORY_TRACT | Status: DC
  Administered 2019-01-22 – 2019-01-23 (×6): 3 mL via RESPIRATORY_TRACT
  Filled 2019-01-22 (×7): qty 3

## 2019-01-22 MED ORDER — CYCLOBENZAPRINE HCL 5 MG PO TABS: 5.0000 mg | ORAL_TABLET | Freq: Three times a day (TID) | ORAL | Status: DC | PRN

## 2019-01-22 MED ORDER — AZITHROMYCIN 250 MG PO TABS
500.0000 mg | ORAL_TABLET | Freq: Every day | ORAL | Status: DC
  Administered 2019-01-22 – 2019-01-23 (×2): 500 mg via ORAL
  Filled 2019-01-22 (×3): qty 2

## 2019-01-22 MED ORDER — METHYLPREDNISOLONE SODIUM SUCC 125 MG IJ SOLR
60.0000 mg | Freq: Four times a day (QID) | INTRAMUSCULAR | Status: DC
  Administered 2019-01-22 – 2019-01-23 (×4): 60 mg via INTRAVENOUS
  Filled 2019-01-22 (×4): qty 2

## 2019-01-22 NOTE — Progress Notes (Signed)
PROGRESS NOTE    Wayne Hawkins  IFO:[id number] DOB: November 30, 1970 DOA: 01/21/2019 PCP: Patient, No Pcp Per    Brief Narrative:   Wayne Hawkins is a 48 y.o. male with Past medical history of COPD, asthma, marijuana abuse, seizure. Patient presents to the ED with complaints of cough and shortness of breath ongoing for last 3 days, associated with fever and chills. He was found to have influenza A and copd exacerbation.  Assessment & Plan:   Principal Problem:   Influenza A Active Problems:   COPD (chronic obstructive pulmonary disease) with acute bronchitis (HCC)   Acute respiratory failure with hypoxia secondary to acute copd exacerbation with acute bronchitis and influenza A  Continue with IV solumedrol, duonebs, tamiflu.    H/o Marijuana abuse:  Symptomatic management.     DVT prophylaxis: Lovenox.  Code Status:full code.  Family Communication: None at bedside.  Disposition Plan: pending clinical improvement.   Consultants:  None.   Procedures: none  Antimicrobials:  tamiflu  Subjective: Pt reports spasms , persistent cough and sob.  Not back to baseline.   Objective: Vitals:   01/22/19 0500 01/22/19 0700 01/22/19 0815 01/22/19 0833  BP: 136/86 107/70  137/68  Pulse: (!) 101 96  (!) 105  Resp: (!) 23   20  Temp:      TempSrc:      SpO2: 98% 97% 95% 97%  Weight:      Height:        Intake/Output Summary (Last 24 hours) at 01/22/2019 0841 Last data filed at 01/22/2019 0555 Gross per 24 hour  Intake 3040.29 ml  Output 1650 ml  Net 1390.29 ml   Filed Weights   01/21/19 1224  Weight: 81.2 kg    Examination:  General exam: mild distress from sob and coughing.  Respiratory system: bilateral wheezing both anteriorly and posteriorly.  Cardiovascular system: S1 & S2 heard, tachycardic.  Gastrointestinal system: Abdomen is nondistended, soft and nontender. No organomegaly or masses felt. Normal bowel sounds heard. Central nervous system: Alert and oriented. No  focal neurological deficits. Extremities: Symmetric 5 x 5 power. Skin: No rashes, lesions or ulcers Psychiatry: . Mood & affect appropriate.     Data Reviewed: I have personally reviewed following labs and imaging studies  CBC: Recent Labs  Lab 01/21/19 1244 01/22/19 0353  WBC 8.3 6.9  NEUTROABS 6.0  --   HGB 14.8 12.9*  HCT 44.3 39.4  MCV 94.5 95.6  PLT 238 222   Basic Metabolic Panel: Recent Labs  Lab 01/21/19 1244 01/22/19 0353  NA 137 141  K 3.1* 3.9  CL 106 112*  CO2 20* 23  GLUCOSE 173* 143*  BUN 8 9  CREATININE 0.92 0.91  CALCIUM 9.1 8.5*   GFR: Estimated Creatinine Clearance: 109 mL/min (by C-G formula based on SCr of 0.91 mg/dL). Liver Function Tests: Recent Labs  Lab 01/21/19 1244 01/22/19 0353  AST 17 19  ALT 13 15  ALKPHOS 48 39  BILITOT 0.6 0.5  PROT 7.7 6.8  ALBUMIN 4.6 4.2   No results for input(s): LIPASE, AMYLASE in the last 168 hours. No results for input(s): AMMONIA in the last 168 hours. Coagulation Profile: Recent Labs  Lab 01/21/19 1244  INR 0.9   Cardiac Enzymes: No results for input(s): CKTOTAL, CKMB, CKMBINDEX, TROPONINI in the last 168 hours. BNP (last 3 results) No results for input(s): PROBNP in the last 8760 hours. HbA1C: No results for input(s): HGBA1C in the last 72 hours. CBG: No results for  input(s): GLUCAP in the last 168 hours. Lipid Profile: No results for input(s): CHOL, HDL, LDLCALC, TRIG, CHOLHDL, LDLDIRECT in the last 72 hours. Thyroid Function Tests: No results for input(s): TSH, T4TOTAL, FREET4, T3FREE, THYROIDAB in the last 72 hours. Anemia Panel: No results for input(s): VITAMINB12, FOLATE, FERRITIN, TIBC, IRON, RETICCTPCT in the last 72 hours. Sepsis Labs: Recent Labs  Lab 01/21/19 1238  LATICACIDVEN 1.9    Recent Results (from the past 240 hour(s))  MRSA PCR Screening     Status: None   Collection Time: 01/21/19  5:43 PM  Result Value Ref Range Status   MRSA by PCR NEGATIVE NEGATIVE Final      Comment:        The GeneXpert MRSA Assay (FDA approved for NASAL specimens only), is one component of a comprehensive MRSA colonization surveillance program. It is not intended to diagnose MRSA infection nor to guide or monitor treatment for MRSA infections. Performed at [facility], [address], [place], [place] [address]          Radiology Studies: Dg Chest 2 View  Result Date: 01/21/2019 CLINICAL DATA:  Fever and chills for several days, initial encounter EXAM: CHEST - 2 VIEW COMPARISON:  09/23/2016 FINDINGS: The heart size and mediastinal contours are within normal limits. Both lungs are clear. The visualized skeletal structures are unremarkable. IMPRESSION: No active cardiopulmonary disease. Electronically Signed   By: Alcide Clever M.D.   On: 01/21/2019 13:56        Scheduled Meds: . albuterol  5 mg Nebulization Once  . enoxaparin (LOVENOX) injection  40 mg Subcutaneous Q24H  . guaiFENesin  600 mg Oral BID  . ipratropium-albuterol  3 mL Nebulization TID  . methylPREDNISolone (SOLU-MEDROL) injection  40 mg Intravenous Q12H  . oseltamivir  75 mg Oral BID  . sodium chloride flush  3 mL Intravenous Q12H   Continuous Infusions: . dextrose 5 % and 0.45 % NaCl with KCl 20 mEq/L 100 mL/hr at 01/22/19 0515     LOS: 1 day       Kathlen Mody, MD [facility] Pager [phone]   If 7PM-7AM, please contact night-coverage [email or web address] Password [facility] 01/22/2019, 8:41 AM

## 2019-01-22 NOTE — Progress Notes (Signed)
Pt has arrived to Room 1507 from ICU. Remains on 2L/min O2 via [place], with sats at 95%. Pt alert and calm.

## 2019-01-22 NOTE — Progress Notes (Signed)
Pt frustrated, states that he cannot sleep with bipap and it's making him anxious. Placed back on nasal cannula. Will continue to monitor at this time

## 2019-01-23 ENCOUNTER — Encounter (HOSPITAL_COMMUNITY): Payer: Self-pay

## 2019-01-23 MED ORDER — METHYLPREDNISOLONE SODIUM SUCC 125 MG IJ SOLR
60.0000 mg | INTRAMUSCULAR | Status: DC
  Administered 2019-01-24: 60 mg via INTRAVENOUS
  Filled 2019-01-23: qty 2

## 2019-01-23 NOTE — Progress Notes (Signed)
PROGRESS NOTE    Laquane Delaughter  RWE:[id number] DOB: 07/29/1971 DOA: 01/21/2019 PCP: Patient, No Pcp Per    Brief Narrative:   Desmen Goetter is a 48 y.o. male with Past medical history of COPD, asthma, marijuana abuse, seizure. Patient presents to the ED with complaints of cough and shortness of breath ongoing for last 3 days, associated with fever and chills. He was found to have influenza A and copd exacerbation.    Assessment & Plan:   Principal Problem:   Influenza A Active Problems:   COPD (chronic obstructive pulmonary disease) with acute bronchitis (HCC)   Acute respiratory failure with hypoxia secondary to acute copd exacerbation with acute bronchitis and influenza A  Wheezing has improved, will start tapering his steroids.  He is still hypoxic requiring upto 1 lit of [place] oxygen to keep sats greater than 90%.  Continue with  duonebs, tamiflu and zithromax for acute bronchitis.    H/o Marijuana abuse:  Symptomatic management.   Panic disorder:  Prn ativan.   DVT prophylaxis: Lovenox.  Code Status:full code.  Family Communication: None at bedside.  Disposition Plan: pending clinical improvement.   Consultants:  None.   Procedures: none  Antimicrobials:  tamiflu  Subjective: Pt reports spasms , persistent cough and sob.  Not back to baseline.   Objective: Vitals:   01/22/19 2048 01/23/19 0540 01/23/19 0716 01/23/19 1328  BP:  99/62    Pulse:  83    Resp:  16    Temp:  98.1 F (36.7 C)    TempSrc:  Oral    SpO2: 95% 94% 96% 96%  Weight:      Height:        Intake/Output Summary (Last 24 hours) at 01/23/2019 1431 Last data filed at 01/23/2019 0541 Gross per 24 hour  Intake -  Output 1050 ml  Net -1050 ml   Filed Weights   01/21/19 1224  Weight: 81.2 kg    Examination:  General exam: mild distress from anxiety,  Respiratory system: air entry fair bilateral, wheezing has improved.  Cardiovascular system: S1 & S2 heard, tachycardic.    Gastrointestinal system: Abdomen is soft NT ND BS+ Central nervous system: Alert and oriented. No focal neurological deficits. Extremities: Symmetric 5 x 5 power. Skin: No rashes, lesions or ulcers Psychiatry: .Anxious     Data Reviewed: I have personally reviewed following labs and imaging studies  CBC: Recent Labs  Lab 01/21/19 1244 01/22/19 0353  WBC 8.3 6.9  NEUTROABS 6.0  --   HGB 14.8 12.9*  HCT 44.3 39.4  MCV 94.5 95.6  PLT 238 222   Basic Metabolic Panel: Recent Labs  Lab 01/21/19 1244 01/22/19 0353  NA 137 141  K 3.1* 3.9  CL 106 112*  CO2 20* 23  GLUCOSE 173* 143*  BUN 8 9  CREATININE 0.92 0.91  CALCIUM 9.1 8.5*   GFR: Estimated Creatinine Clearance: 109 mL/min (by C-G formula based on SCr of 0.91 mg/dL). Liver Function Tests: Recent Labs  Lab 01/21/19 1244 01/22/19 0353  AST 17 19  ALT 13 15  ALKPHOS 48 39  BILITOT 0.6 0.5  PROT 7.7 6.8  ALBUMIN 4.6 4.2   No results for input(s): LIPASE, AMYLASE in the last 168 hours. No results for input(s): AMMONIA in the last 168 hours. Coagulation Profile: Recent Labs  Lab 01/21/19 1244  INR 0.9   Cardiac Enzymes: No results for input(s): CKTOTAL, CKMB, CKMBINDEX, TROPONINI in the last 168 hours. BNP (last 3 results) No  results for input(s): PROBNP in the last 8760 hours. HbA1C: No results for input(s): HGBA1C in the last 72 hours. CBG: No results for input(s): GLUCAP in the last 168 hours. Lipid Profile: No results for input(s): CHOL, HDL, LDLCALC, TRIG, CHOLHDL, LDLDIRECT in the last 72 hours. Thyroid Function Tests: No results for input(s): TSH, T4TOTAL, FREET4, T3FREE, THYROIDAB in the last 72 hours. Anemia Panel: No results for input(s): VITAMINB12, FOLATE, FERRITIN, TIBC, IRON, RETICCTPCT in the last 72 hours. Sepsis Labs: Recent Labs  Lab 01/21/19 1238  LATICACIDVEN 1.9    Recent Results (from the past 240 hour(s))  Culture, blood (Routine x 2)     Status: None (Preliminary  result)   Collection Time: 01/21/19 12:38 PM  Result Value Ref Range Status   Specimen Description BLOOD LEFT FATTY CASTS  Final   Special Requests   Final    BOTTLES DRAWN AEROBIC AND ANAEROBIC Blood Culture adequate volume Performed at [facility], [address], [place], [place] [address]    Culture NO GROWTH 2 DAYS  Final   Report Status PENDING  Incomplete  Culture, blood (Routine x 2)     Status: None (Preliminary result)   Collection Time: 01/21/19 12:43 PM  Result Value Ref Range Status   Specimen Description BLOOD RIGHT FATTY CASTS  Final   Special Requests   Final    BOTTLES DRAWN AEROBIC AND ANAEROBIC Blood Culture adequate volume Performed at [facility], [address], [place], [place] [address]    Culture NO GROWTH 2 DAYS  Final   Report Status PENDING  Incomplete  MRSA PCR Screening     Status: None   Collection Time: 01/21/19  5:43 PM  Result Value Ref Range Status   MRSA by PCR NEGATIVE NEGATIVE Final    Comment:        The GeneXpert MRSA Assay (FDA approved for NASAL specimens only), is one component of a comprehensive MRSA colonization surveillance program. It is not intended to diagnose MRSA infection nor to guide or monitor treatment for MRSA infections. Performed at [facility], [address] [place], [place] [address]          Radiology Studies: No results found.      Scheduled Meds: . albuterol  5 mg Nebulization Once  . azithromycin  500 mg Oral Daily  . enoxaparin (LOVENOX) injection  40 mg Subcutaneous Q24H  . guaiFENesin  600 mg Oral BID  . ipratropium-albuterol  3 mL Nebulization TID  . methylPREDNISolone (SOLU-MEDROL) injection  60 mg Intravenous Q6H  . oseltamivir  75 mg Oral BID  . sodium chloride flush  3 mL Intravenous Q12H   Continuous Infusions:    LOS: 2 days       Kathlen Mody, MD [facility] Pager [phone]   If 7PM-7AM, please contact  night-coverage [email or web address] Password [facility] 01/23/2019, 2:31 PM

## 2019-01-24 DIAGNOSIS — J209 Acute bronchitis, unspecified: Secondary | ICD-10-CM

## 2019-01-24 DIAGNOSIS — J440 Chronic obstructive pulmonary disease with acute lower respiratory infection: Secondary | ICD-10-CM

## 2019-01-24 MED ORDER — GUAIFENESIN ER 600 MG PO TB12: 600.0000 mg | ORAL_TABLET | Freq: Two times a day (BID) | ORAL | 0 refills | Status: DC

## 2019-01-24 MED ORDER — AZITHROMYCIN 250 MG PO TABS: 500.0000 mg | ORAL_TABLET | Freq: Every day | ORAL | 0 refills | Status: AC

## 2019-01-24 MED ORDER — PREDNISONE 20 MG PO TABS: ORAL_TABLET | ORAL | 0 refills | Status: DC

## 2019-01-24 MED ORDER — IPRATROPIUM-ALBUTEROL 0.5-2.5 (3) MG/3ML IN SOLN: 3.0000 mL | Freq: Three times a day (TID) | RESPIRATORY_TRACT | 3 refills | Status: DC

## 2019-01-24 MED ORDER — OSELTAMIVIR PHOSPHATE 75 MG PO CAPS: 75.0000 mg | ORAL_CAPSULE | Freq: Two times a day (BID) | ORAL | 0 refills | Status: AC

## 2019-01-25 NOTE — Discharge Summary (Signed)
Physician Discharge Summary  Wayne Hawkins ZOX:[id number] DOB: 06-29-1971 DOA: 01/21/2019  PCP: Patient, No Pcp Per  Admit date: 01/21/2019 Discharge date: 01/24/2019  Admitted From: [facility].  Disposition:  Home.    Recommendations for Outpatient Follow-up:  1. Follow up with PCP in 1-2 weeks 2. Please obtain BMP/CBC in one week   Discharge Condition:stable.  CODE STATUS:full code.  Diet recommendation: Heart Healthy   Brief/Interim Summary: Wayne Phillipsis a 48 y.o.malewith Past medical history ofCOPD, asthma, marijuana abuse, seizure. Patient presents to the ED with complaints of cough and shortness of breath ongoing for last 3 days, associated with fever and chills. He was found to have influenza A and copd exacerbation.   Discharge Diagnoses:  Principal Problem:   Influenza A Active Problems:   COPD (chronic obstructive pulmonary disease) with acute bronchitis (HCC)  Acute respiratory failure with hypoxia secondary to acute copd exacerbation with acute bronchitis and influenza A  Wheezing has improved,  started tapering his steroids.  Weaned him off the oxygen.  Discharged on   duonebs, tamiflu and zithromax for acute bronchitis.    H/o Marijuana abuse:  Symptomatic management.   Panic disorder:  Prn ativan.   Discharge Instructions  Discharge Instructions    Diet - low sodium heart healthy   Complete by:  As directed    Discharge instructions   Complete by:  As directed    Follow up with PCP in one week.     Allergies as of 01/24/2019   No Known Allergies     Medication List    STOP taking these medications   HYDROcodone-acetaminophen 5-325 MG tablet Commonly known as:  NORCO/VICODIN   levETIRAcetam 500 MG tablet Commonly known as:  KEPPRA   metoprolol succinate 25 MG 24 hr tablet Commonly known as:  TOPROL-XL   ondansetron 4 MG disintegrating tablet Commonly known as:  ZOFRAN ODT     TAKE these medications   albuterol (2.5 MG/3ML) 0.083%  nebulizer solution Commonly known as:  PROVENTIL Take 2.5 mg by nebulization every 6 (six) hours as needed for wheezing or shortness of breath.   albuterol 108 (90 Base) MCG/ACT inhaler Commonly known as:  PROVENTIL HFA;VENTOLIN HFA Inhale 2 puffs into the lungs every 6 (six) hours as needed for wheezing or shortness of breath.   azithromycin 250 MG tablet Commonly known as:  ZITHROMAX Take 2 tablets (500 mg total) by mouth daily for 3 days.   diphenhydrAMINE 25 MG tablet Commonly known as:  BENADRYL Take 25 mg by mouth every 6 (six) hours as needed for allergies.   guaiFENesin 600 MG 12 hr tablet Commonly known as:  MUCINEX Take 1 tablet (600 mg total) by mouth 2 (two) times daily.   ipratropium-albuterol 0.5-2.5 (3) MG/3ML Soln Commonly known as:  DUONEB Take 3 mLs by nebulization 3 (three) times daily.   oseltamivir 75 MG capsule Commonly known as:  TAMIFLU Take 1 capsule (75 mg total) by mouth 2 (two) times daily for 3 doses.   predniSONE 20 MG tablet Commonly known as:  DELTASONE Prednisone 40 mg daily for 3 days followed by  Prednisone 20 mg daily for 3 days and stop.      Follow-up Information    [facility]. Schedule an appointment as soon as possible for a visit.   Why:  call to get a hospital followup visit and to get a pcp. Contact information: [address] [place] [address] [phone]  No Known Allergies  Consultations:  None.    Procedures/Studies: Dg Chest 2 View  Result Date: 01/21/2019 CLINICAL DATA:  Fever and chills for several days, initial encounter EXAM: CHEST - 2 VIEW COMPARISON:  09/23/2016 FINDINGS: The heart size and mediastinal contours are within normal limits. Both lungs are clear. The visualized skeletal structures are unremarkable. IMPRESSION: No active cardiopulmonary disease. Electronically Signed   By: Alcide Clever M.D.   On: 01/21/2019 13:56       Subjective:  Wheezing improved. Sob resolved.  Discharge Exam: Vitals:   01/24/19 0606 01/24/19 1106  BP: 108/75   Pulse: 72   Resp: 16   Temp: 97.9 F (36.6 C)   SpO2: 96% 98%   Vitals:   01/23/19 2138 01/23/19 2207 01/24/19 0606 01/24/19 1106  BP:  112/84 108/75   Pulse:  88 72   Resp:  20 16   Temp:  97.9 F (36.6 C) 97.9 F (36.6 C)   TempSrc:      SpO2: 92% 93% 96% 98%  Weight:      Height:        General: Pt is alert, awake, not in acute distress Cardiovascular: RRR, S1/S2 +, no rubs, no gallops Respiratory: CTA bilaterally, no wheezing, no rhonchi Abdominal: Soft, NT, ND, bowel sounds + Extremities: no edema, no cyanosis    The results of significant diagnostics from this hospitalization (including imaging, microbiology, ancillary and laboratory) are listed below for reference.     Microbiology: Recent Results (from the past 240 hour(s))  Culture, blood (Routine x 2)     Status: None (Preliminary result)   Collection Time: 01/21/19 12:38 PM  Result Value Ref Range Status   Specimen Description   Final    BLOOD LEFT FATTY CASTS Performed at [facility], [address], [place], [place] [address]    Special Requests   Final    BOTTLES DRAWN AEROBIC AND ANAEROBIC Blood Culture adequate volume Performed at [facility], [address] [place], [place] [address]    Culture   Final    NO GROWTH 3 DAYS Performed at [facility], [address], [place], [place] [address]    Report Status PENDING  Incomplete  Culture, blood (Routine x 2)     Status: None (Preliminary result)   Collection Time: 01/21/19 12:43 PM  Result Value Ref Range Status   Specimen Description   Final    BLOOD RIGHT FATTY CASTS Performed at [facility], [address], [place], [place] [address]    Special Requests   Final    BOTTLES DRAWN AEROBIC AND ANAEROBIC Blood Culture adequate volume Performed at  [facility], [address], [place], [place] [address]    Culture   Final    NO GROWTH 3 DAYS Performed at [facility], [address] [place], [place] [address]    Report Status PENDING  Incomplete  MRSA PCR Screening     Status: None   Collection Time: 01/21/19  5:43 PM  Result Value Ref Range Status   MRSA by PCR NEGATIVE NEGATIVE Final    Comment:        The GeneXpert MRSA Assay (FDA approved for NASAL specimens only), is one component of a comprehensive MRSA colonization surveillance program. It is not intended to diagnose MRSA infection nor to guide or monitor treatment for MRSA infections. Performed at [facility], [address] [place], [place]  [address]      Labs: BNP (last 3 results) No results for input(s): BNP in the last 8760 hours. Basic Metabolic Panel: Recent Labs  Lab 01/21/19 1244 01/22/19 0353  NA 137 141  K 3.1* 3.9  CL 106 112*  CO2 20* 23  GLUCOSE 173* 143*  BUN 8 9  CREATININE 0.92 0.91  CALCIUM 9.1 8.5*   Liver Function Tests: Recent Labs  Lab 01/21/19 1244 01/22/19 0353  AST 17 19  ALT 13 15  ALKPHOS 48 39  BILITOT 0.6 0.5  PROT 7.7 6.8  ALBUMIN 4.6 4.2   No results for input(s): LIPASE, AMYLASE in the last 168 hours. No results for input(s): AMMONIA in the last 168 hours. CBC: Recent Labs  Lab 01/21/19 1244 01/22/19 0353  WBC 8.3 6.9  NEUTROABS 6.0  --   HGB 14.8 12.9*  HCT 44.3 39.4  MCV 94.5 95.6  PLT 238 222   Cardiac Enzymes: No results for input(s): CKTOTAL, CKMB, CKMBINDEX, TROPONINI in the last 168 hours. BNP: Invalid input(s): POCBNP CBG: No results for input(s): GLUCAP in the last 168 hours. D-Dimer No results for input(s): DDIMER in the last 72 hours. Hgb A1c No results for input(s): HGBA1C in the last 72 hours. Lipid Profile No results for input(s): CHOL, HDL, LDLCALC, TRIG, CHOLHDL, LDLDIRECT in the last 72 hours. Thyroid function studies No results  for input(s): TSH, T4TOTAL, T3FREE, THYROIDAB in the last 72 hours.  Invalid input(s): FREET3 Anemia work up No results for input(s): VITAMINB12, FOLATE, FERRITIN, TIBC, IRON, RETICCTPCT in the last 72 hours. Urinalysis    Component Value Date/Time   COLORURINE STRAW (A) 01/21/2019 1244   APPEARANCEUR CLEAR 01/21/2019 1244   LABSPEC 1.008 01/21/2019 1244   PHURINE 6.0 01/21/2019 1244   GLUCOSEU 50 (A) 01/21/2019 1244   HGBUR NEGATIVE 01/21/2019 1244   BILIRUBINUR NEGATIVE 01/21/2019 1244   KETONESUR 5 (A) 01/21/2019 1244   PROTEINUR NEGATIVE 01/21/2019 1244   UROBILINOGEN 0.2 07/28/2014 1116   NITRITE NEGATIVE 01/21/2019 1244   LEUKOCYTESUR NEGATIVE 01/21/2019 1244   Sepsis Labs Invalid input(s): PROCALCITONIN,  WBC,  LACTICIDVEN Microbiology Recent Results (from the past 240 hour(s))  Culture, blood (Routine x 2)     Status: None (Preliminary result)   Collection Time: 01/21/19 12:38 PM  Result Value Ref Range Status   Specimen Description   Final    BLOOD LEFT FATTY CASTS Performed at [facility], [address] [place], [place] [address]    Special Requests   Final    BOTTLES DRAWN AEROBIC AND ANAEROBIC Blood Culture adequate volume Performed at [facility], [address] [place], [place] [address]    Culture   Final    NO GROWTH 3 DAYS Performed at [facility], [address] [place], [place] [address]    Report Status PENDING  Incomplete  Culture, blood (Routine x 2)     Status: None (Preliminary result)   Collection Time: 01/21/19 12:43 PM  Result Value Ref Range Status   Specimen Description   Final    BLOOD RIGHT FATTY CASTS Performed at [facility], [address] [place], [place] [address]    Special Requests   Final    BOTTLES DRAWN AEROBIC AND ANAEROBIC Blood Culture adequate volume Performed at [facility], [address] [place], [place] [address]    Culture    Final    NO GROWTH 3 DAYS Performed at [facility], [address] [place], [place] [address]    Report Status PENDING  Incomplete  MRSA PCR Screening     Status: None   Collection Time: 01/21/19  5:43 PM  Result Value Ref Range Status   MRSA by PCR NEGATIVE NEGATIVE Final    Comment:        The GeneXpert MRSA Assay (FDA approved for NASAL specimens only), is one component of a comprehensive MRSA colonization surveillance program. It is not intended to diagnose MRSA infection nor to guide or monitor treatment for MRSA infections. Performed at [facility], [address], [place], [place] [address]      Time coordinating discharge: 34 minutes  SIGNED:   Kathlen Mody, MD  [facility] 01/25/2019, 12:22 AM Pager   If 7PM-7AM, please contact night-coverage [email or web address] Password [email or web address]

## 2019-01-26 LAB — CULTURE, BLOOD (ROUTINE X 2)
CULTURE: NO GROWTH
Culture: NO GROWTH
SPECIAL REQUESTS: ADEQUATE
SPECIAL REQUESTS: ADEQUATE

## 2020-03-01 ENCOUNTER — Ambulatory Visit: Payer: Self-pay | Attending: Internal Medicine

## 2020-03-01 DIAGNOSIS — Z23 Encounter for immunization: Secondary | ICD-10-CM

## 2020-03-01 NOTE — Progress Notes (Signed)
   Covid-19 Vaccination Clinic  Name:  Wayne Hawkins    MRN: [id number] DOB: 04-Dec-1970  03/01/2020  Wayne Hawkins was observed post Covid-19 immunization for 15 minutes without incident. He was provided with Vaccine Information Sheet and instruction to access the V-Safe system.   Wayne Hawkins was instructed to call 911 with any severe reactions post vaccine: Marland Kitchen Difficulty breathing  . Swelling of face and throat  . A fast heartbeat  . A bad rash all over body  . Dizziness and weakness   Immunizations Administered    Name Date Dose VIS Date Route   Pfizer COVID-19 Vaccine 03/01/2020 12:04 PM 0.3 mL 11/03/2019 Intramuscular   Manufacturer: [employer], [employer]   Lot: [id number]   NDC: [address]-8

## 2020-03-25 ENCOUNTER — Ambulatory Visit: Payer: Self-pay | Attending: Internal Medicine

## 2020-03-25 DIAGNOSIS — Z23 Encounter for immunization: Secondary | ICD-10-CM

## 2020-03-25 NOTE — Progress Notes (Signed)
   Covid-19 Vaccination Clinic  Name:  Wayne Hawkins    MRN: [id number] DOB: [birth year]  03/25/2020  [name] was observed post Covid-19 immunization for 15 minutes without incident. He was provided with Vaccine Information Sheet and instruction to access the V-Safe system.   [name] was instructed to call 911 with any severe reactions post vaccine: Marland Kitchen Difficulty breathing  . Swelling of face and throat  . A fast heartbeat  . A bad rash all over body  . Dizziness and weakness   Immunizations Administered    Name Date Dose VIS Date Route   Pfizer COVID-19 Vaccine 03/25/2020 11:20 AM 0.3 mL 01/17/2019 Intramuscular   Manufacturer: [employer], [employer]   Lot: [id number]   NDC: [address]-3

## 2020-07-18 ENCOUNTER — Encounter (HOSPITAL_COMMUNITY): Payer: Self-pay | Admitting: Emergency Medicine

## 2020-07-18 ENCOUNTER — Emergency Department (HOSPITAL_COMMUNITY): Payer: Self-pay

## 2020-07-18 ENCOUNTER — Other Ambulatory Visit: Payer: Self-pay

## 2020-07-18 ENCOUNTER — Emergency Department (HOSPITAL_COMMUNITY)
Admission: EM | Admit: 2020-07-18 | Discharge: 2020-07-18 | Disposition: A | Discharge status: Home / Self Care | Payer: Self-pay | Attending: Emergency Medicine | Admitting: Emergency Medicine

## 2020-07-18 DIAGNOSIS — R569 Unspecified convulsions: Secondary | ICD-10-CM | POA: Insufficient documentation

## 2020-07-18 DIAGNOSIS — R519 Headache, unspecified: Secondary | ICD-10-CM | POA: Insufficient documentation

## 2020-07-18 DIAGNOSIS — Z87891 Personal history of nicotine dependence: Secondary | ICD-10-CM | POA: Insufficient documentation

## 2020-07-18 DIAGNOSIS — H539 Unspecified visual disturbance: Secondary | ICD-10-CM | POA: Insufficient documentation

## 2020-07-18 DIAGNOSIS — Z79899 Other long term (current) drug therapy: Secondary | ICD-10-CM | POA: Insufficient documentation

## 2020-07-18 DIAGNOSIS — J449 Chronic obstructive pulmonary disease, unspecified: Secondary | ICD-10-CM | POA: Insufficient documentation

## 2020-07-18 LAB — URINALYSIS, ROUTINE W REFLEX MICROSCOPIC
Bacteria, UA: NONE SEEN
Bilirubin Urine: NEGATIVE
Glucose, UA: NEGATIVE mg/dL
Ketones, ur: NEGATIVE mg/dL
Leukocytes,Ua: NEGATIVE
Nitrite: NEGATIVE
Protein, ur: 30 mg/dL — AB
Specific Gravity, Urine: 1.015 (ref 1.005–1.030)
pH: 6 (ref 5.0–8.0)

## 2020-07-18 LAB — COMPREHENSIVE METABOLIC PANEL
ALT: 15 U/L (ref 0–44)
AST: 15 U/L (ref 15–41)
Albumin: 4.5 g/dL (ref 3.5–5.0)
Alkaline Phosphatase: 47 U/L (ref 38–126)
Anion gap: 12 (ref 5–15)
BUN: 11 mg/dL (ref 6–20)
CO2: 25 mmol/L (ref 22–32)
Calcium: 8.8 mg/dL — ABNORMAL LOW (ref 8.9–10.3)
Chloride: 104 mmol/L (ref 98–111)
Creatinine, Ser: 1 mg/dL (ref 0.61–1.24)
GFR calc Af Amer: >60 mL/min (ref >60)
GFR calc non Af Amer: >60 mL/min (ref >60)
Glucose, Bld: 110 mg/dL — ABNORMAL HIGH (ref 70–99)
Potassium: 4.2 mmol/L (ref 3.5–5.1)
Sodium: 141 mmol/L (ref 135–145)
Total Bilirubin: 0.7 mg/dL (ref 0.3–1.2)
Total Protein: 7.3 g/dL (ref 6.5–8.1)

## 2020-07-18 LAB — CBC WITH DIFFERENTIAL/PLATELET
Abs Immature Granulocytes: 0.08 10*3/uL — ABNORMAL HIGH (ref 0.00–0.07)
Basophils Absolute: 0 10*3/uL (ref 0.0–0.1)
Basophils Relative: 0 %
Eosinophils Absolute: 0.3 10*3/uL (ref 0.0–0.5)
Eosinophils Relative: 2 %
HCT: 47.2 % (ref 39.0–52.0)
Hemoglobin: 16.2 g/dL (ref 13.0–17.0)
Immature Granulocytes: 1 %
Lymphocytes Relative: 21 %
Lymphs Abs: 3.3 10*3/uL (ref 0.7–4.0)
MCH: 32.4 pg (ref 26.0–34.0)
MCHC: 34.3 g/dL (ref 30.0–36.0)
MCV: 94.4 fL (ref 80.0–100.0)
Monocytes Absolute: 1.6 10*3/uL — ABNORMAL HIGH (ref 0.1–1.0)
Monocytes Relative: 10 %
Neutro Abs: 10.5 10*3/uL — ABNORMAL HIGH (ref 1.7–7.7)
Neutrophils Relative %: 66 %
Platelets: 300 10*3/uL (ref 150–400)
RBC: 5 MIL/uL (ref 4.22–5.81)
RDW: 13.9 % (ref 11.5–15.5)
WBC: 15.8 10*3/uL — ABNORMAL HIGH (ref 4.0–10.5)
nRBC: 0 % (ref 0.0–0.2)

## 2020-07-18 LAB — PROTIME-INR
INR: 1 (ref 0.8–1.2)
Prothrombin Time: 12.3 seconds (ref 11.4–15.2)

## 2020-07-18 MED ORDER — ACETAMINOPHEN 325 MG PO TABS
650.0000 mg | ORAL_TABLET | Freq: Once | ORAL | Status: AC
  Administered 2020-07-18: 650 mg via ORAL
  Filled 2020-07-18: qty 2

## 2020-07-18 MED ORDER — LEVETIRACETAM 500 MG PO TABS: 500.0000 mg | ORAL_TABLET | Freq: Two times a day (BID) | ORAL | 0 refills | Status: DC

## 2020-07-18 MED ORDER — SODIUM CHLORIDE 0.9 % IV BOLUS
1000.0000 mL | Freq: Once | INTRAVENOUS | Status: AC
  Administered 2020-07-18: 1000 mL via INTRAVENOUS

## 2020-07-18 MED ORDER — LEVETIRACETAM IN NACL 1000 MG/100ML IV SOLN
1000.0000 mg | Freq: Once | INTRAVENOUS | Status: AC
  Administered 2020-07-18: 1000 mg via INTRAVENOUS
  Filled 2020-07-18: qty 100

## 2020-07-18 NOTE — ED Notes (Signed)
Pt provided gingerale and crackers for PO challenge 

## 2020-07-18 NOTE — ED Notes (Signed)
Pt transported to CT ?

## 2020-07-18 NOTE — Discharge Instructions (Signed)
Your history and exam today was consistent with a breakthrough seizure after you have been off of your seizure medication and did not sleep well the night before.  Your work-up was otherwise reassuring with no evidence of acute injuries.  Please start your seizure medications again and we loaded you today with Keppra.  Please rest and stay hydrated.  Please follow-up with your neurology team for further prescriptions and management.  Please rest.  If any symptoms change or worsen, return to the nearest emergency department.

## 2020-07-18 NOTE — ED Provider Notes (Signed)
[facility]-EMERGENCY DEPT Provider Note   CSN: [id number] Arrival date & time: 07/18/20  4098     History Chief Complaint  Patient presents with  . Seizures    Wayne Hawkins is a 49 y.o. male.  The history is provided by the patient and medical records. No language interpreter was used.  Seizures Seizure activity on arrival: no   Seizure type:  Grand mal Initial focality:  None Return to baseline: yes   Severity:  Moderate Duration:  20 minutes (per roommate to EMS) Timing:  Clustered Number of seizures this episode:  2 Progression:  Resolved Context: decreased sleep (stayed up late lastnight), drug use and medical non-compliance   Context: not alcohol withdrawal, not cerebral palsy, not developmental delay, not fever and not previous head injury   Recent head injury:  No recent head injuries PTA treatment:  None History of seizures: yes        Past Medical History:  Diagnosis Date  . Asthma   . COPD (chronic obstructive pulmonary disease) (HCC)   . Seizures [facility])     Patient Active Problem List   Diagnosis Date Noted  . COPD (chronic obstructive pulmonary disease) with acute bronchitis (HCC) 01/21/2019  . Influenza A 01/21/2019  . Marijuana dependence (HCC) 07/29/2014  . Severe pain of left shoulder 07/29/2014  . Bilateral groin pain 07/29/2014  . Anxiety state, unspecified 07/29/2014  . Observed seizure-like activity (HCC) 07/28/2014    No past surgical history on file.     Family History  Problem Relation Age of Onset  . Hypertension Mother   . Hypertension Father     Social History   Tobacco Use  . Smoking status: Former Smoker    Packs/day: 1.00    Years: 16.00    Pack years: 16.00    Types: Cigarettes    Quit date: 11/20/2016    Years since quitting: 3.6  . Smokeless tobacco: Never Used  . Tobacco comment: quit 2 yrs ago   Vaping Use  . Vaping Use: Former  . Quit date: 05/20/2018  . Substances: Nicotine, Mixture  of cannabinoids  Substance Use Topics  . Alcohol use: No  . Drug use: Not Currently    Types: Marijuana    Comment: quit 2 yrs ago     Home Medications Prior to Admission medications   Medication Sig Start Date End Date Taking? Authorizing Provider  albuterol (PROVENTIL HFA;VENTOLIN HFA) 108 (90 Base) MCG/ACT inhaler Inhale 2 puffs into the lungs every 6 (six) hours as needed for wheezing or shortness of breath.  12/25/18   [provider]  albuterol (PROVENTIL) (2.5 MG/3ML) 0.083% nebulizer solution Take 2.5 mg by nebulization every 6 (six) hours as needed for wheezing or shortness of breath.  09/07/18   [provider]  diphenhydrAMINE (BENADRYL) 25 MG tablet Take 25 mg by mouth every 6 (six) hours as needed for allergies.    [provider]  guaiFENesin (MUCINEX) 600 MG 12 hr tablet Take 1 tablet (600 mg total) by mouth 2 (two) times daily. 01/24/19   Kathlen Mody, MD  ipratropium-albuterol (DUONEB) 0.5-2.5 (3) MG/3ML SOLN Take 3 mLs by nebulization 3 (three) times daily. 01/24/19   Kathlen Mody, MD  predniSONE (DELTASONE) 20 MG tablet Prednisone 40 mg daily for 3 days followed by  Prednisone 20 mg daily for 3 days and stop. 01/24/19   Kathlen Mody, MD    Allergies    Patient has no known allergies.  Review of Systems  Review of Systems  Constitutional: Negative for chills, diaphoresis, fatigue and fever.  Eyes: Positive for visual disturbance (resolved blurry vision).  Respiratory: Negative for cough, chest tightness, shortness of breath and wheezing.   Cardiovascular: Negative for chest pain, palpitations and leg swelling.  Gastrointestinal: Negative for abdominal pain, constipation, diarrhea, nausea and vomiting.  Genitourinary: Positive for frequency. Negative for dysuria and flank pain.  Musculoskeletal: Negative for back pain, neck pain and neck stiffness.  Skin: Negative for rash and wound.  Neurological: Positive for seizures and headaches. Negative  for light-headedness.  Psychiatric/Behavioral: Negative for agitation and confusion.  All other systems reviewed and are negative.   Physical Exam Updated Vital Signs There were no vitals taken for this visit.  Physical Exam Vitals and nursing note reviewed.  Constitutional:      General: He is not in acute distress.    Appearance: He is well-developed. He is not ill-appearing, toxic-appearing or diaphoretic.  HENT:     Head: Abrasion and contusion present. No laceration.      Nose: Nose normal. No congestion or rhinorrhea.     Mouth/Throat:     Mouth: Mucous membranes are moist.     Pharynx: No oropharyngeal exudate or posterior oropharyngeal erythema.  Eyes:     Conjunctiva/sclera: Conjunctivae normal.     Pupils: Pupils are equal, round, and reactive to light.  Cardiovascular:     Rate and Rhythm: Normal rate and regular rhythm.     Pulses: Normal pulses.     Heart sounds: No murmur heard.   Pulmonary:     Effort: Pulmonary effort is normal. No respiratory distress.     Breath sounds: Normal breath sounds. No wheezing or rhonchi.  Chest:     Chest wall: No tenderness.  Abdominal:     General: Abdomen is flat.     Palpations: Abdomen is soft.     Tenderness: There is no abdominal tenderness. There is no right CVA tenderness, left CVA tenderness, guarding or rebound.  Musculoskeletal:        General: Tenderness (l elbow) and signs of injury present.       Arms:     Cervical back: Neck supple. Tenderness present.     Thoracic back: No tenderness.     Lumbar back: Spasms and tenderness present. No deformity or lacerations.       Back:     Right lower leg: No edema.     Left lower leg: No edema.     Comments: Tenderness and bruising of the left posterior elbow.  Good grip and compensation, and pulses.  Skin:    General: Skin is warm and dry.     Capillary Refill: Capillary refill takes less than 2 seconds.     Coloration: Skin is not pale.     Findings: No erythema  or rash.  Neurological:     General: No focal deficit present.     Mental Status: He is alert.     GCS: GCS eye subscore is 4. GCS verbal subscore is 5. GCS motor subscore is 6.     Cranial Nerves: No cranial nerve deficit, dysarthria or facial asymmetry.     Sensory: No sensory deficit.     Motor: No weakness or abnormal muscle tone.     Coordination: Coordination normal. Finger-Nose-Finger Test normal.     Comments: No focal neurologic deficit on initial exam.  Normal sensation and strength in all extremities.  Normal finger-nose-finger testing.  Symmetric smile.  Pupils medically  reactive normal extraocular movements.  Clear speech.  No reported persistent visual changes.  Psychiatric:        Mood and Affect: Mood normal.     ED Results / Procedures / Treatments   Labs (all labs ordered are listed, but only abnormal results are displayed) Labs Reviewed  CBC WITH DIFFERENTIAL/PLATELET - Abnormal; Notable for the following components:      Result Value   WBC 15.8 (*)    Neutro Abs 10.5 (*)    Monocytes Absolute 1.6 (*)    Abs Immature Granulocytes 0.08 (*)    All other components within normal limits  COMPREHENSIVE METABOLIC PANEL - Abnormal; Notable for the following components:   Glucose, Bld 110 (*)    Calcium 8.8 (*)    All other components within normal limits  URINALYSIS, ROUTINE W REFLEX MICROSCOPIC - Abnormal; Notable for the following components:   Color, Urine STRAW (*)    Hgb urine dipstick SMALL (*)    Protein, ur 30 (*)    All other components within normal limits  URINE CULTURE  PROTIME-INR    EKG None  Radiology DG Chest 2 View  Result Date: 07/18/2020 CLINICAL DATA:  Seizure EXAM: CHEST - 2 VIEW COMPARISON:  January 21, 2019 FINDINGS: The lungs are clear. The heart size and pulmonary vascularity are normal. No adenopathy. No bone lesions. IMPRESSION: Lungs clear.  Cardiac silhouette normal.  No evident adenopathy. Electronically Signed   By: Bretta BangWilliam   Woodruff III M.D.   On: 07/18/2020 11:25   DG Lumbar Spine Complete  Result Date: 07/18/2020 CLINICAL DATA:  Recent seizure EXAM: LUMBAR SPINE - COMPLETE 4+ VIEW COMPARISON:  None. FINDINGS: Frontal, lateral, spot lumbosacral lateral, and bilateral oblique views were obtained. There are 5 non-rib-bearing lumbar type vertebral bodies. There is slight lumbar dextroscoliosis. There is no fracture or spondylolisthesis. Disc spaces appear unremarkable. There is mild facet osteoarthritic change on the right at L5-S1. Facets at other levels appear normal. No erosive change or bony destruction. 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 No appreciable disc space narrowing. No erosive change or bony destruction. No fracture or spondylolisthesis. Electronically Signed   By: Bretta BangWilliam  Woodruff III M.D.   On: 07/18/2020 12:52   DG Elbow Complete Left  Result Date: 07/18/2020 CLINICAL DATA:  Pain following fall EXAM: LEFT ELBOW - COMPLETE 3+ VIEW COMPARISON:  None. FINDINGS: Frontal, lateral, and bilateral oblique views were obtained. No fracture or dislocation. No joint effusion. Joint spaces appear normal. No erosive change. IMPRESSION: No fracture or dislocation.  No appreciable arthropathy. Electronically Signed   By: Bretta BangWilliam  Woodruff III M.D.   On: 07/18/2020 11:25   CT Head Wo Contrast  Result Date: 07/18/2020 CLINICAL DATA:  Seizure, headache, right-sided neck pain EXAM: CT HEAD WITHOUT CONTRAST CT CERVICAL SPINE WITHOUT CONTRAST TECHNIQUE: Multidetector CT imaging of the head and cervical spine was performed following the standard protocol without intravenous contrast. Multiplanar CT image reconstructions of the cervical spine were also generated. COMPARISON:  08/16/2015 FINDINGS: CT HEAD FINDINGS Brain: No evidence of acute infarction, hemorrhage, hydrocephalus, extra-axial collection or mass lesion/mass effect. Vascular: No hyperdense vessel or unexpected calcification. Skull: Normal. Negative  for fracture or focal lesion. Sinuses/Orbits: Chronic complete opacification of the right maxillary sinus. Chronic mucosal thickening within the bilateral ethmoid air cells. Orbital structures unremarkable. Other: None. CT CERVICAL SPINE FINDINGS Alignment: Facet joints are aligned without dislocation. Preserved cervical lordosis. Dens and lateral masses are aligned. Skull base and vertebrae: No acute fracture.  No primary bone lesion or focal pathologic process. Soft tissues and spinal canal: No prevertebral fluid or swelling. No visible canal hematoma. Disc levels:  Unremarkable. Upper chest: Visualized lung apices are clear. Mild biapical pleuroparenchymal scarring. Other: None. IMPRESSION: 1. No acute intracranial findings. 2. No acute fracture or subluxation of the cervical spine. 3. 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 Electronically Signed   By: Duanne Guess D.O.   On: 07/18/2020 11:36   CT Cervical Spine Wo Contrast  Result Date: 07/18/2020 CLINICAL DATA:  Seizure, headache, right-sided neck pain EXAM: CT HEAD WITHOUT CONTRAST CT CERVICAL SPINE WITHOUT CONTRAST TECHNIQUE: Multidetector CT imaging of the head and cervical spine was performed following the standard protocol without intravenous contrast. Multiplanar CT image reconstructions of the cervical spine were also generated. COMPARISON:  08/16/2015 FINDINGS: CT HEAD FINDINGS Brain: No evidence of acute infarction, hemorrhage, hydrocephalus, extra-axial collection or mass lesion/mass effect. Vascular: No hyperdense vessel or unexpected calcification. Skull: Normal. Negative for fracture or focal lesion. Sinuses/Orbits: Chronic complete opacification of the right maxillary sinus. Chronic mucosal thickening within the bilateral ethmoid air cells. Orbital structures unremarkable. Other: None. CT CERVICAL SPINE FINDINGS Alignment: Facet joints are aligned without dislocation. Preserved cervical lordosis. Dens and lateral masses  are aligned. Skull base and vertebrae: No acute fracture. No primary bone lesion or focal pathologic process. Soft tissues and spinal canal: No prevertebral fluid or swelling. No visible canal hematoma. Disc levels:  Unremarkable. Upper chest: Visualized lung apices are clear. Mild biapical pleuroparenchymal scarring. Other: None. IMPRESSION: 1. No acute intracranial findings. 2. No acute fracture or subluxation of the cervical spine. 3. 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 Electronically Signed   By: Duanne Guess D.O.   On: 07/18/2020 11:36    Procedures Procedures (including critical care time)  Medications Ordered in ED Medications  levETIRAcetam (KEPPRA) IVPB 1000 mg/100 mL premix (0 mg Intravenous Stopped 07/18/20 1328)  sodium chloride 0.9 % bolus 1,000 mL (0 mLs Intravenous Stopped 07/18/20 1328)  acetaminophen (TYLENOL) tablet 650 mg (650 mg Oral Given 07/18/20 1344)    ED Course  I have reviewed the triage vital signs and the nursing notes.  Pertinent labs & imaging results that were available during my care of the patient were reviewed by me and considered in my medical decision making (see chart for details).    MDM Rules/Calculators/A&P                          Wayne Hawkins is a 49 y.o. male with a past medical history significant for COPD, asthma, and recurrent seizures who presents with a seizure. According to patient, his roommate saw him had 2 seizures this morning lasting approximately 20 minutes. Patient rolled off of his air mattress onto the ground and sustained an abrasion to the right forehead and was complaining of some headache, neck pain, and lumbar back pain. He also complained of some left elbow pain. He reports that her left several days he has had increase in urination but denies any dysuria or hematuria. He reports that for the last month he has had some blurry vision at times. He denies taking any Keppra as he reports it "made him feel  weird". And he has not taken it in some time. He reports his last seizure was around 1 year ago. He denies other complaints on arrival and denies any alcohol use. He denies any new drug use other than marijuana which  he reports he has smoked for many years. He denies other complaints on arrival.  On exam, lungs are clear and chest is nontender. Abdomen is nontender. Patient moving all extremities normal sensation, strength, and pulse in all extremities. Left elbow is tender to palpation with some bruising on the posterior elbow. Patient has some tenderness on his bilateral paraspinal neck as well as his occiput. He also has a small, 1 cm abrasion to his right forehead that is hemostatic. Patient is some tenderness across his low back. Exam otherwise unremarkable.  Patient with imaging of his head and neck as well as an x-ray of his lumbar spine. We will get a chest x-ray, labs, urinalysis to look for occult infection. Will load him with Keppra as he was last time. If work-up is reassuring, dissipate discharge with instructions to restart home seizure medication and follow-up with PCP and neurology team.  Anticipate reassessment after work-up.  Patient's work-up overall reassuring.  Allises does not show UTI.  Mild leukocytosis likely demargination from seizure.  Metabolic panel reassuring similar to prior.  INR normal.  CT head and neck showed no acute injuries and x-rays lumbar spine also show degenerative changes but no acute injuries.  Elbow x-ray showed no fracture.  Suspect soft tissue and muscular injuries.  Abrasion on his head is hemostatic.  Patient was given Keppra and he is feeling better after some fluids.  Suspect he was slightly dehydrated.  Patient does want to start his oral Keppra again and will follow up with outpatient neurology and his PCP.  He agreed with plan of care and was discharged in good condition after over 5 hours of observation with no further seizure-like activity and well  appearance.   Final Clinical Impression(s) / ED Diagnoses Final diagnoses:  Seizure (HCC)    Rx / DC Orders ED Discharge Orders         Ordered    levETIRAcetam (KEPPRA) 500 MG tablet  2 times daily        07/18/20 1454         Clinical Impression: 1. Seizure [facility])     Disposition: Discharge  Condition: Good  I have discussed the results, Dx and Tx plan with the pt(& family if present). He/she/they expressed understanding and agree(s) with the plan. Discharge instructions discussed at great length. Strict return precautions discussed and pt &/or family have verbalized understanding of the instructions. No further questions at time of discharge.    New Prescriptions   LEVETIRACETAM (KEPPRA) 500 MG TABLET    Take 1 tablet (500 mg total) by mouth 2 (two) times daily.    Follow Up: [facility] [address] [place] [address] [phone]    [facility] [address] [id number] mc [address] [place] [address] [phone]       Avion Kutzer, Canary Brim, MD 07/18/20 1500

## 2020-07-18 NOTE — ED Triage Notes (Signed)
BIBA Per EMS: Pt coming from home  Per roommate pt had 2 seizures this morning  EMS reports pt fell approx 8in from air mattress to floor.   C/O headache and tenderness to R side of neck Abrasion to R scalp  Hematoma on L lumbar area  2L [place]  Vitals  140/82 80 HR 18 RR 95% 90 CBG  18G L AC

## 2020-07-18 NOTE — ED Notes (Signed)
Discharge paperwork reviewed with pt.  Pt with no questions or concerns at time of discharge, pt wheeled to ED entrance to meet ride.

## 2020-07-19 LAB — URINE CULTURE: Culture: <10000 — AB

## 2022-02-26 ENCOUNTER — Other Ambulatory Visit: Payer: Self-pay

## 2022-02-26 ENCOUNTER — Other Ambulatory Visit (HOSPITAL_COMMUNITY): Payer: Self-pay

## 2022-02-26 ENCOUNTER — Emergency Department (HOSPITAL_COMMUNITY): Payer: Self-pay

## 2022-02-26 ENCOUNTER — Encounter (HOSPITAL_COMMUNITY): Payer: Self-pay

## 2022-02-26 ENCOUNTER — Emergency Department (HOSPITAL_COMMUNITY)
Admission: EM | Admit: 2022-02-26 | Discharge: 2022-02-26 | Disposition: A | Discharge status: Home / Self Care | Payer: Self-pay | Attending: Emergency Medicine | Admitting: Emergency Medicine

## 2022-02-26 DIAGNOSIS — M549 Dorsalgia, unspecified: Secondary | ICD-10-CM | POA: Insufficient documentation

## 2022-02-26 DIAGNOSIS — M25522 Pain in left elbow: Secondary | ICD-10-CM | POA: Insufficient documentation

## 2022-02-26 DIAGNOSIS — R569 Unspecified convulsions: Secondary | ICD-10-CM | POA: Insufficient documentation

## 2022-02-26 DIAGNOSIS — M25512 Pain in left shoulder: Secondary | ICD-10-CM | POA: Insufficient documentation

## 2022-02-26 DIAGNOSIS — R519 Headache, unspecified: Secondary | ICD-10-CM | POA: Insufficient documentation

## 2022-02-26 DIAGNOSIS — S01112A Laceration without foreign body of left eyelid and periocular area, initial encounter: Secondary | ICD-10-CM | POA: Insufficient documentation

## 2022-02-26 DIAGNOSIS — J449 Chronic obstructive pulmonary disease, unspecified: Secondary | ICD-10-CM | POA: Insufficient documentation

## 2022-02-26 DIAGNOSIS — R300 Dysuria: Secondary | ICD-10-CM | POA: Insufficient documentation

## 2022-02-26 DIAGNOSIS — R112 Nausea with vomiting, unspecified: Secondary | ICD-10-CM | POA: Insufficient documentation

## 2022-02-26 DIAGNOSIS — X58XXXA Exposure to other specified factors, initial encounter: Secondary | ICD-10-CM | POA: Insufficient documentation

## 2022-02-26 DIAGNOSIS — D72829 Elevated white blood cell count, unspecified: Secondary | ICD-10-CM | POA: Insufficient documentation

## 2022-02-26 LAB — BASIC METABOLIC PANEL
Anion gap: 12 (ref 5–15)
BUN: 9 mg/dL (ref 6–20)
CO2: 20 mmol/L — ABNORMAL LOW (ref 22–32)
Calcium: 9.9 mg/dL (ref 8.9–10.3)
Chloride: 106 mmol/L (ref 98–111)
Creatinine, Ser: 0.99 mg/dL (ref 0.61–1.24)
GFR, Estimated: >60 mL/min (ref >60)
Glucose, Bld: 109 mg/dL — ABNORMAL HIGH (ref 70–99)
Potassium: 3.8 mmol/L (ref 3.5–5.1)
Sodium: 138 mmol/L (ref 135–145)

## 2022-02-26 LAB — CBC
HCT: 45.9 % (ref 39.0–52.0)
Hemoglobin: 16.1 g/dL (ref 13.0–17.0)
MCH: 32.1 pg (ref 26.0–34.0)
MCHC: 35.1 g/dL (ref 30.0–36.0)
MCV: 91.4 fL (ref 80.0–100.0)
Platelets: 332 10*3/uL (ref 150–400)
RBC: 5.02 MIL/uL (ref 4.22–5.81)
RDW: 13.2 % (ref 11.5–15.5)
WBC: 19.4 10*3/uL — ABNORMAL HIGH (ref 4.0–10.5)
nRBC: 0 % (ref 0.0–0.2)

## 2022-02-26 LAB — URINALYSIS, ROUTINE W REFLEX MICROSCOPIC
Bilirubin Urine: NEGATIVE
Glucose, UA: NEGATIVE mg/dL
Hgb urine dipstick: NEGATIVE
Ketones, ur: 80 mg/dL — AB
Leukocytes,Ua: NEGATIVE
Nitrite: NEGATIVE
Protein, ur: NEGATIVE mg/dL
Specific Gravity, Urine: 1.015 (ref 1.005–1.030)
pH: 5 (ref 5.0–8.0)

## 2022-02-26 LAB — CBG MONITORING, ED: Glucose-Capillary: 117 mg/dL — ABNORMAL HIGH (ref 70–99)

## 2022-02-26 MED ORDER — LEVETIRACETAM 500 MG PO TABS
500.0000 mg | ORAL_TABLET | Freq: Two times a day (BID) | ORAL | 0 refills | Status: DC
  Filled 2022-02-26: qty 60, 30d supply, fill #0

## 2022-02-26 MED ORDER — ALUM & MAG HYDROXIDE-SIMETH 200-200-20 MG/5ML PO SUSP
15.0000 mL | Freq: Once | ORAL | Status: AC
Start: 2022-02-26 — End: 2022-02-26
  Administered 2022-02-26: 15 mL via ORAL
  Filled 2022-02-26: qty 30

## 2022-02-26 MED ORDER — MORPHINE SULFATE (PF) 4 MG/ML IV SOLN
4.0000 mg | Freq: Once | INTRAVENOUS | Status: AC
  Administered 2022-02-26: 4 mg via INTRAVENOUS
  Filled 2022-02-26: qty 1

## 2022-02-26 MED ORDER — ONDANSETRON HCL 4 MG/2ML IJ SOLN
4.0000 mg | Freq: Once | INTRAMUSCULAR | Status: AC
  Administered 2022-02-26: 4 mg via INTRAVENOUS
  Filled 2022-02-26: qty 2

## 2022-02-26 MED ORDER — LORAZEPAM 2 MG/ML IJ SOLN
1.0000 mg | Freq: Once | INTRAMUSCULAR | Status: AC
  Administered 2022-02-26: 1 mg via INTRAVENOUS
  Filled 2022-02-26: qty 1

## 2022-02-26 MED ORDER — LACTATED RINGERS IV BOLUS
1000.0000 mL | Freq: Once | INTRAVENOUS | Status: AC
  Administered 2022-02-26: 1000 mL via INTRAVENOUS

## 2022-02-26 MED ORDER — LEVETIRACETAM IN NACL 1500 MG/100ML IV SOLN
1500.0000 mg | Freq: Once | INTRAVENOUS | Status: AC
  Administered 2022-02-26: 1500 mg via INTRAVENOUS
  Filled 2022-02-26: qty 100

## 2022-02-26 NOTE — Progress Notes (Signed)
Transition of Care [facility]) - Emergency Department Mini Assessment ? ? ?Patient Details  ?Name: Wayne Hawkins ?MRN: [id number] ?Date of Birth: [birth year] ? ?Transition of Care (TOC) CM/SW Contact:    ?Oletta Cohn, RN ?Phone Number: ?02/26/2022, 3:42 PM ? ? ?Clinical Narrative: ?RNCM consulted regarding uninsured pt requiring seizure Rx. [facility]acy will deliver Rx to patient at bedside prior to discharge from hospital. ? ?RNCM set up appointment with [facility] on 4/27 [address]015.   ?Mallery Harshman J. Lucretia Roers, RN, BSN, [place] [phone] ? ?ED Mini Assessment: ?What brought you to the Emergency Department? : (P) Seizure ? ?Barriers to Discharge: (P) Barriers Resolved ? ?Barrier interventions: (P) TOC Pharmacy and PCP appointment ? ?Means of departure: (P) Car ? ?Interventions which prevented an admission or readmission: (P) Medication Review, Follow-up medical appointment ? ? ? ?Patient Contact and Communications ?  ?  ?  ? ,     ?  ?  ? ?Patient states their goals for this hospitalization and ongoing recovery are:: (P) Get back on seizure Rx and get a primary doctor ?  ?  ? ?Admission diagnosis:  SZ HAS FACIAL INJURY ?Patient Active Problem List  ? Diagnosis Date Noted  ? COPD (chronic obstructive pulmonary disease) with acute bronchitis (HCC) 01/21/2019  ? Influenza A 01/21/2019  ? Marijuana dependence (HCC) 07/29/2014  ? Severe pain of left shoulder 07/29/2014  ? Bilateral groin pain 07/29/2014  ? Anxiety state, unspecified 07/29/2014  ? Observed seizure-like activity (HCC) 07/28/2014  ? ?PCP:  Patient, No Pcp Per (Inactive) ?Pharmacy:   ?[facility] [address], [place] - [address] ?[address] ?[place] [address] ?Phone: [phone] Fax: [phone] ? ?[facility] [place], [place] - [address] ?[phone] ?[place] [address] ?Phone: [phone] Fax: [phone] ? ?[facility] ?[address] ?[place] [address] ?Phone: [phone] Fax: [phone] ?  ?

## 2022-02-26 NOTE — ED Provider Triage Note (Signed)
Emergency Medicine Provider Triage Evaluation Note ? ?Wayne Hawkins , a 51 y.o. male  was evaluated in triage.  Pt complains of seizure. Hx of same, states that last seizure was many years ago.  States that he is supposed to be on medication for same but does not take it and has not taken it in many years.  He cannot remember the name of the seizure medication.  States that he woke up this morning ' screaming for Wayne Hawkins' which is always what he does after he has a seizure.  He endorses associated headache, left eyebrow abrasion, pain in left shoulder, left elbow, and right hip.  No other associated symptoms. ? ?Review of Systems  ?Positive:  ?Negative: See above ? ?Physical Exam  ?BP 121/90 (BP Location: Left Arm)   Pulse 94   Temp 99.8 ?F (37.7 ?C) (Oral)   Resp 18   Ht 6' (1.829 m)   Wt 77.1 kg   SpO2 96%   BMI 23.06 kg/m?  ?Gen:   Awake, no distress   ?Resp:  Normal effort  ?MSK:   Tenderness to palpation of left shoulder, left elbow, and right hip.  Radial pulse 2+ ?Other:  Abrasion noted to the left eyebrow with surrounding dried blood.  Alert and oriented and neurologically intact moving all extremities. ? ?Medical Decision Making  ?Medically screening exam initiated at 11:23 AM.  Appropriate orders placed.  Other Wayne Hawkins was informed that the remainder of the evaluation will be completed by another provider, this initial triage assessment does not replace that evaluation, and the importance of remaining in the ED until their evaluation is complete. ? ? ?  ?Wayne Bandy, PA-C ?02/26/22 1127 ? ?

## 2022-02-26 NOTE — ED Provider Notes (Signed)
Blood pressure (!) 121/107, pulse 88, temperature 99.8 ?F (37.7 ?C), temperature source Oral, resp. rate 16, height 6' (1.829 m), weight 77.1 kg, SpO2 99 %. ? ?Assuming care from Dr. Anitra Lauth.  In short, Wayne Hawkins is a 51 y.o. male with a chief complaint of Seizures ?Marland Kitchen  Refer to the original H&P for additional details. ? ?The current plan of care is to f/u on UA and likely d/c. TOC consulted and seizure medication sent to pharmacy. ? ?UA without infection. Plan for d/c.  ? ?  ?Maia Plan, MD ?02/27/22 1143 ? ?

## 2022-02-26 NOTE — ED Notes (Signed)
Pt discharged home. Pt verbalized understanding of instructions.  ?

## 2022-02-26 NOTE — ED Triage Notes (Signed)
Pt reports he woke up this morning laying on the floor. He reports he must have had a seizures. He doesn't remember when his last seizure was or what the name of the medication he is supposed to be taking. He hasnt taken meds in over 2-3 years. He reports headache, nausea, vomiting and pain to right hip, left shoulder and both elbows. Has abrasion to left eyebrow. ?
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
## 2022-02-26 NOTE — Discharge Instructions (Addendum)

## 2022-03-01 ENCOUNTER — Other Ambulatory Visit: Payer: Self-pay

## 2022-03-01 ENCOUNTER — Encounter (HOSPITAL_COMMUNITY): Payer: Self-pay

## 2022-03-01 ENCOUNTER — Emergency Department (HOSPITAL_COMMUNITY)
Admission: EM | Admit: 2022-03-01 | Discharge: 2022-03-01 | Disposition: A | Discharge status: Home / Self Care | Payer: Self-pay | Attending: Emergency Medicine | Admitting: Emergency Medicine

## 2022-03-01 ENCOUNTER — Emergency Department (HOSPITAL_COMMUNITY): Payer: Self-pay

## 2022-03-01 DIAGNOSIS — M25551 Pain in right hip: Secondary | ICD-10-CM | POA: Insufficient documentation

## 2022-03-01 DIAGNOSIS — S40021A Contusion of right upper arm, initial encounter: Secondary | ICD-10-CM

## 2022-03-01 DIAGNOSIS — W01198A Fall on same level from slipping, tripping and stumbling with subsequent striking against other object, initial encounter: Secondary | ICD-10-CM | POA: Insufficient documentation

## 2022-03-01 DIAGNOSIS — M25511 Pain in right shoulder: Secondary | ICD-10-CM | POA: Insufficient documentation

## 2022-03-01 MED ORDER — KETOROLAC TROMETHAMINE 15 MG/ML IJ SOLN
15.0000 mg | Freq: Once | INTRAMUSCULAR | Status: AC
Start: 2022-03-01 — End: 2022-03-01
  Administered 2022-03-01: 15 mg via INTRAMUSCULAR
  Filled 2022-03-01: qty 1

## 2022-03-01 MED ORDER — OXYCODONE HCL 5 MG PO TABS: 5.0000 mg | ORAL_TABLET | Freq: Four times a day (QID) | ORAL | 0 refills | Status: DC | PRN

## 2022-03-01 MED ORDER — OXYCODONE HCL 5 MG PO TABS
5.0000 mg | ORAL_TABLET | Freq: Once | ORAL | Status: AC
  Administered 2022-03-01: 5 mg via ORAL
  Filled 2022-03-01: qty 1

## 2022-03-01 MED ORDER — ACETAMINOPHEN 325 MG PO TABS
650.0000 mg | ORAL_TABLET | Freq: Once | ORAL | Status: AC
  Administered 2022-03-01: 650 mg via ORAL
  Filled 2022-03-01: qty 2

## 2022-03-01 MED ORDER — IBUPROFEN 800 MG PO TABS: 800.0000 mg | ORAL_TABLET | Freq: Three times a day (TID) | ORAL | 0 refills | Status: DC

## 2022-03-01 MED ORDER — CYCLOBENZAPRINE HCL 10 MG PO TABS
10.0000 mg | ORAL_TABLET | Freq: Once | ORAL | Status: AC
  Administered 2022-03-01: 10 mg via ORAL
  Filled 2022-03-01: qty 1

## 2022-03-01 MED ORDER — CYCLOBENZAPRINE HCL 10 MG PO TABS: 10.0000 mg | ORAL_TABLET | Freq: Three times a day (TID) | ORAL | 0 refills | Status: DC | PRN

## 2022-03-01 NOTE — ED Notes (Signed)
Patient transported to X-ray 
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
## 2022-03-01 NOTE — ED Provider Notes (Signed)
?[facility] ?Provider Note ? ? ?CSN: [id number] ?Arrival date & time: 03/01/22  1638 ? ?  ? ?History ? ?Chief Complaint  ?Patient presents with  ? Shoulder Pain  ? ? ?Wayne Hawkins is a 51 y.o. male. ? ?Patient here with ongoing pain to his right shoulder, right hip after seizure episode several days ago.  [address] and hit his head and landed on his right side pretty hard.  Taking over-the-counter medications without much relief.  Feels like he is having spasms in his hamstring. ? ?The history is provided by the patient.  ?Shoulder Pain ? ?  ? ?Home Medications ?Prior to Admission medications   ?Medication Sig Start Date End Date Taking? Authorizing Provider  ?cyclobenzaprine (FLEXERIL) 10 MG tablet Take 1 tablet (10 mg total) by mouth 3 (three) times daily as needed for up to 20 doses for muscle spasms. 03/01/22  Yes Loryn Haacke, DO  ?ibuprofen (ADVIL) 800 MG tablet Take 1 tablet (800 mg total) by mouth 3 (three) times daily. 03/01/22  Yes Shenaya Lebo, DO  ?oxyCODONE (ROXICODONE) 5 MG immediate release tablet Take 1 tablet (5 mg total) by mouth every 6 (six) hours as needed for up to 10 doses for breakthrough pain. 03/01/22  Yes Ieisha Gao, DO  ?albuterol (PROVENTIL HFA;VENTOLIN HFA) 108 (90 Base) MCG/ACT inhaler Inhale 2 puffs into the lungs every 6 (six) hours as needed for wheezing or shortness of breath.  12/25/18   [provider]  ?budesonide-formoterol (SYMBICORT) 160-4.5 MCG/ACT inhaler Inhale 2 puffs into the lungs 2 (two) times daily.    [provider]  ?diphenhydrAMINE (BENADRYL) 25 MG tablet Take 50 mg by mouth at bedtime as needed for allergies.    [provider]  ?diphenhydramine-acetaminophen (TYLENOL PM) 25-500 MG TABS tablet Take 2 tablets by mouth at bedtime as needed (sleep).    [provider]  ?levETIRAcetam (KEPPRA) 500 MG tablet Take 1 tablet (500 mg total) by mouth 2 (two) times daily. 02/26/22   Blanchie Dessert, MD  ?    ? ?Allergies    ?Patient has no known allergies.   ? ?Review of Systems   ?Review of Systems ? ?Physical Exam ?Updated Vital Signs ?BP (!) 155/93   Pulse 96   Temp 98.6 ?F (37 ?C) (Oral)   Resp 16   SpO2 95%  ?Physical Exam ?Vitals and nursing note reviewed.  ?Constitutional:   ?   General: He is not in acute distress. ?   Appearance: He is well-developed. He is not ill-appearing.  ?HENT:  ?   Head: Normocephalic and atraumatic.  ?   Nose: Nose normal.  ?   Mouth/Throat:  ?   Mouth: Mucous membranes are moist.  ?Eyes:  ?   Extraocular Movements: Extraocular movements intact.  ?   Conjunctiva/sclera: Conjunctivae normal.  ?   Pupils: Pupils are equal, round, and reactive to light.  ?Cardiovascular:  ?   Rate and Rhythm: Normal rate and regular rhythm.  ?   Pulses: Normal pulses.  ?   Heart sounds: Normal heart sounds. No murmur heard. ?Pulmonary:  ?   Effort: Pulmonary effort is normal. No respiratory distress.  ?   Breath sounds: Normal breath sounds.  ?Abdominal:  ?   Palpations: Abdomen is soft.  ?   Tenderness: There is no abdominal tenderness.  ?Musculoskeletal:     ?   General: Tenderness present. No swelling.  ?   Cervical back: Normal range of motion and neck supple.  ?  Comments: Tenderness to the right shoulder but normal range of motion, tenderness to the right gluteal area  ?Skin: ?   General: Skin is warm and dry.  ?   Capillary Refill: Capillary refill takes less than 2 seconds.  ?Neurological:  ?   General: No focal deficit present.  ?   Mental Status: He is alert.  ?Psychiatric:     ?   Mood and Affect: Mood normal.  ? ? ?ED Results / Procedures / Treatments   ?Labs ?(all labs ordered are listed, but only abnormal results are displayed) ?Labs Reviewed - No data to display ? ?EKG ?None ? ?Radiology ?No results found. ? ?Procedures ?Procedures  ? ? ?Medications Ordered in ED ?Medications  ?ketorolac (TORADOL) 15 MG/ML injection 15 mg (15 mg Intramuscular Given 03/01/22 1718)  ?acetaminophen  (TYLENOL) tablet 650 mg (650 mg Oral Given 03/01/22 1718)  ?cyclobenzaprine (FLEXERIL) tablet 10 mg (10 mg Oral Given 03/01/22 1718)  ?oxyCODONE (Oxy IR/ROXICODONE) immediate release tablet 5 mg (5 mg Oral Given 03/01/22 1718)  ? ? ?ED Course/ Medical Decision Making/ A&P ?  ?                        ?Medical Decision Making ?Amount and/or Complexity of Data Reviewed ?Radiology: ordered. ? ?Risk ?OTC drugs. ?Prescription drug management. ? ? ?Wayne Hawkins is here with right shoulder pain, right hip pain for the last several days after a seizure episode.  Normal vitals.  No fever.  Had images done of these areas except for his right shoulder.  He appears to have good range of motion at the right shoulder.  Tender mostly in the anterior part of the right shoulder.  My suspicion that this is likely contusion/bruise.  May be some sciatic symptoms/muscle spasms in the right lower leg.  Neurovascular neuromuscular he is intact.  He has not had any other seizures.  We will get x-ray of the right shoulder because that was not performed 3 days ago.  We will give patient Toradol shot, Tylenol, Flexeril, oxycodone for breakthrough pain. ? ?X-ray per review and interpretation of his right shoulder is negative for fracture or dislocation.  Discharged in good condition.  Overall suspect contusion.  Pain medication prescribed. ? ?This chart was dictated using voice recognition software.  Despite best efforts to proofread,  errors can occur which can change the documentation meaning.  ? ? ? ? ? ? ? ?Final Clinical Impression(s) / ED Diagnoses ?Final diagnoses:  ?None  ? ? ?Rx / DC Orders ?ED Discharge Orders   ? ?      Ordered  ?  cyclobenzaprine (FLEXERIL) 10 MG tablet  3 times daily PRN       ? 03/01/22 1721  ?  oxyCODONE (ROXICODONE) 5 MG immediate release tablet  Every 6 hours PRN       ? 03/01/22 1721  ?  ibuprofen (ADVIL) 800 MG tablet  3 times daily       ? 03/01/22 1721  ? ?  ?  ? ?  ? ? ?  ?Lennice Sites, DO ?03/01/22 1817 ? ?

## 2022-03-01 NOTE — ED Triage Notes (Addendum)
Pt reports multiple complaints: pain to his right shoulder that inhibits him from moving his arm due to the pain; onset this morning upon wakening. Denies known injury. He also reports pain to right hip, right side of jaw and states he has hx of seizures and since his last seizure (this past Thursday) he has had trouble urinating.  ?

## 2022-03-19 ENCOUNTER — Ambulatory Visit: Payer: Self-pay | Admitting: Student

## 2022-03-25 ENCOUNTER — Ambulatory Visit (INDEPENDENT_AMBULATORY_CARE_PROVIDER_SITE_OTHER): Payer: Self-pay | Admitting: Internal Medicine

## 2022-03-25 ENCOUNTER — Encounter: Payer: Self-pay | Admitting: Internal Medicine

## 2022-03-25 ENCOUNTER — Other Ambulatory Visit (HOSPITAL_COMMUNITY): Payer: Self-pay

## 2022-03-25 VITALS — BP 108/77 | Ht 71.0 in | Wt 157.0 lb

## 2022-03-25 DIAGNOSIS — J209 Acute bronchitis, unspecified: Secondary | ICD-10-CM

## 2022-03-25 DIAGNOSIS — M5441 Lumbago with sciatica, right side: Secondary | ICD-10-CM

## 2022-03-25 DIAGNOSIS — M5386 Other specified dorsopathies, lumbar region: Secondary | ICD-10-CM

## 2022-03-25 DIAGNOSIS — J440 Chronic obstructive pulmonary disease with acute lower respiratory infection: Secondary | ICD-10-CM

## 2022-03-25 DIAGNOSIS — F419 Anxiety disorder, unspecified: Secondary | ICD-10-CM

## 2022-03-25 DIAGNOSIS — M25551 Pain in right hip: Secondary | ICD-10-CM

## 2022-03-25 DIAGNOSIS — R569 Unspecified convulsions: Secondary | ICD-10-CM

## 2022-03-25 MED ORDER — GABAPENTIN 300 MG PO CAPS
300.0000 mg | ORAL_CAPSULE | Freq: Three times a day (TID) | ORAL | 2 refills | Status: DC
  Filled 2022-03-25: qty 90, 30d supply, fill #0
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
## 2022-03-25 NOTE — Patient Instructions (Signed)
To Mr. Holyfield,  ? ?It was a pleasure meeting you today. Today we discussed your back pain with radiation and difficulty with urination. Given your symptoms, we are going to image your lower spine to make sure there is nothing pressing on your spinal cord. We will call with the results. Additionally, I will prescribe gabapentin to help with your nerve pain. Please take 300 mg (one table) every 8 hours.  ? ?Additionally, please fill out the provided forms to help with the co pay and labs.  ? ?We will follow back in 4 weeks, pending the results of your MRI.  ? ?Have a good day,  ?Maudie Mercury, MD ?

## 2022-03-26 ENCOUNTER — Other Ambulatory Visit (HOSPITAL_COMMUNITY): Payer: Self-pay

## 2022-03-26 ENCOUNTER — Telehealth: Payer: Self-pay | Admitting: *Deleted

## 2022-03-26 ENCOUNTER — Ambulatory Visit (HOSPITAL_COMMUNITY)
Admission: RE | Admit: 2022-03-26 | Discharge: 2022-03-26 | Disposition: A | Discharge status: Home / Self Care | Payer: Self-pay | Source: Ambulatory Visit | Attending: Internal Medicine | Admitting: Internal Medicine

## 2022-03-26 DIAGNOSIS — M5441 Lumbago with sciatica, right side: Secondary | ICD-10-CM | POA: Insufficient documentation

## 2022-03-26 DIAGNOSIS — M5386 Other specified dorsopathies, lumbar region: Secondary | ICD-10-CM | POA: Insufficient documentation

## 2022-03-26 MED ORDER — DULOXETINE HCL 30 MG PO CPEP
30.0000 mg | ORAL_CAPSULE | Freq: Every day | ORAL | 2 refills | Status: DC
  Filled 2022-03-26: qty 30, 30d supply, fill #0
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
## 2022-03-26 NOTE — Assessment & Plan Note (Addendum)
Patient presents to establish care as well as follow-up for right hip pain that he suffered having a seizure at home.  He states that he has been having right hip, pain that radiates all the way down to the bottom of the foot.  He states his pain is a 10 out of 10, radiation feels like "fire or someone touching me with a hot poker" and that when the pain reaches but this quickly comes.  He has tried NSAIDs, but to no avail.  He states that keeping his leg curled up helps the pain, straightening his leg out exacerbates the pain.  He states that he is able to walk every time strength is likely his pain is aggravated. ? ?He additionally notes that the day after being seen in the ED, he has been having multiple bouts of attempting to use the restroom to micturate, feels like he is unable to complete process.  He states that he used to go once or twice a day, but now tries to go at least 10-20 times a day for outflow.  He denies any loss of bowels or saddle paresthesias. ? ?A/P: ?Patient presents for follow-up on her hip pain, which is since being seen in the ED on 02/26/2022.  At that time patient received a Toradol injection, he additionally had right hip imaging which did not show any acute fractures.  On examination today, he has a positive straight leg test, spasms of the hamstrings.  Given his sciatica, urinary retention, there is concern for nerve compression syndrome.  Discussed with patient we will start him on gabapentin, which will help with his history of seizures.  Patient ambulate, limited secondary to pain.  He does favor the right side.  Additionally discussed further imaging with MRI lumbar spine which he is agreeable with. ?- Stat lumbar spine MRI ?- Gabapentin 300 mg 3 times daily ?- We will call patient with results and next steps, MRI shows cord compression patient will need to be evaluated in the ED. ?

## 2022-03-26 NOTE — Assessment & Plan Note (Signed)
Patient states that he has COPD when he was in his mid 82s, he currently takes albuterol and Symbicort 160.  He has not had a asthma exacerbation recently.  He states that he did have PFTs completed when he was in his 91s that showed COPD, but they are not on file he does not. ?- Continue Symbicort 160 2 puffs twice daily ?- Continue albuterol as needed ?

## 2022-03-26 NOTE — Telephone Encounter (Signed)
Call from,patient is having spasms this morning in his right buttocks.  Saw doctor yesterday was given medication for .  Said he slept longer than usual and unable to stand this am without having the spasm in his right buttock.  Level 10 pain. ?

## 2022-03-26 NOTE — Assessment & Plan Note (Addendum)
At the end of the office visit, the patient states that he has been told before that he is anxious, this could be a source of stress for him.  He states that he was given a packet on his information, and that he would like to know more about anxiety.  Given his concerning his concerning hip evaluation, we discussed that we can follow-up with concerns for anxiety after discussing the results of the MRI, which he was appreciative for.  I discussed that we could probably start him on Cymbalta as this could help with his sciatica. ?- Follow-up MRI spine, will discuss adding on Cymbalta. ?- GAD-7 next office visit ?- Consider discussing with counselor at next visit ? ?Addendum:  ?I spoke with Mr. Buist on 03/26/22 for continued back spasms, further discussed his anxiety, but given his sciatica, we will prescribe duloxetine today.  Patient will likely benefit from evaluation with a GAD-7, counseling treated sonographically yesterday.  Patient still agreeable. ?- Start Cymbalta 30 mg daily ?

## 2022-03-27 ENCOUNTER — Telehealth: Payer: Self-pay

## 2022-03-27 ENCOUNTER — Other Ambulatory Visit (HOSPITAL_COMMUNITY): Payer: Self-pay

## 2022-03-27 ENCOUNTER — Other Ambulatory Visit: Payer: Self-pay | Admitting: Internal Medicine

## 2022-03-27 MED ORDER — CYCLOBENZAPRINE HCL 5 MG PO TABS
5.0000 mg | ORAL_TABLET | Freq: Three times a day (TID) | ORAL | 0 refills | Status: DC | PRN
  Filled 2022-03-27: qty 30, 10d supply, fill #0

## 2022-03-27 NOTE — Addendum Note (Signed)
Addended by: Dolan Amen C on: 03/27/2022 03:00 PM ? ? Modules accepted: Orders ? ?

## 2022-03-27 NOTE — Telephone Encounter (Signed)
Patient is calling our office because he is at the [facility] to pick up Flexeril.  They are telling him that the medication was not called into their location.  Forwarding to triage and Dr. Gilford Rile.  Call back number is [phone]. ?

## 2022-03-27 NOTE — Addendum Note (Signed)
Addended by: Maudie Mercury C on: 03/27/2022 03:07 PM ? ? Modules accepted: Orders ? ?

## 2022-03-27 NOTE — Telephone Encounter (Signed)
Requesting MRI results, please call pt back.  

## 2022-03-27 NOTE — Telephone Encounter (Signed)
Return to pt who stated he did not pick Flexeril last month. Informed pt it was prescribed by Dr Ronnald Nian ; stated he forgot. And he was informed it was sent to [facility] on [address]. Very appreciative; sorry for any inconvenience. ? ?He also requested MRI results. ?

## 2022-03-30 ENCOUNTER — Telehealth: Payer: Self-pay

## 2022-03-30 NOTE — Telephone Encounter (Signed)
Pt is requesting a call back .. he stated that he is ian a lot of pain and discomfort  ?

## 2022-03-30 NOTE — Progress Notes (Signed)
Internal Medicine Clinic Attending ? ?Case discussed with Dr. Sande Brothers  At the time of the visit.  We reviewed the resident?s history and exam and pertinent patient test results.  I agree with the assessment, diagnosis, and plan of care documented in the resident?s note. History of seizure disorder, last seizure prior to event 02/26/22 was about three years prior per resident history. Mr. Kuechle noted he has not been sleeping well recently which may have lowered seizure threshold. Dr. Sande Brothers and I reviewed instructions to avoid driving, continue Keppra. Addition of gabapentin for neuropathic pain likely to help prevention of seizures as well. Will plan to refer to neurology pending insurance coverage. ?

## 2022-03-31 ENCOUNTER — Other Ambulatory Visit (HOSPITAL_COMMUNITY): Payer: Self-pay

## 2022-03-31 MED ORDER — CYCLOBENZAPRINE HCL 5 MG PO TABS
10.0000 mg | ORAL_TABLET | Freq: Three times a day (TID) | ORAL | 0 refills | Status: DC | PRN
  Filled 2022-03-31: qty 90, 15d supply, fill #0

## 2022-03-31 MED ORDER — CYCLOBENZAPRINE HCL 10 MG PO TABS
10.0000 mg | ORAL_TABLET | Freq: Three times a day (TID) | ORAL | 0 refills | Status: DC | PRN
  Filled 2022-03-31: qty 90, 30d supply, fill #0

## 2022-03-31 NOTE — Telephone Encounter (Signed)
Call from pt - stated he';s having severe pain right hip and buttocks. And sometimes his right leg "gives away" and he goes to the floor. He was seen 5/3 by Dr Sande Brothers; also MRI of spine done 5/4. Stated he's doing everything per Dr Sande Brothers and has not helped. "I need something for this pain". ?

## 2022-04-22 ENCOUNTER — Other Ambulatory Visit (HOSPITAL_COMMUNITY): Payer: Self-pay

## 2022-04-22 ENCOUNTER — Telehealth: Payer: Self-pay | Admitting: *Deleted

## 2022-04-22 ENCOUNTER — Ambulatory Visit (INDEPENDENT_AMBULATORY_CARE_PROVIDER_SITE_OTHER): Payer: Self-pay | Admitting: Internal Medicine

## 2022-04-22 VITALS — BP 127/88 | HR 106 | Temp 98.4°F | Wt 166.1 lb

## 2022-04-22 DIAGNOSIS — N529 Male erectile dysfunction, unspecified: Secondary | ICD-10-CM

## 2022-04-22 DIAGNOSIS — M5386 Other specified dorsopathies, lumbar region: Secondary | ICD-10-CM

## 2022-04-22 MED ORDER — CYCLOBENZAPRINE HCL 10 MG PO TABS
10.0000 mg | ORAL_TABLET | Freq: Three times a day (TID) | ORAL | 3 refills | Status: DC | PRN
Start: 2022-04-22 — End: 2022-04-22

## 2022-04-22 MED ORDER — CYCLOBENZAPRINE HCL 10 MG PO TABS
10.0000 mg | ORAL_TABLET | Freq: Three times a day (TID) | ORAL | 3 refills | Status: DC | PRN
  Filled 2022-04-22: qty 90, 30d supply, fill #0
  Filled 2022-05-29: qty 90, 30d supply, fill #1
  Filled 2022-06-29: qty 90, 30d supply, fill #2
  Filled 2022-07-28: qty 90, 30d supply, fill #3

## 2022-04-22 MED ORDER — GABAPENTIN 600 MG PO TABS
600.0000 mg | ORAL_TABLET | Freq: Three times a day (TID) | ORAL | 3 refills | Status: DC
  Filled 2022-04-22: qty 90, 30d supply, fill #0
  Filled 2023-02-08: qty 90, 30d supply, fill #1

## 2022-04-22 MED ORDER — DULOXETINE HCL 60 MG PO CPEP: 60.0000 mg | ORAL_CAPSULE | Freq: Every day | ORAL | 3 refills | Status: DC

## 2022-04-22 MED ORDER — GABAPENTIN 600 MG PO TABS: 600.0000 mg | ORAL_TABLET | Freq: Three times a day (TID) | ORAL | 3 refills | Status: DC

## 2022-04-22 MED ORDER — DULOXETINE HCL 60 MG PO CPEP
60.0000 mg | ORAL_CAPSULE | Freq: Every day | ORAL | 3 refills | Status: DC
  Filled 2022-04-22: qty 30, 30d supply, fill #0
  Filled 2022-05-29: qty 30, 30d supply, fill #1
  Filled 2022-06-29: qty 30, 30d supply, fill #2
  Filled 2022-07-28: qty 30, 30d supply, fill #3
  Filled 2023-02-08: qty 30, 30d supply, fill #4

## 2022-04-22 NOTE — Patient Instructions (Signed)
To Mr. Wayne Hawkins,   It was a pleasure meeting you again! Today we discussed your sciatica.   Today, we are increasing your Cymbalta to 60 mg daily as well as gabapentin to 600 mg three times daily. I have also refilled your Flexeril. Additionally, I am placing an order for PT to evaluate and work with you. We will have you come back to the clinic in 1 month to see how you are doing. Once we have the orange card we will be able to have you referred for further pain management.   Have a good day,  Dolan Amen, MD

## 2022-04-22 NOTE — Telephone Encounter (Signed)
Call from patient ststes he is having a lot of pain in his right hip goes to buttocks and leg.  Calves are throbbing as well.  Patient given an appointment this afternoon at 3:45 PM.  Asked to come in around 3:15 PM

## 2022-04-23 DIAGNOSIS — N529 Male erectile dysfunction, unspecified: Secondary | ICD-10-CM | POA: Insufficient documentation

## 2022-04-23 NOTE — Assessment & Plan Note (Addendum)
Patient presents for follow-up on his right-sided sciatica.  He states that since our last visit, he has had continued pain, with the burning shooting sensation continuing to radiate down to his right foot and toes, with some radiation to the inner thigh. He notes that the Flexeril, gabapentin, and duloxetine have helped his pain somewhat, but he still does experience pain. He has not had any falls that he recalls since last visit.  He is able to walk short distances, but given his very active lifestyle this has been very limiting to his quality of life.  He additionally notes that sometimes he will have continued sharp right-sided hip pain that lasts several seconds.  He states that he is nervous that he is going to fall again, and now carries his phone with him everywhere he goes in case he does have a fall, which has not occurred.  He states that now that he typically takes care of multiple animals, and is finding hard to cope with how limited he is in his basic functions.  He is thankful that his friends and loved ones are close and helping him make appointments via transportation and being there for him.  He notes that because of his limitations, he has had increasing his anxiety.  He does endorse that he increased his gabapentin to 600 mg 3 times daily over the past several days to see if this will help alleviate the pain.  He continues to do gentle yoga exercises, takes Tylenol, Flexeril, ibuprofen as needed, duloxetine, and gabapentin.  A/P: Patient presents with sciatica after sustaining a fall with seizure-like activity.  During his visit to our clinic, he endorsed having some difficulty with urination, but denied any fevers, and saddle like paresthesias.  Given the concern for cauda equina syndrome, urgent MRI was ordered of the lumbar spine which showed mild to moderate spinal canal stenosis at L2-L3, L3-L4. There was an asymmetric right disc bulge with right paracentral disc protrusion resulting in  moderate spinal stenosis at L4-L5 with possible impingement of L5 nerve root.  He states that he still does endorse some issues with urination, which appears to be mostly secondary to pain when sitting on the toilet.  We discussed further modalities including physical therapy, spinal injections, while optimizing medical management, which patient is agreeable to trial.  He is currently working on his orange card, which his sister is filling out as she is a Child psychotherapist in [place], and will have this in as soon as possible.  He would like referral to physical therapy, even if he would come out of his pocket.  We discussed that once patient has the orange card, we will be able to refer him to pain management or neurosurgery for spinal injections, which the patient is ultimately working towards. - Increase Cymbalta to 60 mg daily - Increase gabapentin 600 mg 3 times daily - Continue Flexeril 10 mg 3 times daily as needed - Ambulatory referral to PT - Patient to work on orange card, when qualified, referred to neurosurgery for spinal injections.

## 2022-04-23 NOTE — Progress Notes (Signed)
   CC: Back Pain Follow Up  HPI:  [name] is a 51 y.o. person, with a PMH noted below, who presents to the clinic for follow-up on his back pain. To see the management of their acute and chronic conditions, please see the A&P note under the Encounters tab.   Past Medical History:  Diagnosis Date   Asthma    COPD (chronic obstructive pulmonary disease) (HCC)    Seizures (HCC)    Review of Systems:   Review of Systems  Constitutional:  Negative for chills, fever, malaise/fatigue and weight loss.  Musculoskeletal:  Positive for back pain. Negative for falls.  Neurological:  Negative for dizziness, seizures, weakness and headaches.  Psychiatric/Behavioral:  The patient is nervous/anxious.     Physical Exam:  Vitals:   04/22/22 1526  BP: 127/88  Pulse: (!) 106  Temp: 98.4 F (36.9 C)  TempSrc: Oral  SpO2: 100%  Weight: 166 lb 1.6 oz (75.3 kg)   Physical Exam Constitutional:      Comments: Patient sitting in chair, appears mildly uncomfortable, shifting regularly during the interview.  Answers questions appropriately.  HENT:     Head: Normocephalic and atraumatic.  Musculoskeletal:     Right lower leg: No edema.     Left lower leg: No edema.  Neurological:     Mental Status: He is oriented to person, place, and time.  Psychiatric:     Comments: Anxious appearing     Assessment & Plan:   See Encounters Tab for problem based charting.  Patient discussed with Dr. Heide Spark  No problem-specific Assessment & Plan notes found for this encounter.

## 2022-04-29 ENCOUNTER — Other Ambulatory Visit: Payer: Self-pay

## 2022-04-29 ENCOUNTER — Ambulatory Visit (INDEPENDENT_AMBULATORY_CARE_PROVIDER_SITE_OTHER): Payer: Self-pay | Admitting: Student

## 2022-04-29 ENCOUNTER — Encounter: Payer: Self-pay | Admitting: Student

## 2022-04-29 ENCOUNTER — Other Ambulatory Visit (HOSPITAL_COMMUNITY): Payer: Self-pay

## 2022-04-29 DIAGNOSIS — M5386 Other specified dorsopathies, lumbar region: Secondary | ICD-10-CM

## 2022-04-29 DIAGNOSIS — R569 Unspecified convulsions: Secondary | ICD-10-CM

## 2022-04-29 MED ORDER — LEVETIRACETAM 500 MG PO TABS
500.0000 mg | ORAL_TABLET | Freq: Two times a day (BID) | ORAL | 0 refills | Status: DC
  Filled 2022-04-29 – 2022-05-29 (×2): qty 60, 30d supply, fill #0

## 2022-04-29 NOTE — Progress Notes (Signed)
   CC: f/u sciatica  HPI:  Wayne Hawkins is a 51 y.o. male with history listed below presenting to the [facility] for f/u sciatica. Please see individualized problem based charting for full HPI.  Past Medical History:  Diagnosis Date   Asthma    COPD (chronic obstructive pulmonary disease) (HCC)    Seizures (HCC)     Review of Systems:  Negative aside from that listed in individualized problem based charting.  Physical Exam:  Vitals:   04/29/22 1401 04/29/22 1410  BP: (!) 123/104 118/83  Pulse: (!) 101 98  Temp: 98 F (36.7 C)   TempSrc: Oral   SpO2: 98%   Weight: 168 lb 8 oz (76.4 kg)   Height: 6' (1.829 m)    Physical Exam Constitutional:      Appearance: He is normal weight. He is not ill-appearing.  HENT:     Mouth/Throat:     Mouth: Mucous membranes are moist.     Pharynx: Oropharynx is clear.  Eyes:     Extraocular Movements: Extraocular movements intact.     Conjunctiva/sclera: Conjunctivae normal.     Pupils: Pupils are equal, round, and reactive to light.  Cardiovascular:     Rate and Rhythm: Normal rate and regular rhythm.     Pulses: Normal pulses.     Heart sounds: Normal heart sounds. No murmur heard.   No gallop.  Pulmonary:     Effort: Pulmonary effort is normal.     Breath sounds: Normal breath sounds. No wheezing, rhonchi or rales.  Abdominal:     General: Bowel sounds are normal. There is no distension.     Palpations: Abdomen is soft.     Tenderness: There is no abdominal tenderness.  Musculoskeletal:        General: No swelling.  Skin:    General: Skin is warm and dry.  Neurological:     Mental Status: He is alert and oriented to person, place, and time. Mental status is at baseline.     Gait: Gait abnormal.  Psychiatric:        Behavior: Behavior normal.     Comments: Anxious mood     Assessment & Plan:   See Encounters Tab for problem based charting.  Patient discussed with Dr.  Mayford Knife

## 2022-04-29 NOTE — Assessment & Plan Note (Signed)
Patient presents for f/u of right sided sciatica. He continues to have pain similar to what is described in Dr. Alphonsa Overall note from last visit on 04/23/2022. He has been using tylenol prn, ibuprofen prn, flexeril, gabapentin, and duloxetine with minimal relief of symptoms. Denies any falls since last visit. This is a functionally limiting issue for him and is impacting his ability to care for his animals at home. He was referred to physical therapy at last visit but has been unable to set up appointment yet. PT called patient yesterday but he wished to discuss with his sister (who manages his medications and appointments). I received patient's permission to contact sister and received her phone number from patient. Chilon was able to send sister, Wendy's, number to physical therapy group and they will reach out to her to schedule his appointment. Discussed that physical therapy will provide benefit in patient's symptoms. He confirms understanding. Should therapy fail to provide symptomatic relief, may need to consider referral to pain clinic for pain control to allow patient to become more functional.  Plan: - continue current medications, yoga exercises -f/u with physical therapy -if no improvement, consider referral to pain clinic for symptom control to allow him to become more functional

## 2022-04-29 NOTE — Assessment & Plan Note (Signed)
No seizure activity since last visit. Refilled patient's keppra today.

## 2022-04-29 NOTE — Patient Instructions (Signed)
Mr. Wayne Hawkins,  It was a pleasure seeing you in the clinic today.   I am very sorry that your symptoms have not become better control, but I do think that physical therapy will help. We have sent over your sister, Wayne Hawkins's, contact to them so that they can schedule your appointment. Please come back in 1 month, or sooner if needed, for follow up of your sciatica pain. I have refilled your keppra for you.  Please call our clinic at [phone] if you have any questions or concerns. The best time to call is Monday-Friday from 9am-4pm, but there is someone available 24/7 at the same number. If you need medication refills, please notify your pharmacy one week in advance and they will send [place] a request.   Thank you for letting us take part in your care. We look forward to seeing you next time!

## 2022-05-01 NOTE — Progress Notes (Signed)
Internal Medicine Clinic Attending ? ?Case discussed with Dr. Winters  At the time of the visit.  We reviewed the resident?s history and exam and pertinent patient test results.  I agree with the assessment, diagnosis, and plan of care documented in the resident?s note.  ?

## 2022-05-06 NOTE — Progress Notes (Addendum)
Internal Medicine Clinic Attending  Case discussed with Dr. Jinwala at the time of the visit.  We reviewed the resident's history and exam and pertinent patient test results.  I agree with the assessment, diagnosis, and plan of care documented in the resident's note.  

## 2022-05-07 ENCOUNTER — Other Ambulatory Visit (HOSPITAL_COMMUNITY): Payer: Self-pay

## 2022-05-12 ENCOUNTER — Encounter (HOSPITAL_COMMUNITY): Payer: Self-pay

## 2022-05-12 ENCOUNTER — Other Ambulatory Visit: Payer: Self-pay

## 2022-05-12 ENCOUNTER — Emergency Department (HOSPITAL_COMMUNITY)
Admission: EM | Admit: 2022-05-12 | Discharge: 2022-05-12 | Disposition: A | Discharge status: Home / Self Care | Payer: Self-pay | Attending: Emergency Medicine | Admitting: Emergency Medicine

## 2022-05-12 ENCOUNTER — Emergency Department (HOSPITAL_COMMUNITY): Payer: Self-pay

## 2022-05-12 DIAGNOSIS — Z5321 Procedure and treatment not carried out due to patient leaving prior to being seen by health care provider: Secondary | ICD-10-CM | POA: Insufficient documentation

## 2022-05-12 DIAGNOSIS — W19XXXA Unspecified fall, initial encounter: Secondary | ICD-10-CM | POA: Insufficient documentation

## 2022-05-12 DIAGNOSIS — M79661 Pain in right lower leg: Secondary | ICD-10-CM | POA: Insufficient documentation

## 2022-05-12 DIAGNOSIS — R569 Unspecified convulsions: Secondary | ICD-10-CM | POA: Insufficient documentation

## 2022-05-12 DIAGNOSIS — M25561 Pain in right knee: Secondary | ICD-10-CM | POA: Insufficient documentation

## 2022-05-12 DIAGNOSIS — M25551 Pain in right hip: Secondary | ICD-10-CM | POA: Insufficient documentation

## 2022-05-12 HISTORY — DX: Unspecified intracranial injury with loss of consciousness status unknown, initial encounter: S06.9XAA

## 2022-05-12 MED ORDER — OXYCODONE-ACETAMINOPHEN 5-325 MG PO TABS
1.0000 | ORAL_TABLET | Freq: Once | ORAL | Status: AC
  Administered 2022-05-12: 1 via ORAL
  Filled 2022-05-12: qty 1

## 2022-05-12 NOTE — ED Notes (Signed)
Patient states he will come back tomorrow d/t wait time

## 2022-05-12 NOTE — ED Triage Notes (Signed)
Pt reports he had a seizure resulting in a fall 3 weeks ago and now reports pain from right hip down his leg into his foot. States the pain has been there since his fall.  Hx of TBI and stuttering.

## 2022-05-12 NOTE — ED Provider Triage Note (Signed)
Emergency Medicine Provider Triage Evaluation Note  Wayne Hawkins , a 51 y.o. male  was evaluated in triage.  Pt complains of seizure onset 3 weeks ago.  During the seizure he fell and has had pain to his right hip since the seizure.  Notes that he has been able to ambulate since the fall.  Also has right knee pain and right shin pain. Here for ongoing hip pain.   Review of Systems  Positive: As per HPI above Negative:   Physical Exam  BP (!) 131/93 (BP Location: Right Arm)   Pulse 98   Temp 98.4 F (36.9 C) (Oral)   Resp 14   Ht 6' (1.829 m)   Wt 75.8 kg   SpO2 98%   BMI 22.65 kg/m  Gen:   Awake, no distress   Resp:  Normal effort  MSK:   Moves extremities without difficulty  Other:  Tenderness to palpation noted to right anterior hip.  Mild tenderness to palpation noted to right tib-fib.  No tenderness to palpation noted to right knee.  Medical Decision Making  Medically screening exam initiated at 6:29 PM.  Appropriate orders placed.  Riccardo Holeman was informed that the remainder of the evaluation will be completed by another provider, this initial triage assessment does not replace that evaluation, and the importance of remaining in the ED until their evaluation is complete.  Work-up initiated   Julieth Tugman A, PA-C 05/12/22 1838

## 2022-05-16 ENCOUNTER — Telehealth: Payer: Self-pay

## 2022-05-27 ENCOUNTER — Ambulatory Visit: Payer: Self-pay

## 2022-05-29 ENCOUNTER — Other Ambulatory Visit (HOSPITAL_COMMUNITY): Payer: Self-pay

## 2022-06-29 ENCOUNTER — Other Ambulatory Visit (HOSPITAL_COMMUNITY): Payer: Self-pay

## 2022-07-02 ENCOUNTER — Ambulatory Visit (INDEPENDENT_AMBULATORY_CARE_PROVIDER_SITE_OTHER): Payer: Self-pay | Admitting: Internal Medicine

## 2022-07-02 ENCOUNTER — Other Ambulatory Visit (HOSPITAL_COMMUNITY): Payer: Self-pay

## 2022-07-02 VITALS — BP 116/83 | HR 97 | Wt 170.0 lb

## 2022-07-02 DIAGNOSIS — R569 Unspecified convulsions: Secondary | ICD-10-CM

## 2022-07-02 MED ORDER — VALTOCO 10 MG DOSE 10 MG/0.1ML NA LIQD
20.0000 mg | NASAL | 5 refills | Status: DC | PRN
  Filled 2022-07-02: qty 4, 1d supply, fill #0

## 2022-07-02 MED ORDER — LEVETIRACETAM 500 MG PO TABS
500.0000 mg | ORAL_TABLET | Freq: Two times a day (BID) | ORAL | 3 refills | Status: DC
  Filled 2022-07-02: qty 60, 30d supply, fill #0
  Filled 2023-01-22: qty 60, 30d supply, fill #1
  Filled 2023-02-08 – 2023-02-26 (×2): qty 60, 30d supply, fill #2

## 2022-07-02 MED ORDER — LEVETIRACETAM ER 500 MG PO TB24
2000.0000 mg | ORAL_TABLET | Freq: Once | ORAL | Status: AC
  Administered 2022-07-02: 2000 mg via ORAL

## 2022-07-02 NOTE — Assessment & Plan Note (Addendum)
Wayne Hawkins states that he has a history of seizures/epilepsy he presents to the [facility] today for a follow-up of his seizures.  He is prescribed Keppra 500 mg twice daily, however, he states that he only takes this medication once a day, and he was unaware that he was supposed to take it twice a day.  The patient also has a history of a TBI as a kid and has memory issues/difficulty with processing information. His sister usually accompanies him to his visits, however, she was not available to come today. The patient last had a seizure over the weekend-although he does not remember the episode, he states that his roommate walked in and found him seizing/full body shaking, and when the seizing stopped, remained noted that he was confused (post-ictal). He denies any urinary incontinence or tongue biting.  Unsure how long the patient was actively seizing for and he was not given any abortive therapies. EMS was not called, as the patient recovered from his seizure quicker than he usually does. His last seizure prior to this episode was in April- was seen in the ED after this episode.  The patient does not regularly follow with neurology, as he does not have any insurance.  His sister is assisting him with getting insurance, although there is reportedly a delay in getting paperwork from the IRS.   The patient was in his usual state of health and was feeling well at the beginning of his visit, however upon my neuro exam of the patient with extraocular muscle testing, he began to develop a left-sided headache and felt dizzy/lightheaded. He also felt like his vision was getting blurry and stated that he felt unsteady. He also felt that he was breathing heavier and felt as if he was blinking uncontrollably. Blood pressure was immediately checked at the onset of his symptoms and was stable (SBP 119). He was given some water and laid his head down, and after a few minutes, he began to feel better.  Patient states that he  occassionally feels this way before the onset of his seizures, however he did not have any witnessed seizure activity at this time. The patient was loaded with 2000 mg of Keppra in the clinic.  After about 30 minutes, the patient continued to feel well, and he called his roommate to pick him up.  I advised the patient that if begins to feel this way or if he has any witnessed seizure activity, that he should come to the emergency room.  I also have prescribed abortive therapy for acute seizures and have given the patient information to pass along to his roommate about this.   Plan: - Keppra 500 mg bid - Prescribed Valtoco (intranasal diazepam) 20 mg for abortive therapy  - Patient is aware that he is not to operate any motor vehicles or use heavy equipment/power tools

## 2022-07-02 NOTE — Patient Instructions (Addendum)
Thank you, [name] for allowing [place] to provide your care today. Today we discussed:  Seizures: Please take Keppra twice a day (once in the morning and once at night) to help prevent any seizures. If you have any questions about this please let us know. Please also call us if you begin to feel dizzy/lightheaded again or if you vision changes.   I have also sent in a medication called Valtoco - it is a nasal spray and your roommate can give it to you while you are having an active seizure. Based on your weight, he should give you 20 mg of Valtoco when he sees you seize. I have attached some instructions on this medication to this packet.   I have ordered the following labs for you:  Lab Orders  No laboratory test(s) ordered today      Referrals ordered today:   Referral Orders  No referral(s) requested today     I have ordered the following medication/changed the following medications:   Stop the following medications: Medications Discontinued During This Encounter  Medication Reason   levETIRAcetam (KEPPRA) 500 MG tablet Reorder     Start the following medications: Meds ordered this encounter  Medications   levETIRAcetam (KEPPRA XR) 24 hr tablet 2,000 mg   levETIRAcetam (KEPPRA) 500 MG tablet    Sig: Take 1 tablet (500 mg total) by mouth 2 (two) times daily.    Dispense:  60 tablet    Refill:  3    IM program     Follow up:  1 month  for seizures    Should you have any questions or concerns please call the internal medicine clinic at [phone].     Wayne Hawkins, D.O. [facility]

## 2022-07-02 NOTE — Progress Notes (Signed)
CC: seizure  HPI:  Mr.Dylann Detty is a 51 y.o. male living with a history stated below and presents today for seizure. Please see problem based assessment and plan for additional details.  Past Medical History:  Diagnosis Date   Asthma    COPD (chronic obstructive pulmonary disease) (HCC)    Seizures (HCC)    TBI (traumatic brain injury) (HCC)    age 53    Current Outpatient Medications on File Prior to Visit  Medication Sig Dispense Refill   albuterol (PROVENTIL HFA;VENTOLIN HFA) 108 (90 Base) MCG/ACT inhaler Inhale 2 puffs into the lungs every 6 (six) hours as needed for wheezing or shortness of breath.      budesonide-formoterol (SYMBICORT) 160-4.5 MCG/ACT inhaler Inhale 2 puffs into the lungs 2 (two) times daily.     cyclobenzaprine (FLEXERIL) 10 MG tablet Take 1 tablet (10 mg total) by mouth 3 (three) times daily as needed for muscle spasms. 90 tablet 3   diphenhydrAMINE (BENADRYL) 25 MG tablet Take 50 mg by mouth at bedtime as needed for allergies.     diphenhydramine-acetaminophen (TYLENOL PM) 25-500 MG TABS tablet Take 2 tablets by mouth at bedtime as needed (sleep).     DULoxetine (CYMBALTA) 60 MG capsule Take 1 capsule (60 mg total) by mouth daily. 90 capsule 3   gabapentin (NEURONTIN) 600 MG tablet Take 1 tablet (600 mg total) by mouth 3 (three) times daily. 180 tablet 3   ibuprofen (ADVIL) 800 MG tablet Take 1 tablet (800 mg total) by mouth 3 (three) times daily. 21 tablet 0   No current facility-administered medications on file prior to visit.    Family History  Problem Relation Age of Onset   Hypertension Mother    Hypertension Father     Social History   Socioeconomic History   Marital status: Single    Spouse name: Not on file   Number of children: Not on file   Years of education: Not on file   Highest education level: Not on file  Occupational History   Not on file  Tobacco Use   Smoking status: Former    Packs/day: 1.00    Years: 16.00     Total pack years: 16.00    Types: Cigarettes    Quit date: 11/20/2016    Years since quitting: 5.6   Smokeless tobacco: Never   Tobacco comments:    quit 2 yrs ago   Vaping Use   Vaping Use: Former   Quit date: 05/20/2018   Substances: Nicotine, Mixture of cannabinoids  Substance and Sexual Activity   Alcohol use: No   Drug use: Not Currently    Types: Marijuana    Comment: quit 2 yrs ago    Sexual activity: Yes    Birth control/protection: None  Other Topics Concern   Not on file  Social History Narrative   Not on file   Social Determinants of Health   Financial Resource Strain: Not on file  Food Insecurity: Not on file  Transportation Needs: Not on file  Physical Activity: Not on file  Stress: Not on file  Social Connections: Not on file  Intimate Partner Violence: Not on file    Review of Systems: ROS negative except for what is noted on the assessment and plan.  Vitals:   07/02/22 1423  BP: 116/83  Pulse: 97  SpO2: 100%  Weight: 170 lb (77.1 kg)    Physical Exam: Constitutional: anxious-appearing male sitting in chair, in no acute distress Eyes:  conjunctiva non-erythematous, EOMI, PERRL Cardiovascular: regular rate and rhythm, no m/r/g Pulmonary/Chest: normal work of breathing on room air, lungs clear to auscultation bilaterally Abdominal: soft, non-tender, non-distended MSK: normal bulk and tone Neurological: alert & oriented x 3, CN II-XII intact. 5/5 strength bilateral upper and lower extremities. Skin: warm and dry Psych: normal mood and behavior  Assessment & Plan:     Patient seen with Dr. Antony Contras  Observed seizure-like activity Mr. Lipford states that he has a history of seizures/epilepsy he presents to the [facility] today for a follow-up of his seizures.  He is prescribed Keppra 500 mg twice daily, however, he states that he only takes this medication once a day, and he was unaware that he was supposed to take it twice a day.  The patient also has  a history of a TBI as a kid and has memory issues/difficulty with processing information. His sister usually accompanies him to his visits, however, she was not available to come today. The patient last had a seizure over the weekend-although he does not remember the episode, he states that his roommate walked in and found him seizing/full body shaking, and when the seizing stopped, remained noted that he was confused (post-ictal). He denies any urinary incontinence or tongue biting.  Unsure how long the patient was actively seizing for and he was not given any abortive therapies. EMS was not called, as the patient recovered from his seizure quicker than he usually does. His last seizure prior to this episode was in April- was seen in the ED after this episode.  The patient does not regularly follow with neurology, as he does not have any insurance.  His sister is assisting him with getting insurance, although there is reportedly a delay in getting paperwork from the IRS.   The patient was in his usual state of health and was feeling well at the beginning of his visit, however upon my neuro exam of the patient with extraocular muscle testing, he began to develop a left-sided headache and felt dizzy/lightheaded. He also felt like his vision was getting blurry and stated that he felt unsteady. He also felt that he was breathing heavier and felt as if he was blinking uncontrollably. Blood pressure was immediately checked at the onset of his symptoms and was stable (SBP 119). He was given some water and laid his head down, and after a few minutes, he began to feel better.  Patient states that he occassionally feels this way before the onset of his seizures, however he did not have any witnessed seizure activity at this time. The patient was loaded with 2000 mg of Keppra in the clinic.  After about 30 minutes, the patient continued to feel well, and he called his roommate to pick him up.  I advised the patient that if  begins to feel this way or if he has any witnessed seizure activity, that he should come to the emergency room.  I also have prescribed abortive therapy for acute seizures and have given the patient information to pass along to his roommate about this.   Plan: - Keppra 500 mg bid - Prescribed Valtoco (intranasal diazepam) 20 mg for abortive therapy  - Patient is aware that he is not to operate any motor vehicles or use heavy equipment/power tools     Mekia Dipinto, D.O. [facility], PGY-2 Phone: [phone] Date 07/02/2022 Time 3:52 PM

## 2022-07-06 NOTE — Progress Notes (Signed)
Internal Medicine Clinic Attending ° °I saw and evaluated the patient.  I personally confirmed the key portions of the history and exam documented by Dr. Atway and I reviewed pertinent patient test results.  The assessment, diagnosis, and plan were formulated together and I agree with the documentation in the resident’s note.  °

## 2022-07-28 ENCOUNTER — Other Ambulatory Visit (HOSPITAL_COMMUNITY): Payer: Self-pay

## 2022-09-27 ENCOUNTER — Emergency Department (HOSPITAL_COMMUNITY): Payer: Self-pay

## 2022-09-27 ENCOUNTER — Other Ambulatory Visit: Payer: Self-pay

## 2022-09-27 ENCOUNTER — Emergency Department (HOSPITAL_COMMUNITY)
Admission: EM | Admit: 2022-09-27 | Discharge: 2022-09-28 | Discharge status: Against Medical Advice | Payer: Self-pay | Attending: Emergency Medicine | Admitting: Emergency Medicine

## 2022-09-27 ENCOUNTER — Encounter (HOSPITAL_COMMUNITY): Payer: Self-pay | Admitting: Emergency Medicine

## 2022-09-27 DIAGNOSIS — Z5321 Procedure and treatment not carried out due to patient leaving prior to being seen by health care provider: Secondary | ICD-10-CM | POA: Insufficient documentation

## 2022-09-27 DIAGNOSIS — M25512 Pain in left shoulder: Secondary | ICD-10-CM | POA: Insufficient documentation

## 2022-09-27 NOTE — ED Triage Notes (Signed)
Pt reports left shoulder pain.  He thinks it is due to having a seizure (hx of such.) He is unable to lift it above his head.

## 2022-09-27 NOTE — ED Provider Triage Note (Signed)
Emergency Medicine Provider Triage Evaluation Note  Wayne Hawkins , a 51 y.o. male  was evaluated in triage.  Pt complains of left shoulder pain upon waking up this morning.   He has a history of seizure disorder.  States he is injured his right shoulder in the past, question if this was related to a seizure.  Denies distal numbness or tingling.  Pain is worse with movement of the shoulder.  States that Toradol has helped him in the past with pain.  Review of Systems  Positive: Shoulder pain Negative: Numbness or tingling.  Physical Exam  BP 112/82 (BP Location: Right Arm)   Pulse 97   Temp (!) 97.4 F (36.3 C) (Oral)   Resp (!) 22   SpO2 100%  Gen:   Awake, no distress   Resp:  Normal effort  MSK:   Moves extremities without difficulty  Other:  Lateral shoulder tenderness to palpation, decreased range of motion secondary to pain, patient flexes and extends the elbow without difficulty  Medical Decision Making  Medically screening exam initiated at 8:53 PM.  Appropriate orders placed.  Garret Teale was informed that the remainder of the evaluation will be completed by another provider, this initial triage assessment does not replace that evaluation, and the importance of remaining in the ED until their evaluation is complete.     Carlisle Cater, PA-C 09/27/22 2054

## 2022-09-28 NOTE — ED Notes (Addendum)
Pt approached this tech as soon as I walked in demanding to know why he hadn't been taken to a room yet because he'd been waiting over 9 1/2 hours and any other time he'd been brought to this hospital he's always went straight back to a room. I advised the pt that his wait time was right at 6 hours and that once there was a room available he could go back and see a doctor but there were still quite a few people ahead of him that had been waiting longer than him. He stated "that this was the worst hospital he'd ever been to and we were the worst nurses he'd ever had, that everybody in this room was the worst people that could ever work in a hospital and that he was leaving and never coming back here again." Moving pt OTF.

## 2022-09-29 ENCOUNTER — Telehealth: Payer: Self-pay

## 2022-09-29 ENCOUNTER — Emergency Department (HOSPITAL_COMMUNITY): Payer: Self-pay

## 2022-09-29 ENCOUNTER — Emergency Department (HOSPITAL_COMMUNITY)
Admission: EM | Admit: 2022-09-29 | Discharge: 2022-09-29 | Disposition: A | Discharge status: Home / Self Care | Payer: Self-pay | Attending: Emergency Medicine | Admitting: Emergency Medicine

## 2022-09-29 ENCOUNTER — Other Ambulatory Visit: Payer: Self-pay

## 2022-09-29 ENCOUNTER — Encounter (HOSPITAL_COMMUNITY): Payer: Self-pay

## 2022-09-29 DIAGNOSIS — J45909 Unspecified asthma, uncomplicated: Secondary | ICD-10-CM | POA: Insufficient documentation

## 2022-09-29 DIAGNOSIS — Z87891 Personal history of nicotine dependence: Secondary | ICD-10-CM | POA: Insufficient documentation

## 2022-09-29 DIAGNOSIS — J449 Chronic obstructive pulmonary disease, unspecified: Secondary | ICD-10-CM | POA: Insufficient documentation

## 2022-09-29 DIAGNOSIS — G40909 Epilepsy, unspecified, not intractable, without status epilepticus: Secondary | ICD-10-CM | POA: Insufficient documentation

## 2022-09-29 DIAGNOSIS — S4992XA Unspecified injury of left shoulder and upper arm, initial encounter: Secondary | ICD-10-CM

## 2022-09-29 DIAGNOSIS — X58XXXA Exposure to other specified factors, initial encounter: Secondary | ICD-10-CM | POA: Insufficient documentation

## 2022-09-29 DIAGNOSIS — S63501A Unspecified sprain of right wrist, initial encounter: Secondary | ICD-10-CM | POA: Insufficient documentation

## 2022-09-29 LAB — CBC WITH DIFFERENTIAL/PLATELET
Abs Immature Granulocytes: 0.01 10*3/uL (ref 0.00–0.07)
Basophils Absolute: 0 10*3/uL (ref 0.0–0.1)
Basophils Relative: 0 %
Eosinophils Absolute: 0.2 10*3/uL (ref 0.0–0.5)
Eosinophils Relative: 3 %
HCT: 43.8 % (ref 39.0–52.0)
Hemoglobin: 14.4 g/dL (ref 13.0–17.0)
Immature Granulocytes: 0 %
Lymphocytes Relative: 32 %
Lymphs Abs: 2.8 10*3/uL (ref 0.7–4.0)
MCH: 31.8 pg (ref 26.0–34.0)
MCHC: 32.9 g/dL (ref 30.0–36.0)
MCV: 96.7 fL (ref 80.0–100.0)
Monocytes Absolute: 1 10*3/uL (ref 0.1–1.0)
Monocytes Relative: 11 %
Neutro Abs: 4.7 10*3/uL (ref 1.7–7.7)
Neutrophils Relative %: 54 %
Platelets: 316 10*3/uL (ref 150–400)
RBC: 4.53 MIL/uL (ref 4.22–5.81)
RDW: 13.1 % (ref 11.5–15.5)
WBC: 8.7 10*3/uL (ref 4.0–10.5)
nRBC: 0 % (ref 0.0–0.2)

## 2022-09-29 LAB — COMPREHENSIVE METABOLIC PANEL
ALT: 18 U/L (ref 0–44)
AST: 21 U/L (ref 15–41)
Albumin: 3.9 g/dL (ref 3.5–5.0)
Alkaline Phosphatase: 52 U/L (ref 38–126)
Anion gap: 8 (ref 5–15)
BUN: 8 mg/dL (ref 6–20)
CO2: 28 mmol/L (ref 22–32)
Calcium: 9.5 mg/dL (ref 8.9–10.3)
Chloride: 104 mmol/L (ref 98–111)
Creatinine, Ser: 1.05 mg/dL (ref 0.61–1.24)
GFR, Estimated: >60 mL/min (ref >60)
Glucose, Bld: 111 mg/dL — ABNORMAL HIGH (ref 70–99)
Potassium: 4.6 mmol/L (ref 3.5–5.1)
Sodium: 140 mmol/L (ref 135–145)
Total Bilirubin: 0.8 mg/dL (ref 0.3–1.2)
Total Protein: 7.1 g/dL (ref 6.5–8.1)

## 2022-09-29 MED ORDER — KETOROLAC TROMETHAMINE 30 MG/ML IJ SOLN
30.0000 mg | Freq: Once | INTRAMUSCULAR | Status: AC
  Administered 2022-09-29: 30 mg via INTRAMUSCULAR
  Filled 2022-09-29: qty 1

## 2022-09-29 MED ORDER — NAPROXEN 375 MG PO TABS: ORAL_TABLET | ORAL | 0 refills | Status: DC

## 2022-09-29 NOTE — ED Provider Triage Note (Signed)
Emergency Medicine Provider Triage Evaluation Note  Wayne Hawkins , a 51 y.o. male  was evaluated in triage.  Pt complains of head, neck, and left shoulder pain after a seizure on Sunday. He was seen in triage on Sunday at [facility], but left due to long wait times. He reports that he can't move his left shoulder over his head 2/2 pain.  Review of Systems  Positive:  Negative:   Physical Exam  BP 115/76 (BP Location: Right Arm)   Pulse 95   Temp 98.4 F (36.9 C) (Oral)   Resp 18   Ht 5\' 10"  (1.778 m)   Wt 68.9 kg   SpO2 91%   BMI 21.81 kg/m  Gen:   Awake, no distress   Resp:  Normal effort  MSK:   Moves extremities without difficulty  Other:  Palpable pulses, compartments soft of the left arm. Cranial nerves grossly intact.  Medical Decision Making  Medically screening exam initiated at 4:14 PM.  Appropriate orders placed.  [name] was informed that the remainder of the evaluation will be completed by another provider, this initial triage assessment does not replace that evaluation, and the importance of remaining in the ED until their evaluation is complete.  CT head, neck and labs ordered.    Sherrell Puller, PA-C 09/29/22 1627

## 2022-09-29 NOTE — Telephone Encounter (Signed)
RTC to patient states is in a lot of pain in his shoulders, knees and back.  Unable to walk .  Went to the ER on yesterday left due to the long wait.  Given an appointment in the Clinics for tomorrow at 3:15 PM.  Has since called EMS who have arrived.  To come over via Ambulance and be seen in the ER today.  Appointment for tomorrow has been cancelled.

## 2022-09-29 NOTE — ED Triage Notes (Signed)
Pt to er via ems, per ems pt is here for L shoulder pain, states that pt was seen recently for the same, states that he also reported a seizure yesterday.  Pt states that he has a hx of epilepsy, pt states that he is having a hard time remembering and "processing" pt states that he had a tbi when he was 4

## 2022-09-29 NOTE — Telephone Encounter (Signed)
Requesting to speak with a nurse about having shoulder, knee and arm pain. States he went to the ed yesterday, left without being seen. Would like to speak with a nurse before scheduling an appt. Please call pt back.

## 2022-09-29 NOTE — ED Provider Notes (Addendum)
[facility]-EMERGENCY DEPT Provider Note: [name], MD, FACEP  CSN: [id number] MRN: [id number] ARRIVAL: 09/29/22 at 1515 ROOM: WA18/WA18   CHIEF COMPLAINT  Shoulder Pain   HISTORY OF PRESENT ILLNESS  09/29/22 10:58 PM Wayne Hawkins is a 51 y.o. male with a seizure disorder since age 76.  His seizures have been increasing in frequency over the past several years.  He had a seizure about 3 days ago and injured his left shoulder.  He is now having severe shoulder pain, particularly with abduction and less so with internal rotation.  He attempted to be seen at [facility] but left due to prolonged wait.  He is requesting a shot of Toradol for the pain.  He denies other injury.  His cousin assists with his history due to cognitive deficits.   Past Medical History:  Diagnosis Date   Asthma    COPD (chronic obstructive pulmonary disease) (HCC)    Seizures (HCC)    TBI (traumatic brain injury) (HCC)    age 74    History reviewed. No pertinent surgical history.  Family History  Problem Relation Age of Onset   Hypertension Mother    Hypertension Father     Social History   Tobacco Use   Smoking status: Former    Packs/day: 1.00    Years: 16.00    Total pack years: 16.00    Types: Cigarettes    Quit date: 11/20/2016    Years since quitting: 5.8   Smokeless tobacco: Never   Tobacco comments:    quit 2 yrs ago   Vaping Use   Vaping Use: Former   Quit date: 05/20/2018   Substances: Nicotine, Mixture of cannabinoids  Substance Use Topics   Alcohol use: No   Drug use: Not Currently    Types: Marijuana    Comment: quit 2 yrs ago     Prior to Admission medications   Medication Sig Start Date End Date Taking? Authorizing Provider  naproxen (NAPROSYN) 375 MG tablet Take 1 tablet twice daily as needed for shoulder pain. 09/29/22  Yes Briyanna Billingham, Bradie, MD  albuterol (PROVENTIL HFA;VENTOLIN HFA) 108 (90 Base) MCG/ACT inhaler Inhale 2 puffs into the lungs every 6 (six) hours as  needed for wheezing or shortness of breath.  12/25/18   [provider]  budesonide-formoterol (SYMBICORT) 160-4.5 MCG/ACT inhaler Inhale 2 puffs into the lungs 2 (two) times daily.    [provider]  diazePAM (VALTOCO 10 MG DOSE) 10 MG/0.1ML LIQD Place 20 mg into the nose as needed for active seizure 07/02/22   Atway, Rayann N, DO  diphenhydrAMINE (BENADRYL) 25 MG tablet Take 50 mg by mouth at bedtime as needed for allergies.    [provider]  diphenhydramine-acetaminophen (TYLENOL PM) 25-500 MG TABS tablet Take 2 tablets by mouth at bedtime as needed (sleep).    [provider]  DULoxetine (CYMBALTA) 60 MG capsule Take 1 capsule (60 mg total) by mouth daily. 04/22/22   Dolan Amen, MD  gabapentin (NEURONTIN) 600 MG tablet Take 1 tablet (600 mg total) by mouth 3 (three) times daily. 04/22/22   Dolan Amen, MD  levETIRAcetam (KEPPRA) 500 MG tablet Take 1 tablet (500 mg total) by mouth 2 (two) times daily. 07/02/22   Chauncey Mann, DO    Allergies Patient has no known allergies.   REVIEW OF SYSTEMS  Negative except as noted here or in the History of Present Illness.   PHYSICAL EXAMINATION  Initial Vital Signs Blood pressure Marland Kitchen)  127/105, pulse 96, temperature 98.4 F (36.9 C), temperature source Oral, resp. rate 16, height 5\' 10"  (1.778 m), weight 68.9 kg, SpO2 96 %.  Examination General: Well-developed, well-nourished male in no acute distress; appearance consistent with age of record HENT: normocephalic; atraumatic Eyes: pupils equal, round and reactive to light; extraocular muscles intact Neck: supple Heart: regular rate and rhythm Lungs: clear to auscultation bilaterally Abdomen: soft; nondistended; nontender; bowel sounds present Extremities: No deformity; pain on attempted movement of left shoulder with limited abduction and internal rotation Neurologic: Awake, alert; motor function intact in all extremities and symmetric; no facial  droop Skin: Warm and dry Psychiatric: Normal mood and affect   RESULTS  Summary of this visit's results, reviewed and interpreted by myself:   EKG Interpretation  Date/Time:    Ventricular Rate:    PR Interval:    QRS Duration:   QT Interval:    QTC Calculation:   R Axis:     Text Interpretation:         Laboratory Studies: Results for orders placed or performed during the hospital encounter of 09/29/22 (from the past 24 hour(s))  CBC with Differential     Status: None   Collection Time: 09/29/22  5:33 PM  Result Value Ref Range   WBC 8.7 4.0 - 10.5 K/uL   RBC 4.53 4.22 - 5.81 MIL/uL   Hemoglobin 14.4 13.0 - 17.0 g/dL   HCT 43.8 39.0 - 52.0 %   MCV 96.7 80.0 - 100.0 fL   MCH 31.8 26.0 - 34.0 pg   MCHC 32.9 30.0 - 36.0 g/dL   RDW 13.1 11.5 - 15.5 %   Platelets 316 150 - 400 K/uL   nRBC 0.0 0.0 - 0.2 %   Neutrophils Relative % 54 %   Neutro Abs 4.7 1.7 - 7.7 K/uL   Lymphocytes Relative 32 %   Lymphs Abs 2.8 0.7 - 4.0 K/uL   Monocytes Relative 11 %   Monocytes Absolute 1.0 0.1 - 1.0 K/uL   Eosinophils Relative 3 %   Eosinophils Absolute 0.2 0.0 - 0.5 K/uL   Basophils Relative 0 %   Basophils Absolute 0.0 0.0 - 0.1 K/uL   Immature Granulocytes 0 %   Abs Immature Granulocytes 0.01 0.00 - 0.07 K/uL  Comprehensive metabolic panel     Status: Abnormal   Collection Time: 09/29/22  5:33 PM  Result Value Ref Range   Sodium 140 135 - 145 mmol/L   Potassium 4.6 3.5 - 5.1 mmol/L   Chloride 104 98 - 111 mmol/L   CO2 28 22 - 32 mmol/L   Glucose, Bld 111 (H) 70 - 99 mg/dL   BUN 8 6 - 20 mg/dL   Creatinine, Ser 1.05 0.61 - 1.24 mg/dL   Calcium 9.5 8.9 - 10.3 mg/dL   Total Protein 7.1 6.5 - 8.1 g/dL   Albumin 3.9 3.5 - 5.0 g/dL   AST 21 15 - 41 U/L   ALT 18 0 - 44 U/L   Alkaline Phosphatase 52 38 - 126 U/L   Total Bilirubin 0.8 0.3 - 1.2 mg/dL   GFR, Estimated >60 >60 mL/min   Anion gap 8 5 - 15   Imaging Studies: CT Head Wo Contrast  Result Date:  09/29/2022 CLINICAL DATA:  Seizure trauma EXAM: CT HEAD WITHOUT CONTRAST CT CERVICAL SPINE WITHOUT CONTRAST TECHNIQUE: Multidetector CT imaging of the head and cervical spine was performed following the standard protocol without intravenous contrast. Multiplanar CT image reconstructions of the cervical  spine were also generated. RADIATION DOSE REDUCTION: This exam was performed according to the departmental dose-optimization program which includes automated exposure control, adjustment of the mA and/or kV according to patient size and/or use of iterative reconstruction technique. COMPARISON:  CT brain 02/26/2022, CT brain and cervical spine 07/18/2020 FINDINGS: CT HEAD FINDINGS Brain: No evidence of acute infarction, hemorrhage, hydrocephalus, extra-axial collection or mass lesion/mass effect. Vascular: No hyperdense vessel or unexpected calcification. Skull: Normal. Negative for fracture or focal lesion. Sinuses/Orbits: No acute finding. Other: None CT CERVICAL SPINE FINDINGS Alignment: Normal. Skull base and vertebrae: No acute fracture. No primary bone lesion or focal pathologic process. Soft tissues and spinal canal: No prevertebral fluid or swelling. No visible canal hematoma. Disc levels:  Mild degenerative change C6-C7. Upper chest: Negative. Other: None IMPRESSION: 1. Negative non contrasted CT of the head. 2. Negative CT of the cervical spine. Electronically Signed   By: Jasmine Pang M.D.   On: 09/29/2022 18:58   CT Cervical Spine Wo Contrast  Result Date: 09/29/2022 CLINICAL DATA:  Seizure trauma EXAM: CT HEAD WITHOUT CONTRAST CT CERVICAL SPINE WITHOUT CONTRAST TECHNIQUE: Multidetector CT imaging of the head and cervical spine was performed following the standard protocol without intravenous contrast. Multiplanar CT image reconstructions of the cervical spine were also generated. RADIATION DOSE REDUCTION: This exam was performed according to the departmental dose-optimization program which includes  automated exposure control, adjustment of the mA and/or kV according to patient size and/or use of iterative reconstruction technique. COMPARISON:  CT brain 02/26/2022, CT brain and cervical spine 07/18/2020 FINDINGS: CT HEAD FINDINGS Brain: No evidence of acute infarction, hemorrhage, hydrocephalus, extra-axial collection or mass lesion/mass effect. Vascular: No hyperdense vessel or unexpected calcification. Skull: Normal. Negative for fracture or focal lesion. Sinuses/Orbits: No acute finding. Other: None CT CERVICAL SPINE FINDINGS Alignment: Normal. Skull base and vertebrae: No acute fracture. No primary bone lesion or focal pathologic process. Soft tissues and spinal canal: No prevertebral fluid or swelling. No visible canal hematoma. Disc levels:  Mild degenerative change C6-C7. Upper chest: Negative. Other: None IMPRESSION: 1. Negative non contrasted CT of the head. 2. Negative CT of the cervical spine. Electronically Signed   By: Jasmine Pang M.D.   On: 09/29/2022 18:58   DG Shoulder Left  Result Date: 09/29/2022 CLINICAL DATA:  Head, neck and left shoulder pain after seizure. EXAM: LEFT SHOULDER - 2+ VIEW COMPARISON:  09/27/2022 FINDINGS: Left shoulder is located without a fracture. Mild degenerative changes at the left [facility] joint. Visualized left ribs appear to be intact. Probable scarring at the left lung apex. IMPRESSION: 1. No acute bone abnormality to left shoulder. Electronically Signed   By: Richarda Overlie M.D.   On: 09/29/2022 16:58    ED COURSE and MDM  Nursing notes, initial and subsequent vitals signs, including pulse oximetry, reviewed and interpreted by myself.  Vitals:   09/29/22 1519 09/29/22 1521 09/29/22 2217  BP: 115/76  (!) 127/105  Pulse: 95  96  Resp: 18  16  Temp: 98.4 F (36.9 C)    TempSrc: Oral    SpO2: 91%  96%  Weight:  68.9 kg   Height:  5\' 10"  (1.778 m)    Medications  ketorolac (TORADOL) 30 MG/ML injection 30 mg (has no administration in time range)    The  patient is followed by Dr. of neurology.  Keppra level is pending.  His shoulder films are negative for fracture or dislocation.  He could have a rotator cuff tear or  he could have had a dislocation as a result of the seizure which subsequently reduced itself.  11:22 PM The patient forgot to mention to me that he also injured his right wrist.  He is having pain on the dorsal right wrist.  There is tenderness of the extensor tendon but no bony point tenderness.  I suspect this represents a mild sprain and we will place in a wrist splint.  PROCEDURES  Procedures   ED DIAGNOSES     ICD-10-CM   1. Injury of left shoulder, initial encounter  S49.92XA     2. Seizure disorder (HCC)  G40.909     3. Sprain of right wrist, initial encounter  S63.501A          Paula Libra, MD 09/29/22 2317    Paula Libra, MD 09/29/22 [phone]

## 2022-09-30 ENCOUNTER — Encounter: Payer: Self-pay | Admitting: Student

## 2022-10-01 LAB — LEVETIRACETAM LEVEL: Levetiracetam Lvl: <2 ug/mL — ABNORMAL LOW (ref 10.0–40.0)

## 2022-10-16 IMAGING — DX DG HIP (WITH OR WITHOUT PELVIS) 2-3V*R*
3 series · 3 of 3 positions shown · non-contrast
Comparison: None Available.

CLINICAL DATA: Right hip pain.

EXAM:
DG HIP (WITH OR WITHOUT PELVIS) 2-3V RIGHT

[pelvis ap]
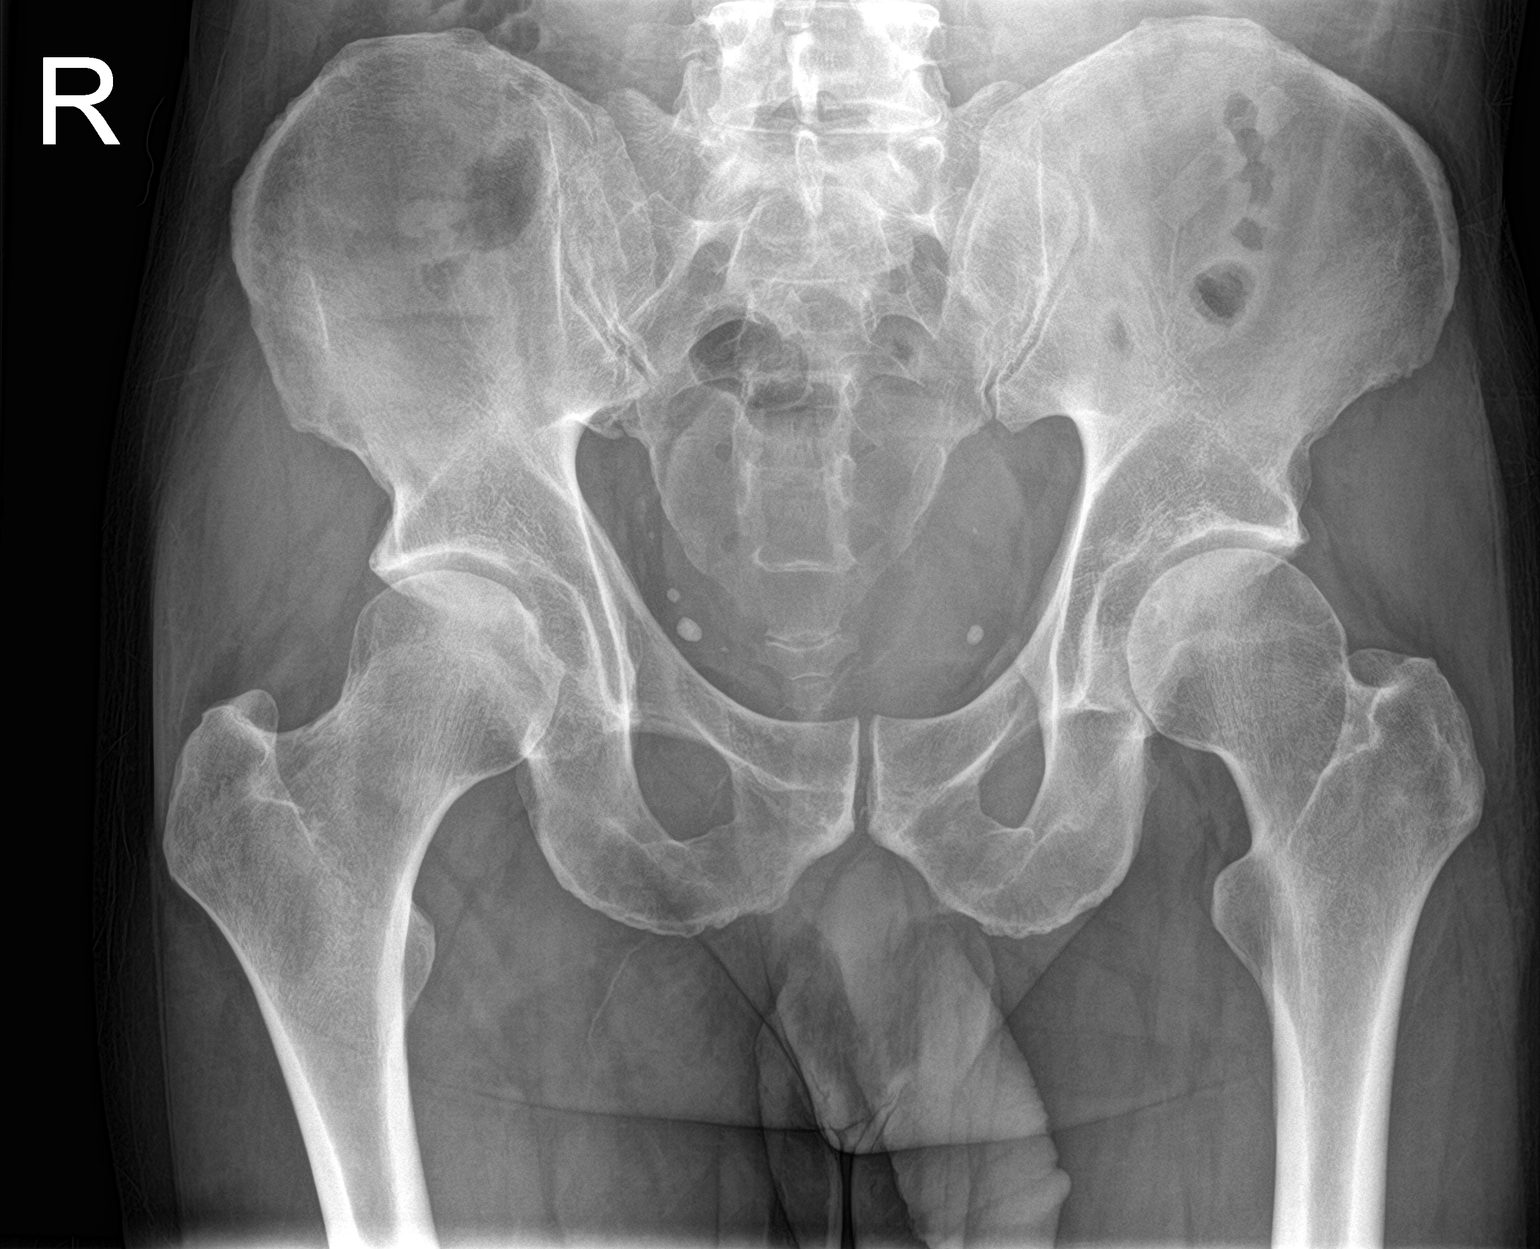

[hip ap]
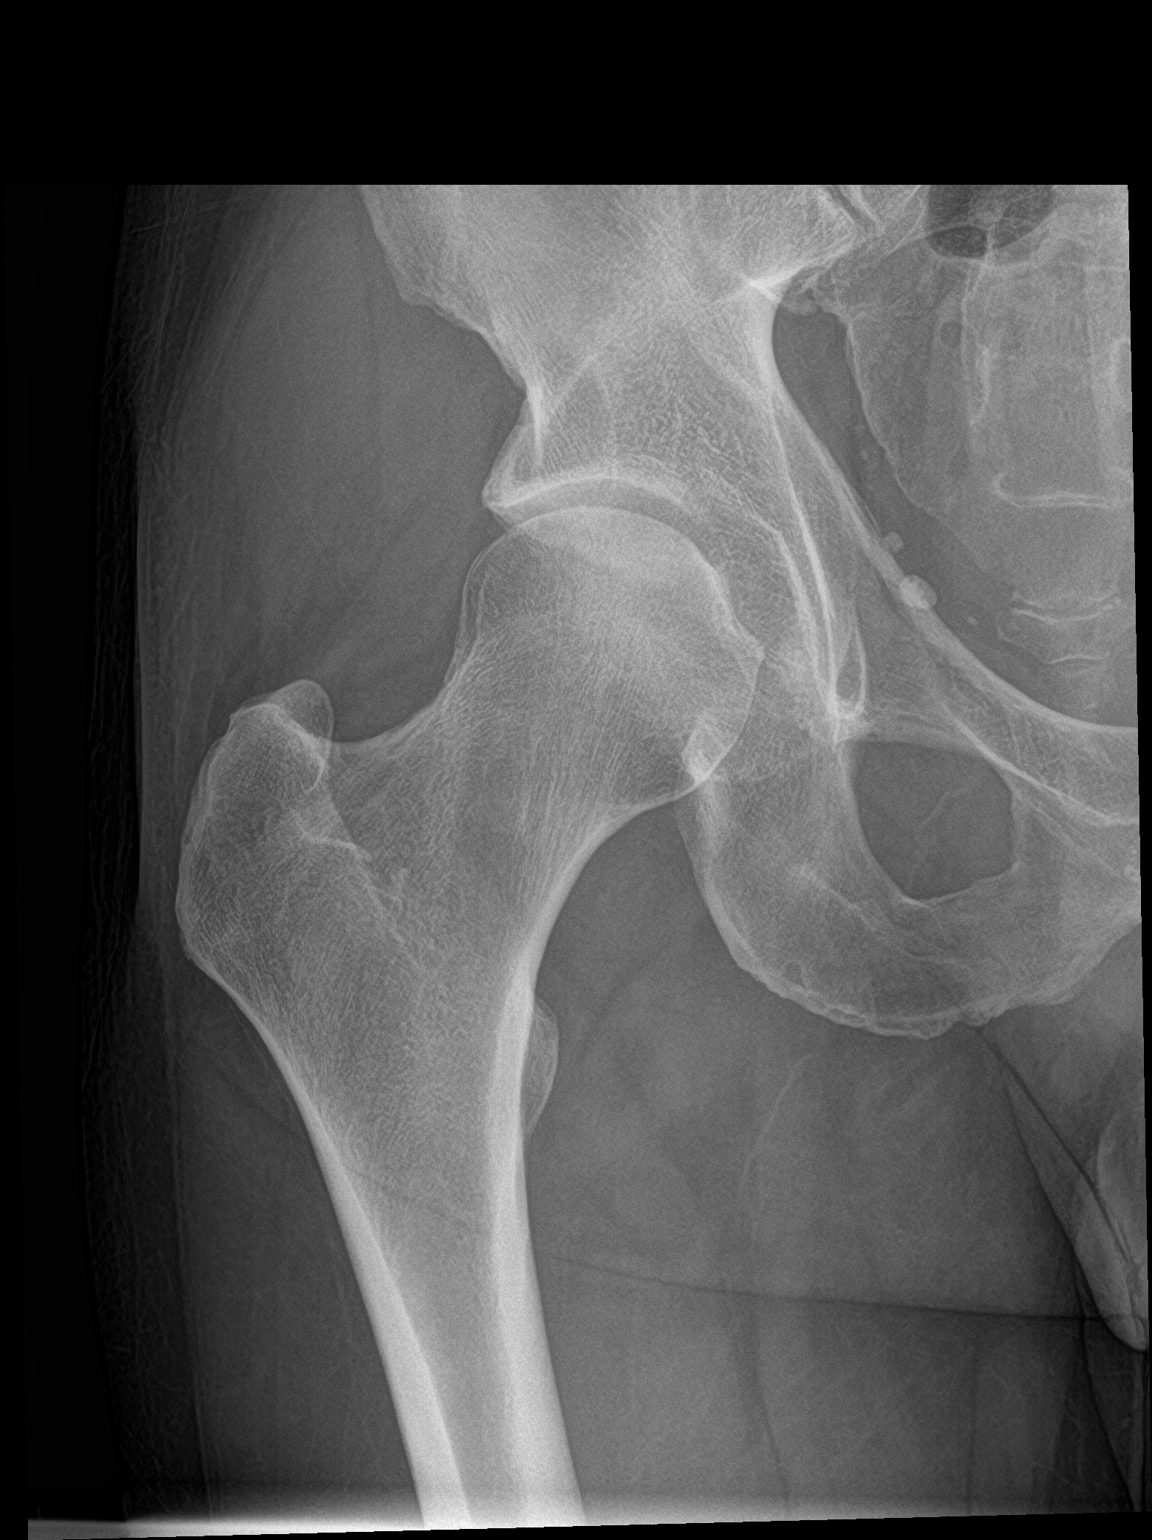

[hip lat]
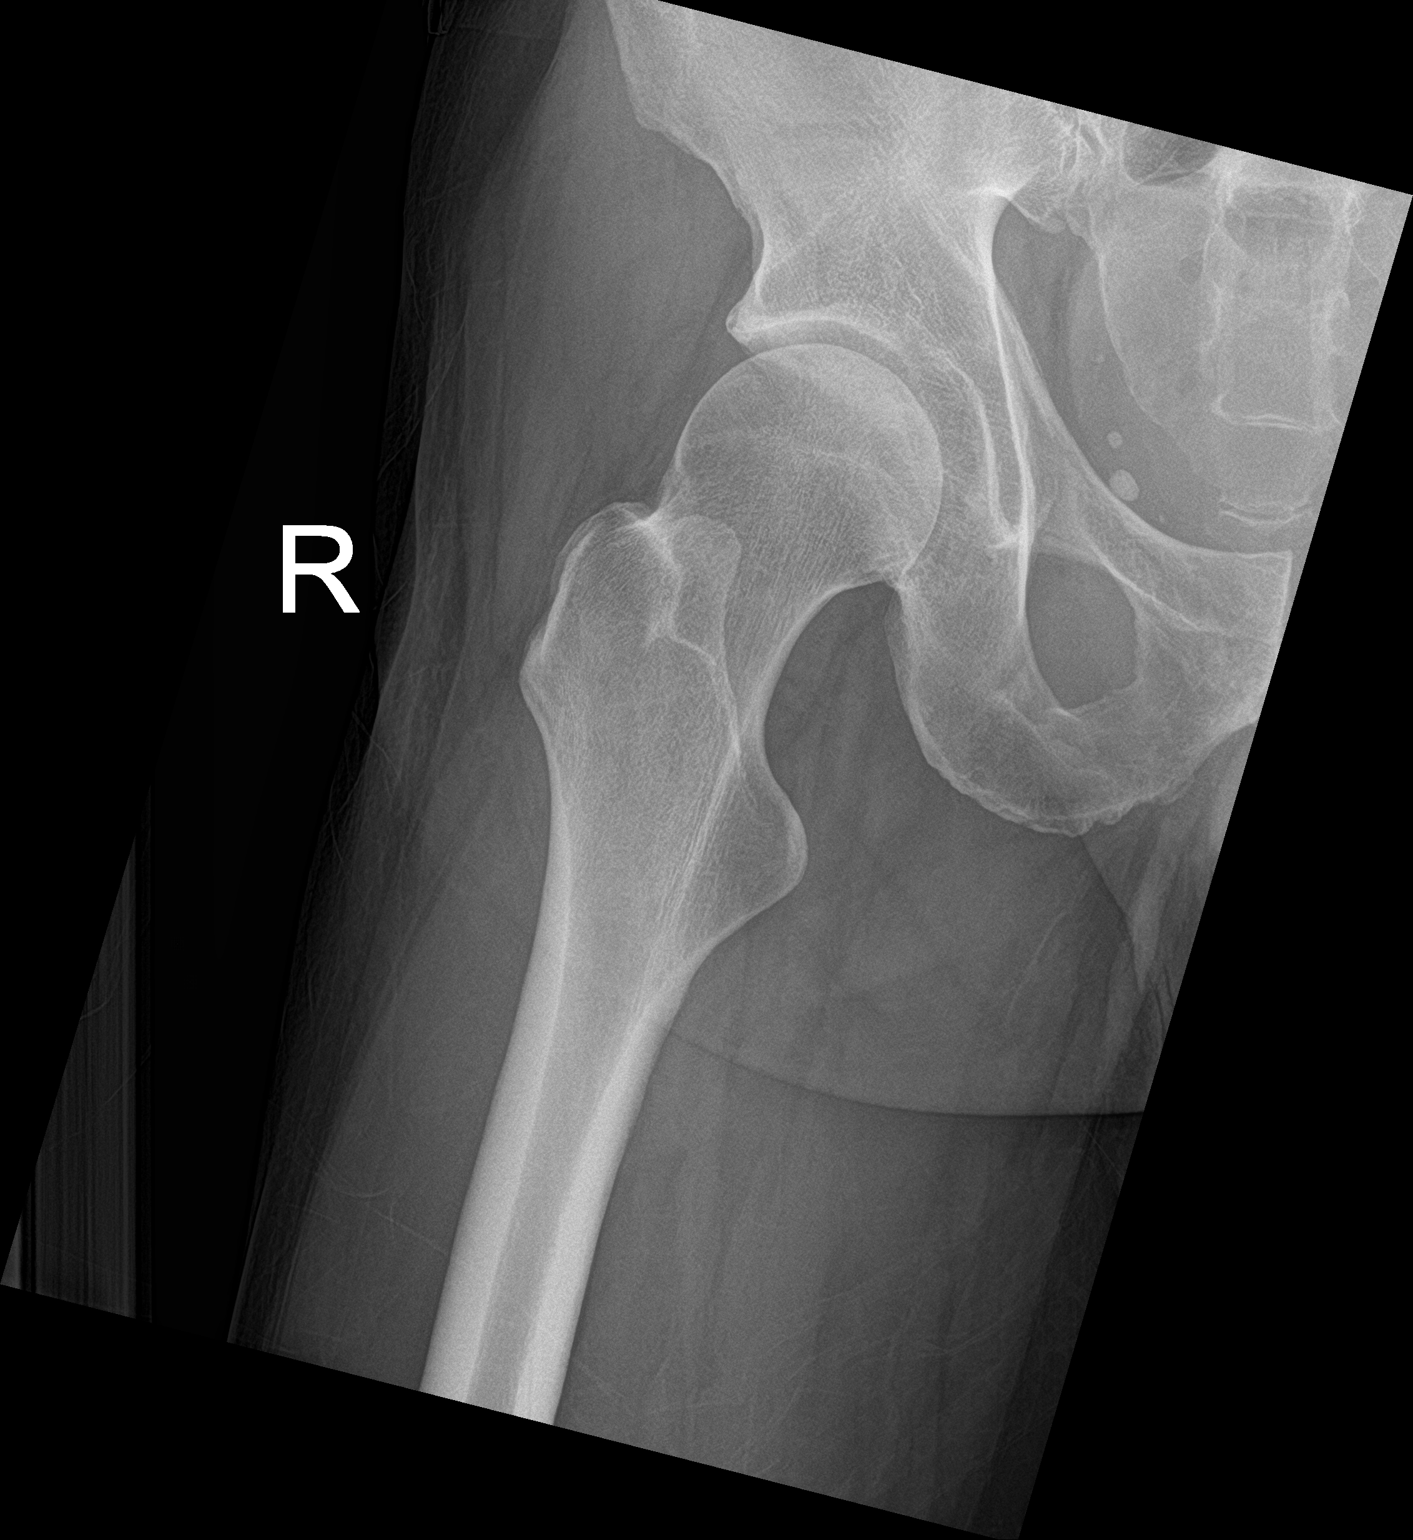

[3 of 3 positions shown; findings below may reference images not displayed]

FINDINGS: There is no evidence of hip fracture or dislocation. Mild
degenerative changes are noted at the hips bilaterally.
IMPRESSION: Mild degenerative changes at the hips bilaterally.

## 2022-11-11 ENCOUNTER — Encounter (HOSPITAL_COMMUNITY): Payer: Self-pay

## 2022-11-11 ENCOUNTER — Emergency Department (HOSPITAL_COMMUNITY)
Admission: EM | Admit: 2022-11-11 | Discharge: 2022-11-11 | Discharge status: Against Medical Advice | Payer: Self-pay | Attending: Emergency Medicine | Admitting: Emergency Medicine

## 2022-11-11 ENCOUNTER — Other Ambulatory Visit: Payer: Self-pay

## 2022-11-11 DIAGNOSIS — G40909 Epilepsy, unspecified, not intractable, without status epilepticus: Secondary | ICD-10-CM | POA: Insufficient documentation

## 2022-11-11 DIAGNOSIS — M25511 Pain in right shoulder: Secondary | ICD-10-CM | POA: Insufficient documentation

## 2022-11-11 DIAGNOSIS — Z5321 Procedure and treatment not carried out due to patient leaving prior to being seen by health care provider: Secondary | ICD-10-CM | POA: Insufficient documentation

## 2022-11-11 NOTE — ED Notes (Signed)
Called for vitals. No response 

## 2022-11-11 NOTE — ED Triage Notes (Signed)
Pt reports waking up with right shoulder pain, hx of same, seen here twice recently. States he has epilepsy so he doesn't know if he injured it while sleeping.

## 2022-11-19 ENCOUNTER — Inpatient Hospital Stay (HOSPITAL_COMMUNITY)
Admission: EM | Admit: 2022-11-19 | Discharge: 2022-11-22 | DRG: 101 | Disposition: A | Discharge status: Home / Self Care | Payer: Medicaid Other | Attending: Family Medicine | Admitting: Family Medicine

## 2022-11-19 ENCOUNTER — Observation Stay (HOSPITAL_COMMUNITY): Payer: Medicaid Other

## 2022-11-19 ENCOUNTER — Emergency Department (HOSPITAL_COMMUNITY): Payer: Medicaid Other

## 2022-11-19 ENCOUNTER — Other Ambulatory Visit: Payer: Self-pay

## 2022-11-19 DIAGNOSIS — M47816 Spondylosis without myelopathy or radiculopathy, lumbar region: Secondary | ICD-10-CM | POA: Diagnosis present

## 2022-11-19 DIAGNOSIS — G40909 Epilepsy, unspecified, not intractable, without status epilepticus: Secondary | ICD-10-CM | POA: Diagnosis not present

## 2022-11-19 DIAGNOSIS — R262 Difficulty in walking, not elsewhere classified: Secondary | ICD-10-CM | POA: Diagnosis present

## 2022-11-19 DIAGNOSIS — J209 Acute bronchitis, unspecified: Secondary | ICD-10-CM | POA: Diagnosis present

## 2022-11-19 DIAGNOSIS — R569 Unspecified convulsions: Secondary | ICD-10-CM

## 2022-11-19 DIAGNOSIS — M545 Low back pain, unspecified: Secondary | ICD-10-CM | POA: Diagnosis present

## 2022-11-19 DIAGNOSIS — S00512A Abrasion of oral cavity, initial encounter: Secondary | ICD-10-CM | POA: Diagnosis present

## 2022-11-19 DIAGNOSIS — M48061 Spinal stenosis, lumbar region without neurogenic claudication: Secondary | ICD-10-CM | POA: Diagnosis present

## 2022-11-19 DIAGNOSIS — Z79899 Other long term (current) drug therapy: Secondary | ICD-10-CM

## 2022-11-19 DIAGNOSIS — M7501 Adhesive capsulitis of right shoulder: Secondary | ICD-10-CM | POA: Diagnosis present

## 2022-11-19 DIAGNOSIS — M79675 Pain in left toe(s): Secondary | ICD-10-CM | POA: Diagnosis present

## 2022-11-19 DIAGNOSIS — J440 Chronic obstructive pulmonary disease with acute lower respiratory infection: Secondary | ICD-10-CM | POA: Diagnosis present

## 2022-11-19 DIAGNOSIS — Z87891 Personal history of nicotine dependence: Secondary | ICD-10-CM

## 2022-11-19 DIAGNOSIS — D72828 Other elevated white blood cell count: Secondary | ICD-10-CM | POA: Diagnosis not present

## 2022-11-19 DIAGNOSIS — Z8249 Family history of ischemic heart disease and other diseases of the circulatory system: Secondary | ICD-10-CM

## 2022-11-19 DIAGNOSIS — Z7951 Long term (current) use of inhaled steroids: Secondary | ICD-10-CM

## 2022-11-19 DIAGNOSIS — T426X6A Underdosing of other antiepileptic and sedative-hypnotic drugs, initial encounter: Secondary | ICD-10-CM | POA: Diagnosis present

## 2022-11-19 DIAGNOSIS — G8929 Other chronic pain: Secondary | ICD-10-CM | POA: Diagnosis present

## 2022-11-19 DIAGNOSIS — R531 Weakness: Secondary | ICD-10-CM | POA: Diagnosis present

## 2022-11-19 DIAGNOSIS — M5416 Radiculopathy, lumbar region: Secondary | ICD-10-CM | POA: Diagnosis present

## 2022-11-19 DIAGNOSIS — Z91128 Patient's intentional underdosing of medication regimen for other reason: Secondary | ICD-10-CM

## 2022-11-19 DIAGNOSIS — T380X5A Adverse effect of glucocorticoids and synthetic analogues, initial encounter: Secondary | ICD-10-CM | POA: Diagnosis not present

## 2022-11-19 DIAGNOSIS — Z87820 Personal history of traumatic brain injury: Secondary | ICD-10-CM

## 2022-11-19 DIAGNOSIS — R451 Restlessness and agitation: Secondary | ICD-10-CM | POA: Diagnosis present

## 2022-11-19 LAB — CBC WITH DIFFERENTIAL/PLATELET
Abs Immature Granulocytes: 0.01 10*3/uL (ref 0.00–0.07)
Basophils Absolute: 0 10*3/uL (ref 0.0–0.1)
Basophils Relative: 1 %
Eosinophils Absolute: 0.6 10*3/uL — ABNORMAL HIGH (ref 0.0–0.5)
Eosinophils Relative: 8 %
HCT: 44.9 % (ref 39.0–52.0)
Hemoglobin: 14.9 g/dL (ref 13.0–17.0)
Immature Granulocytes: 0 %
Lymphocytes Relative: 40 %
Lymphs Abs: 3 10*3/uL (ref 0.7–4.0)
MCH: 31.4 pg (ref 26.0–34.0)
MCHC: 33.2 g/dL (ref 30.0–36.0)
MCV: 94.5 fL (ref 80.0–100.0)
Monocytes Absolute: 0.6 10*3/uL (ref 0.1–1.0)
Monocytes Relative: 8 %
Neutro Abs: 3.3 10*3/uL (ref 1.7–7.7)
Neutrophils Relative %: 43 %
Platelets: 316 10*3/uL (ref 150–400)
RBC: 4.75 MIL/uL (ref 4.22–5.81)
RDW: 13.3 % (ref 11.5–15.5)
WBC: 7.6 10*3/uL (ref 4.0–10.5)
nRBC: 0 % (ref 0.0–0.2)

## 2022-11-19 LAB — COMPREHENSIVE METABOLIC PANEL
ALT: 13 U/L (ref 0–44)
AST: 15 U/L (ref 15–41)
Albumin: 4 g/dL (ref 3.5–5.0)
Alkaline Phosphatase: 49 U/L (ref 38–126)
Anion gap: 7 (ref 5–15)
BUN: 7 mg/dL (ref 6–20)
CO2: 24 mmol/L (ref 22–32)
Calcium: 8.9 mg/dL (ref 8.9–10.3)
Chloride: 107 mmol/L (ref 98–111)
Creatinine, Ser: 0.96 mg/dL (ref 0.61–1.24)
GFR, Estimated: >60 mL/min (ref >60)
Glucose, Bld: 95 mg/dL (ref 70–99)
Potassium: 4.2 mmol/L (ref 3.5–5.1)
Sodium: 138 mmol/L (ref 135–145)
Total Bilirubin: 0.6 mg/dL (ref 0.3–1.2)
Total Protein: 7.2 g/dL (ref 6.5–8.1)

## 2022-11-19 LAB — TROPONIN I (HIGH SENSITIVITY)
Troponin I (High Sensitivity): <2 ng/L (ref <18)
Troponin I (High Sensitivity): <2 ng/L (ref <18)

## 2022-11-19 LAB — ETHANOL: Alcohol, Ethyl (B): <10 mg/dL (ref <10)

## 2022-11-19 MED ORDER — GABAPENTIN 300 MG PO CAPS
600.0000 mg | ORAL_CAPSULE | Freq: Three times a day (TID) | ORAL | Status: DC
  Administered 2022-11-20 – 2022-11-22 (×6): 600 mg via ORAL
  Filled 2022-11-19 (×7): qty 2

## 2022-11-19 MED ORDER — ENOXAPARIN SODIUM 40 MG/0.4ML IJ SOSY
40.0000 mg | PREFILLED_SYRINGE | INTRAMUSCULAR | Status: DC
  Administered 2022-11-20 – 2022-11-21 (×2): 40 mg via SUBCUTANEOUS
  Filled 2022-11-19 (×2): qty 0.4

## 2022-11-19 MED ORDER — ONDANSETRON HCL 4 MG/2ML IJ SOLN: 4.0000 mg | Freq: Four times a day (QID) | INTRAMUSCULAR | Status: DC | PRN

## 2022-11-19 MED ORDER — ORAL CARE MOUTH RINSE: 15.0000 mL | OROMUCOSAL | Status: DC

## 2022-11-19 MED ORDER — ACETAMINOPHEN 325 MG PO TABS
650.0000 mg | ORAL_TABLET | ORAL | Status: DC | PRN
  Administered 2022-11-20 – 2022-11-21 (×2): 650 mg via ORAL
  Filled 2022-11-19 (×2): qty 2

## 2022-11-19 MED ORDER — MOMETASONE FURO-FORMOTEROL FUM 200-5 MCG/ACT IN AERO
2.0000 | INHALATION_SPRAY | Freq: Two times a day (BID) | RESPIRATORY_TRACT | Status: DC
  Filled 2022-11-19: qty 8.8

## 2022-11-19 MED ORDER — OXYCODONE-ACETAMINOPHEN 5-325 MG PO TABS
2.0000 | ORAL_TABLET | Freq: Once | ORAL | Status: AC
  Administered 2022-11-19: 2 via ORAL
  Filled 2022-11-19: qty 2

## 2022-11-19 MED ORDER — LEVETIRACETAM 500 MG PO TABS
500.0000 mg | ORAL_TABLET | Freq: Two times a day (BID) | ORAL | Status: DC
  Administered 2022-11-19 – 2022-11-22 (×6): 500 mg via ORAL
  Filled 2022-11-19 (×5): qty 1

## 2022-11-19 MED ORDER — ACETAMINOPHEN 650 MG RE SUPP: 650.0000 mg | RECTAL | Status: DC | PRN

## 2022-11-19 MED ORDER — ALBUTEROL SULFATE (2.5 MG/3ML) 0.083% IN NEBU: 3.0000 mL | INHALATION_SOLUTION | Freq: Four times a day (QID) | RESPIRATORY_TRACT | Status: DC | PRN

## 2022-11-19 MED ORDER — LEVETIRACETAM IN NACL 1000 MG/100ML IV SOLN
1000.0000 mg | Freq: Once | INTRAVENOUS | Status: AC
  Administered 2022-11-19: 1000 mg via INTRAVENOUS
  Filled 2022-11-19: qty 100

## 2022-11-19 MED ORDER — IOHEXOL 300 MG/ML  SOLN
100.0000 mL | Freq: Once | INTRAMUSCULAR | Status: AC | PRN
  Administered 2022-11-19: 100 mL via INTRAVENOUS

## 2022-11-19 MED ORDER — NAPROXEN 375 MG PO TABS: 375.0000 mg | ORAL_TABLET | Freq: Two times a day (BID) | ORAL | Status: DC | PRN

## 2022-11-19 MED ORDER — DULOXETINE HCL 30 MG PO CPEP
60.0000 mg | ORAL_CAPSULE | Freq: Every day | ORAL | Status: DC
  Administered 2022-11-21 – 2022-11-22 (×2): 60 mg via ORAL
  Filled 2022-11-19 (×2): qty 2

## 2022-11-19 MED ORDER — SENNOSIDES-DOCUSATE SODIUM 8.6-50 MG PO TABS
1.0000 | ORAL_TABLET | Freq: Every evening | ORAL | Status: DC | PRN
  Administered 2022-11-21 (×2): 1 via ORAL
  Filled 2022-11-19 (×2): qty 1

## 2022-11-19 MED ORDER — ORAL CARE MOUTH RINSE: 15.0000 mL | OROMUCOSAL | Status: DC | PRN

## 2022-11-19 MED ORDER — LORAZEPAM 2 MG/ML IJ SOLN
1.0000 mg | Freq: Once | INTRAMUSCULAR | Status: AC
  Administered 2022-11-19: 1 mg via INTRAVENOUS
  Filled 2022-11-19: qty 1

## 2022-11-19 MED ORDER — LORAZEPAM 2 MG/ML IJ SOLN: 2.0000 mg | INTRAMUSCULAR | Status: DC | PRN

## 2022-11-19 MED ORDER — ONDANSETRON HCL 4 MG PO TABS
4.0000 mg | ORAL_TABLET | Freq: Four times a day (QID) | ORAL | Status: DC | PRN
  Administered 2022-11-21: 4 mg via ORAL
  Filled 2022-11-19: qty 1

## 2022-11-19 NOTE — ED Notes (Addendum)
Writer in to speak to pt as requested by him. Dr Manus Gunning trying to examine pt as he is voicing concern about an event in X-ray. Friend has arrived trying to calm pt. Pt with agitation meds have been ordered. Friend has been given Patient Experience paper with number to express concerns.

## 2022-11-19 NOTE — ED Notes (Signed)
Rechecked pt who is still anxious, he is slowly improving with his agitation as his fiend is present.

## 2022-11-19 NOTE — Assessment & Plan Note (Signed)
Fall at home Patient reports fall at home during seizure activity.  Extensive traumatic imaging workup is reassuring.  Patient has known mild-moderate lumbar spinal stenosis with spondylosis.  Patient reports significant left lower lumbar/hip pain occurring after he was transferred to CT table.  Follow-up CT L-spine was negative for acute lumbar spine fracture, stable lower lumbar spondylosis noted. -Follow MRI lumbar spine -Continue home gabapentin -PT/OT eval, fall precautions

## 2022-11-19 NOTE — ED Notes (Signed)
Pt will not ambulate. Stated "They dropped me earlier and I have stenosis of the spine" So he refuses to complete the ambulation challenge due to having pain in his hip.

## 2022-11-19 NOTE — Hospital Course (Signed)
Wayne Hawkins is a 51 y.o. male with medical history significant for COPD, seizure disorder, lumbar spinal stenosis with radiculopathy, reported TBI age 56 who is admitted with seizure activity.

## 2022-11-19 NOTE — ED Provider Notes (Signed)
[facility]-EMERGENCY DEPT Provider Note   CSN: [id number] Arrival date & time: 11/19/22  1434     History  Chief Complaint  Patient presents with   Seizures    Antero Derosia is a 51 y.o. male.  Patient presents after suspected seizure.  States he fell at home today when attempting to stand and he hit his head on the ground as well as his shoulder.  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 Believes he had a seizure and bit his tongue.  Does have a history of seizure disorder and takes Keppra.  Did not take his dose today and states he misses it about twice weekly.  Complains of pain to his right shoulder and right hip.  Does have a history of frozen shoulder on the right side.  Did hit his head but did not know if he lost consciousness.  No blood thinner use.  No chest pain or shortness of breath.  No fever or recent illness.  No cough, runny nose, sore throat, chest pain or shortness of breath.  No pain with urination or blood in the urine.  Complains of pain to right hip and right shoulder.  States he attempted to stand multiple times today and was weak in his legs that he fell to the ground.  Somewhat tangential and argumentative during history and exam.  The history is provided by the patient and the EMS personnel.  Seizures      Home Medications Prior to Admission medications   Medication Sig Start Date End Date Taking? Authorizing Provider  albuterol (PROVENTIL HFA;VENTOLIN HFA) 108 (90 Base) MCG/ACT inhaler Inhale 2 puffs into the lungs every 6 (six) hours as needed for wheezing or shortness of breath.  12/25/18   [provider]  budesonide-formoterol (SYMBICORT) 160-4.5 MCG/ACT inhaler Inhale 2 puffs into the lungs 2 (two) times daily.    [provider]  diazePAM (VALTOCO 10 MG DOSE) 10 MG/0.1ML LIQD Place 20 mg into the nose as needed for active seizure 07/02/22   Atway, Rayann N, DO  diphenhydrAMINE  (BENADRYL) 25 MG tablet Take 50 mg by mouth at bedtime as needed for allergies.    [provider]  diphenhydramine-acetaminophen (TYLENOL PM) 25-500 MG TABS tablet Take 2 tablets by mouth at bedtime as needed (sleep).    [provider]  DULoxetine (CYMBALTA) 60 MG capsule Take 1 capsule (60 mg total) by mouth daily. 04/22/22   Dolan Amen, MD  gabapentin (NEURONTIN) 600 MG tablet Take 1 tablet (600 mg total) by mouth 3 (three) times daily. 04/22/22   Dolan Amen, MD  levETIRAcetam (KEPPRA) 500 MG tablet Take 1 tablet (500 mg total) by mouth 2 (two) times daily. 07/02/22   Atway, Rayann N, DO  naproxen (NAPROSYN) 375 MG tablet Take 1 tablet twice daily as needed for shoulder pain. 09/29/22   Molpus, Jonny Ruiz, MD      Allergies    Patient has no known allergies.    Review of Systems   Review of Systems  Constitutional:  Negative for activity change, appetite change and fever.  HENT:  Negative for congestion.   Respiratory:  Negative for cough, chest tightness, shortness of breath and wheezing.   Gastrointestinal:  Negative for abdominal pain, nausea and vomiting.  Genitourinary:  Negative for dysuria and hematuria.  Musculoskeletal:  Positive for arthralgias and myalgias.  Skin:  Negative for rash.  Neurological:  Positive for seizures and headaches.   all other systems are  negative except as noted in the HPI and PMH.    Physical Exam Updated Vital Signs BP (!) 126/92 (BP Location: Right Arm)   Pulse 72   Temp 97.9 F (36.6 C) (Oral)   Resp 18   Ht 5\' 11"  (1.803 m)   Wt 67.6 kg   SpO2 94%   BMI 20.78 kg/m  Physical Exam Vitals and nursing note reviewed.  Constitutional:      General: He is not in acute distress.    Appearance: He is well-developed. He is not ill-appearing.     Comments: Oriented to person and place Somewhat tangential, argumentative, irritated with being asked questions  HENT:     Head: Normocephalic and atraumatic.     Mouth/Throat:      Pharynx: No oropharyngeal exudate.     Comments: Tongue abrasion Eyes:     Conjunctiva/sclera: Conjunctivae normal.     Pupils: Pupils are equal, round, and reactive to light.  Neck:     Comments: No midline C-spine tenderness Cardiovascular:     Rate and Rhythm: Normal rate and regular rhythm.     Heart sounds: Normal heart sounds. No murmur heard. Pulmonary:     Effort: Pulmonary effort is normal. No respiratory distress.     Breath sounds: Normal breath sounds.  Chest:     Chest wall: No tenderness.  Abdominal:     Palpations: Abdomen is soft.     Tenderness: There is no abdominal tenderness. There is no guarding or rebound.  Musculoskeletal:        General: No tenderness. Normal range of motion.     Cervical back: Normal range of motion and neck supple.     Comments: Able to range shoulder with some discomfort.  No deformity.  Able to touch opposite shoulder.  Tenderness to palpation of right anterior and lateral hip without shortening or external rotation.  Intact distal pulses  Skin:    General: Skin is warm.  Neurological:     Mental Status: He is alert and oriented to person, place, and time.     Cranial Nerves: No cranial nerve deficit.     Motor: No abnormal muscle tone.     Coordination: Coordination normal.     Comments: No ataxia on finger to nose bilaterally. No pronator drift.  Subtle decrease strength in the left leg with ankle flexion and extension.  Some weakness raising left leg off the bed.  5/5 strength of upper extremities bilaterally.  5/5 strength of right leg able to lift off the bed without difficulty.  Psychiatric:        Behavior: Behavior normal.     ED Results / Procedures / Treatments   Labs (all labs ordered are listed, but only abnormal results are displayed) Labs Reviewed  CBC WITH DIFFERENTIAL/PLATELET - Abnormal; Notable for the following components:      Result Value   Eosinophils Absolute 0.6 (*)    All other components within  normal limits  COMPREHENSIVE METABOLIC PANEL  ETHANOL  URINALYSIS, ROUTINE W REFLEX MICROSCOPIC  RAPID URINE DRUG SCREEN, HOSP PERFORMED  HIV ANTIBODY (ROUTINE TESTING W REFLEX)  LEVETIRACETAM LEVEL  TROPONIN I (HIGH SENSITIVITY)  TROPONIN I (HIGH SENSITIVITY)    EKG EKG Interpretation  Date/Time:  Thursday November 19 2022 15:54:56 EST Ventricular Rate:  71 PR Interval:  150 QRS Duration: 72 QT Interval:  387 QTC Calculation: 421 R Axis:   74 Text Interpretation: Sinus rhythm ST elev, probable normal early repol pattern Baseline wander  in lead(s) V1 No significant change was found Confirmed by Glynn Octave [phone]) on 11/19/2022 4:17:51 PM  Radiology MR BRAIN WO CONTRAST  Result Date: 11/20/2022 CLINICAL DATA:  New onset seizure EXAM: MRI HEAD WITHOUT CONTRAST TECHNIQUE: Multiplanar, multiecho pulse sequences of the brain and surrounding structures were obtained without intravenous contrast. COMPARISON:  07/29/2014 FINDINGS: Brain: No acute infarction, hemorrhage, hydrocephalus, extra-axial collection or mass lesion. No chronic microhemorrhage. Minimal multifocal white matter hyperintense T2-weighted signal. Normal CSF spaces. Midline structures are normal. Vascular: Normal flow voids. Skull and upper cervical spine: Normal marrow signal. Sinuses/Orbits: Moderate paranasal sinus mucosal thickening, greatest in the ethmoid air cells. Normal orbits. Other: None IMPRESSION: 1. No acute intracranial abnormality. Electronically Signed   By: Deatra Robinson M.D.   On: 11/20/2022 00:22   MR LUMBAR SPINE WO CONTRAST  Result Date: 11/20/2022 CLINICAL DATA:  Low back pain EXAM: MRI LUMBAR SPINE WITHOUT CONTRAST TECHNIQUE: Multiplanar, multisequence MR imaging of the lumbar spine was performed. No intravenous contrast was administered. COMPARISON:  03/26/2022 FINDINGS: Segmentation:  Standard. Alignment:  Physiologic. Vertebrae:  No fracture, evidence of discitis, or bone lesion. Conus  medullaris and cauda equina: Conus extends to the L1 level. Conus and cauda equina appear normal. Paraspinal and other soft tissues: Negative. Disc levels: T12-L1: Normal. L1-L2: Normal disc space and facet joints. No spinal canal stenosis. No neural foraminal stenosis. L2-L3: Small right subarticular disc protrusion, decreased in size. Minimal narrowing of the right lateral recess. No central spinal canal stenosis. No neural foraminal stenosis. L3-L4: Unchanged small central disc protrusion. No spinal canal stenosis. No neural foraminal stenosis. L4-L5: Partial involution previously demonstrated right subarticular extrusion. Persistent small bulge. No spinal canal stenosis. No neural foraminal stenosis. L5-S1: Small disc bulge, unchanged. No spinal canal stenosis. No neural foraminal stenosis. Visualized sacrum: Normal. IMPRESSION: 1. No cauda equina compression. 2. Partial involution of previously demonstrated right subarticular disc extrusion at L4-L5. 3. Small right subarticular disc protrusion at L2-L3, decreased in size. 4. Unchanged small central disc protrusion at L3-L4 and small disc bulge at L5-S1. No spinal canal or neural foraminal stenosis. Electronically Signed   By: Deatra Robinson M.D.   On: 11/20/2022 00:15   CT Lumbar Spine Wo Contrast  Result Date: 11/19/2022 CLINICAL DATA:  Seizure, fell EXAM: CT Lumbar Spine without contrast TECHNIQUE: Technique: Multiplanar CT images of the lumbar spine were reconstructed from contemporary CT of the Abdomen and Pelvis. RADIATION DOSE REDUCTION: This exam was performed according to the departmental dose-optimization program which includes automated exposure control, adjustment of the mA and/or kV according to patient size and/or use of iterative reconstruction technique. CONTRAST:  No additional COMPARISON:  03/26/2022 FINDINGS: Segmentation: 5 lumbar type vertebrae. Alignment: Normal. Vertebrae: No acute fracture or focal pathologic process. Paraspinal and  other soft tissues: Negative. Disc levels: At L3-4 there is circumferential disc bulge and bilateral facet hypertrophy without significant compressive sequela. At L4-5 there is circumferential disc bulge with bilateral facet and ligamentum flavum hypertrophy, with mild-to-moderate central canal stenosis. At L5-S1 there is circumferential disc bulge slightly asymmetric to the left. Bilateral facet hypertrophy. Left lateral recess narrowing with mild symmetrical bilateral neural foraminal encroachment. Reconstructed images demonstrate no additional findings. IMPRESSION: 1. No acute lumbar spine fracture. 2. Stable lower lumbar spondylosis, without significant change since prior MRI. Electronically Signed   By: Sharlet Salina M.D.   On: 11/19/2022 19:12   CT ABDOMEN PELVIS W CONTRAST  Result Date: 11/19/2022 CLINICAL DATA:  Seizure, fell, abdominal trauma EXAM: CT ABDOMEN  AND PELVIS WITH CONTRAST TECHNIQUE: Multidetector CT imaging of the abdomen and pelvis was performed using the standard protocol following bolus administration of intravenous contrast. RADIATION DOSE REDUCTION: This exam was performed according to the departmental dose-optimization program which includes automated exposure control, adjustment of the mA and/or kV according to patient size and/or use of iterative reconstruction technique. CONTRAST:  OMNIPAQUE IOHEXOL 300 MG/ML  SOLN COMPARISON:  None Available. FINDINGS: Lower chest: No acute pleural or parenchymal lung disease. Two left lower lobe pulmonary nodules are identified. Solid nodule image 7/4 measures 4 mm, and a solid nodule image 19/4 measures 3 mm. Hepatobiliary: No focal liver abnormality is seen. No gallstones, gallbladder wall thickening, or biliary dilatation. Pancreas: Unremarkable. No pancreatic ductal dilatation or surrounding inflammatory changes. Spleen: Normal in size without focal abnormality. Adrenals/Urinary Tract: Adrenal glands are unremarkable. Kidneys are  normal, without renal calculi, focal lesion, or hydronephrosis. Bladder is significantly distended, without acute abnormality. Stomach/Bowel: No bowel obstruction or ileus. Normal appendix right lower quadrant. No bowel wall thickening or inflammatory change. Vascular/Lymphatic: No significant vascular findings are present. No enlarged abdominal or pelvic lymph nodes. Reproductive: Prostate is unremarkable. Other: No free fluid or free intraperitoneal gas. No abdominal wall hernia. Musculoskeletal: No acute or destructive bony lesions. Reconstructed images demonstrate no additional findings. IMPRESSION: 1. No acute intra-abdominal or intrapelvic process. 2. Multiple pulmonary nodules. Most significant: 4 mm solid pulmonary nodule. Per Fleischner Society Guidelines, no routine follow-up imaging is recommended. These guidelines do not apply to immunocompromised patients and patients with cancer. Follow up in patients with significant comorbidities as clinically warranted. For lung cancer screening, adhere to Lung-RADS guidelines. Reference: Radiology. 2017; 284(1):228-43. Electronically Signed   By: Sharlet Salina M.D.   On: 11/19/2022 19:09   CT Head Wo Contrast  Result Date: 11/19/2022 CLINICAL DATA:  Head trauma, moderate-severe; Neck trauma, dangerous injury mechanism (Age 81-64y) EXAM: CT HEAD WITHOUT CONTRAST CT CERVICAL SPINE WITHOUT CONTRAST TECHNIQUE: Multidetector CT imaging of the head and cervical spine was performed following the standard protocol without intravenous contrast. Multiplanar CT image reconstructions of the cervical spine were also generated. RADIATION DOSE REDUCTION: This exam was performed according to the departmental dose-optimization program which includes automated exposure control, adjustment of the mA and/or kV according to patient size and/or use of iterative reconstruction technique. COMPARISON:  09/29/2022 FINDINGS: CT HEAD FINDINGS Brain: No evidence of acute infarction,  hemorrhage, hydrocephalus, extra-axial collection or mass lesion/mass effect. Vascular: No hyperdense vessel or unexpected calcification. Skull: Normal. Negative for fracture or focal lesion. Sinuses/Orbits: Diffuse mucosal thickening of the paranasal sinuses bilaterally, most pronounced within the ethmoid air cells. Other: Negative for scalp hematoma. CT CERVICAL SPINE FINDINGS Alignment: Facet joints are aligned without dislocation or traumatic listhesis. Dens and lateral masses are aligned. Skull base and vertebrae: No acute fracture. No primary bone lesion or focal pathologic process. Soft tissues and spinal canal: No prevertebral fluid or swelling. No visible canal hematoma. Disc levels: Intervertebral disc heights are relatively well preserved with mild facet and uncovertebral arthropathy. Upper chest: Biapical pleuroparenchymal scarring and mild paraseptal emphysema. Other: None. IMPRESSION: 1. No acute intracranial abnormality. 2. No acute fracture or subluxation of the cervical spine. 3. Diffuse paranasal sinus disease. Electronically Signed   By: Duanne Guess D.O.   On: 11/19/2022 16:50   CT Cervical Spine Wo Contrast  Result Date: 11/19/2022 CLINICAL DATA:  Head trauma, moderate-severe; Neck trauma, dangerous injury mechanism (Age 78-64y) EXAM: CT HEAD WITHOUT CONTRAST CT CERVICAL SPINE WITHOUT CONTRAST TECHNIQUE:  Multidetector CT imaging of the head and cervical spine was performed following the standard protocol without intravenous contrast. Multiplanar CT image reconstructions of the cervical spine were also generated. RADIATION DOSE REDUCTION: This exam was performed according to the departmental dose-optimization program which includes automated exposure control, adjustment of the mA and/or kV according to patient size and/or use of iterative reconstruction technique. COMPARISON:  09/29/2022 FINDINGS: CT HEAD FINDINGS Brain: No evidence of acute infarction, hemorrhage, hydrocephalus,  extra-axial collection or mass lesion/mass effect. Vascular: No hyperdense vessel or unexpected calcification. Skull: Normal. Negative for fracture or focal lesion. Sinuses/Orbits: Diffuse mucosal thickening of the paranasal sinuses bilaterally, most pronounced within the ethmoid air cells. Other: Negative for scalp hematoma. CT CERVICAL SPINE FINDINGS Alignment: Facet joints are aligned without dislocation or traumatic listhesis. Dens and lateral masses are aligned. Skull base and vertebrae: No acute fracture. No primary bone lesion or focal pathologic process. Soft tissues and spinal canal: No prevertebral fluid or swelling. No visible canal hematoma. Disc levels: Intervertebral disc heights are relatively well preserved with mild facet and uncovertebral arthropathy. Upper chest: Biapical pleuroparenchymal scarring and mild paraseptal emphysema. Other: None. IMPRESSION: 1. No acute intracranial abnormality. 2. No acute fracture or subluxation of the cervical spine. 3. Diffuse paranasal sinus disease. Electronically Signed   By: Duanne Guess D.O.   On: 11/19/2022 16:50   DG Foot Complete Left  Result Date: 11/19/2022 CLINICAL DATA:  Trauma, fall EXAM: LEFT FOOT - COMPLETE 3+ VIEW COMPARISON:  None Available. FINDINGS: No recent fracture or dislocation is seen. There is 2 mm smooth marginated calcification adjacent to the medial margin of the medial cuneiform seen in the oblique projection. IMPRESSION: No recent fracture or dislocation is seen. 2 mm smooth marginated calcification adjacent to medial cuneiform may suggest old avulsion or ligament calcification from previous injury. Electronically Signed   By: Ernie Avena M.D.   On: 11/19/2022 16:31   DG Hip Unilat W or Wo Pelvis 2-3 Views Right  Result Date: 11/19/2022 CLINICAL DATA:  Trauma, fall EXAM: DG HIP (WITH OR WITHOUT PELVIS) 2-3V RIGHT COMPARISON:  None Available. FINDINGS: No recent fracture or dislocation is seen. Phleboliths are  seen in pelvis. SI joints are symmetrical. IMPRESSION: No fracture or dislocation is seen. Electronically Signed   By: Ernie Avena M.D.   On: 11/19/2022 16:29   DG Shoulder Right  Result Date: 11/19/2022 CLINICAL DATA:  Trauma, fall EXAM: RIGHT SHOULDER - 2+ VIEW COMPARISON:  05/12/2022 FINDINGS: No fracture or dislocation is seen. There are no abnormal soft tissue calcifications. IMPRESSION: No radiographic abnormalities are seen in right shoulder. Electronically Signed   By: Ernie Avena M.D.   On: 11/19/2022 16:27    Procedures Procedures    Medications Ordered in ED Medications  levETIRAcetam (KEPPRA) IVPB 1000 mg/100 mL premix (has no administration in time range)    ED Course/ Medical Decision Making/ A&P                           Medical Decision Making Amount and/or Complexity of Data Reviewed Labs: ordered. Decision-making details documented in ED Course. Radiology: ordered and independent interpretation performed. Decision-making details documented in ED Course. ECG/medicine tests: ordered and independent interpretation performed. Decision-making details documented in ED Course.  Risk Prescription drug management. Decision regarding hospitalization.   Suspected seizure with noncompliance with seizure medications.  Complaining of headache, shoulder and hip pain.  No blood thinner use.  Vital stable, no distress, no  fever.  Will give IV dose of Keppra, check x-rays of hip and shoulder as well as CT head and C-spine  CT head and C-spine negative for acute traumatic pathology.  Right shoulder x-ray and pelvis x-ray are negative. L foot Xray with evidence of old trauma. Results reviewed and interpreted by me.  While patient was in CT scan he apparently injured his left hip and back while he was being transferred from the stretcher to the CT table.  Complaining of severe pain of his left hip and his low back which is new. Unclear what transpired in imaging  department but patient states he was "dropped" from CT table to stretcher.  Patient is abrasive and verbally aggressive upon returning from imaging.  He did allow me to examine his back which is tender across his lumbar spine, left iliac crest and left hip. No shortening or external rotation. Intact DP pulses.  Discussed we will order additional imaging of his left hip and lumbar spine.  Traumatic imaging is negative for acute pathology.  No fractures.  Does have chronic spondylotic changes in the lumbar spine.  Patient remained confused and somewhat repetitive.  Concern for postictal state.  Also has ongoing left-sided leg weakness which is slowly improving.  Patient states he has "mental status changes" when I get a seizure and am not the same person". He apologized for his abrasive speech.  If he were postictal, would expect somnolence but patient is fully awake, oriented, though somewhat repetitive and tangential with conversation, unclear baseline.  Question atypical postictal state.   D/w neurology Dr. Amada Jupiter who agrees that keppra can cause agitation and aggressiveness though not likely in this case given that patient has been on keppra for years seemingly without issue.  State transition to depakote can be considered.   With ongoing L leg weakness, concern for ongoing altered mental status and possible postictal state, will plan admission for further monitoring and evaluation. 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   Admission d/w Dr. Allena Katz.       Final Clinical Impression(s) / ED Diagnoses Final diagnoses:  Seizure disorder (HCC)  Postictal state [facility])    Rx / DC Orders ED Discharge Orders     None         Jorge Retz, Jeannett Senior, MD 11/20/22 0139

## 2022-11-19 NOTE — ED Notes (Signed)
Patient returned from CT stating when he was transferred from the stretcher to imaging table he had pain in his back. He states he does not want imaging back into his room nor does he want the MD who previously saw him. RN tried to calm patient down and redirect him but patient continues to yell out, "MY [employer]ERS ARE ON THEIR WAY". Charge nurse and MD made aware

## 2022-11-19 NOTE — H&P (Signed)
History and Physical    Wayne Hawkins [id number] DOB: [birth year] DOA: 11/19/2022  PCP: Delene Ruffini, MD  Patient coming from: Home via EMS  I have personally briefly reviewed patient's old medical records in [place]  Chief Complaint: Seizure activity with fall  HPI: Wayne Hawkins is a 52 y.o. 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  History is difficult to obtain from patient due to cooperation.  He has been agitated with aggressive and threatening behavior while in the ED.  He is awake, alert, oriented and speaking in full clear sentences but speech is tangential and does not answer questions directly.  Patient states he fell today when he was attempting to stand up.  He says he hit his head on the ground as well as his right shoulder which was previously diagnosed as a frozen shoulder.  He believes he had a seizure similar to prior.  He states he does have his prescription for Keppra and medication at home.  Does not answer directly if he has been taking it regularly but reported to the ED physician that he did not take his dose today and that he misses it about twice weekly.  He says while he was being transferred out of bed to the CT table for his imaging he was dropped onto his lower back and has been having left lower back/hip pain.  Physical exam is also limited due to cooperation.  Strength is equal upper extremities.  RLE strength is intact.  He is able to lift his left lower extremity against gravity but does not allow testing against resistance.  ED Course  Labs/Imaging on admission: I have personally reviewed following labs and imaging studies.  Initial vitals showed BP 121/90, pulse 74, RR 20, temp 97.9 F, SpO2 92% on room air.  Labs show WBC 7.6, hemoglobin 14.9,  platelets 316,000, sodium 138, potassium 4.2, bicarb 24, BUN 7, creatinine 0.96, serum glucose 95, LFTs within normal limits, serum ethanol <10, troponin <2.  UA and UDS pending.  Right shoulder x-ray negative for acute abnormality.  Right hip x-ray negative for fracture or dislocation.  Left foot x-ray negative for recent fracture or dislocation.  CT head without contrast negative for acute intracranial normalities.  CT cervical spine without contrast negative for acute fracture of subluxation.  CT abdomen/pelvis with contrast negative for acute intra-abdominal or intrapelvic process.  Multiple pulmonary nodules noted, most significant 4 mm solid pulmonary nodule.  CT lumbar spine without contrast negative for acute lumbar spine fracture.  Stable lower lumbar spondylosis without significant change since prior MRI noted.  Patient noted to have aggressive and threatening behavior while in the ED.  Patient was given IV Keppra 1000 mg, IV Ativan 1 mg, Percocet.  Patient was felt to have persistent postictal state with left lower extremity weakness.  MRI lumbar spine was ordered and pending.  The hospitalist service was consulted to admit for further evaluation and management.  Review of Systems: All systems reviewed and are negative except as documented in history of present illness above.   Past Medical History:  Diagnosis Date   Asthma    COPD (chronic obstructive pulmonary disease) (HCC)    Seizures (HCC)    TBI (traumatic brain injury) ([place])    age 11    No past surgical history on file.  Social History:  reports that he quit smoking about 6 years ago. His smoking use included cigarettes. He has a 16.00 pack-year smoking history. He has never used smokeless tobacco. He reports that he does not currently use drugs after having used the following drugs: Marijuana. He reports that he does not drink alcohol.  No Known Allergies  Family History  Problem Relation Age of Onset    Hypertension Mother    Hypertension Father      Prior to Admission medications   Medication Sig Start Date End Date Taking? Authorizing Provider  albuterol (PROVENTIL HFA;VENTOLIN HFA) 108 (90 Base) MCG/ACT inhaler Inhale 2 puffs into the lungs every 6 (six) hours as needed for wheezing or shortness of breath.  12/25/18   [provider]  budesonide-formoterol (SYMBICORT) 160-4.5 MCG/ACT inhaler Inhale 2 puffs into the lungs 2 (two) times daily.    [provider]  diazePAM (VALTOCO 10 MG DOSE) 10 MG/0.1ML LIQD Place 20 mg into the nose as needed for active seizure 07/02/22   Atway, Rayann N, DO  diphenhydrAMINE (BENADRYL) 25 MG tablet Take 50 mg by mouth at bedtime as needed for allergies.    [provider]  diphenhydramine-acetaminophen (TYLENOL PM) 25-500 MG TABS tablet Take 2 tablets by mouth at bedtime as needed (sleep).    [provider]  DULoxetine (CYMBALTA) 60 MG capsule Take 1 capsule (60 mg total) by mouth daily. 04/22/22   Maudie Mercury, MD  gabapentin (NEURONTIN) 600 MG tablet Take 1 tablet (600 mg total) by mouth 3 (three) times daily. 04/22/22   Maudie Mercury, MD  levETIRAcetam (KEPPRA) 500 MG tablet Take 1 tablet (500 mg total) by mouth 2 (two) times daily. 07/02/22   Atway, Rayann N, DO  naproxen (NAPROSYN) 375 MG tablet Take 1 tablet twice daily as needed for shoulder pain. 09/29/22   Molpus, Jenny Reichmann, MD    Physical Exam: Vitals:   11/19/22 1730 11/19/22 1900 11/19/22 1915 11/19/22 2023  BP:  115/73 115/73 125/72  Pulse: 67  70 69  Resp: 18  18 15   Temp:   (!) 97.4 F (36.3 C) 97.9 F (36.6 C)  TempSrc:   Oral Oral  SpO2: 93%  96% 96%  Weight:      Height:       Exam limited due to cooperation. Constitutional: Resting in bed, agitated and abrasive mood Eyes: PERRL, lids and conjunctivae normal ENMT: Mucous membranes are moist. Posterior pharynx clear of any exudate or lesions.Normal dentition.  Neck: normal, supple, no  masses. Respiratory: clear to auscultation bilaterally, no wheezing, no crackles. Normal respiratory effort. No accessory muscle use.  Cardiovascular: Regular rate and rhythm, no murmurs / rubs / gallops. No extremity edema. 2+ pedal pulses. Abdomen: no tenderness Musculoskeletal: no clubbing / cyanosis. No joint deformity upper and lower extremities.  Passive ROM appears intact, does not allow testing against resistance to LLE. Skin: no rashes, lesions, ulcers. No induration Neurologic: Sensation intact. Strength appears largely 5/5 all extremities although exam of LLE limited due to cooperation.  He is able to lift left leg against gravity.  Does not allow testing against resistance. Psychiatric: Awake, alert, oriented x 3.  Mood is aggressive and abrasive.  Tangential and circumferential speech.  Not really answering questions directly.  EKG: Personally reviewed. Sinus rhythm, early repolarization changes, wandering leads.  Assessment/Plan Principal Problem:   Seizure disorder (HCC) Active Problems:   Low back pain   COPD (chronic obstructive pulmonary disease) with acute bronchitis ([place])   Wayne Hawkins is a  51 y.o. male with medical history significant for COPD, seizure disorder, lumbar spinal stenosis with radiculopathy, reported TBI age 89 who is admitted with seizure activity.  Assessment and Plan: * Seizure disorder [facility]) Patient presenting after reported seizure episode at home resulting in a fall.  Reported inconsistent use with home Keppra.  No further seizure activity while in the ED although there was concern for atypical postictal state due to abrasive/belligerent behavior with unclear baseline. -Neurology consulted, they will evaluate -S/p 1000 mg IV Keppra -Resume home dose Keppra 500 mg BID -MRI brain -Seizure precautions  Low back pain Fall at home Patient reports fall at home during seizure activity.  Extensive traumatic imaging workup is reassuring.  Patient has  known mild-moderate lumbar spinal stenosis with spondylosis.  Patient reports significant left lower lumbar/hip pain occurring after he was transferred to CT table.  Follow-up CT L-spine was negative for acute lumbar spine fracture, stable lower lumbar spondylosis noted. -Follow MRI lumbar spine -Continue home gabapentin -PT/OT eval, fall precautions  COPD (chronic obstructive pulmonary disease) with acute bronchitis (HCC) Stable continue Dulera and albuterol as needed.  DVT prophylaxis: enoxaparin (LOVENOX) injection 40 mg Start: 11/20/22 2200 Code Status: Full code Family Communication: Patient's friend listening on speaker phone Disposition Plan: From home, dispo pending clinical progress Consults called: Neurology Severity of Illness: The appropriate patient status for this patient is OBSERVATION. Observation status is judged to be reasonable and necessary in order to provide the required intensity of service to ensure the patient's safety. The patient's presenting symptoms, physical exam findings, and initial radiographic and laboratory data in the context of their medical condition is felt to place them at decreased risk for further clinical deterioration. Furthermore, it is anticipated that the patient will be medically stable for discharge from the hospital within 2 midnights of admission.   Wayne Mclean MD [facility]  If 7PM-7AM, please contact night-coverage [email or web address]  11/19/2022, 10:23 PM

## 2022-11-19 NOTE — ED Triage Notes (Signed)
Patient brought in from home by EMS after having a seizure causing fall patient to hit his head on floor no injuries. He has a HX of seizure and TBI, takes Keppra.   He also c/o right shoulder pain states he was DX with frozen shoulder.  128/78 80 94% RA CBG: 121

## 2022-11-19 NOTE — Assessment & Plan Note (Signed)
Patient presenting after reported seizure episode at home resulting in a fall.  Reported inconsistent use with home Keppra.  No further seizure activity while in the ED although there was concern for atypical postictal state due to abrasive/belligerent behavior with unclear baseline. -Neurology consulted, they will evaluate -S/p 1000 mg IV Keppra -Resume home dose Keppra 500 mg BID -MRI brain -Seizure precautions

## 2022-11-19 NOTE — Assessment & Plan Note (Signed)
Stable continue Dulera and albuterol as needed.

## 2022-11-20 ENCOUNTER — Other Ambulatory Visit (HOSPITAL_COMMUNITY): Payer: Self-pay

## 2022-11-20 ENCOUNTER — Observation Stay (HOSPITAL_COMMUNITY): Payer: Medicaid Other

## 2022-11-20 DIAGNOSIS — G40909 Epilepsy, unspecified, not intractable, without status epilepticus: Secondary | ICD-10-CM | POA: Diagnosis not present

## 2022-11-20 LAB — URINALYSIS, ROUTINE W REFLEX MICROSCOPIC
Bilirubin Urine: NEGATIVE
Glucose, UA: NEGATIVE mg/dL
Hgb urine dipstick: NEGATIVE
Ketones, ur: 15 mg/dL — AB
Leukocytes,Ua: NEGATIVE
Nitrite: NEGATIVE
Protein, ur: NEGATIVE mg/dL
Specific Gravity, Urine: 1.01 (ref 1.005–1.030)
pH: 7 (ref 5.0–8.0)

## 2022-11-20 LAB — RAPID URINE DRUG SCREEN, HOSP PERFORMED
Amphetamines: NOT DETECTED
Barbiturates: NOT DETECTED
Benzodiazepines: POSITIVE — AB
Cocaine: NOT DETECTED
Opiates: POSITIVE — AB
Tetrahydrocannabinol: POSITIVE — AB

## 2022-11-20 LAB — HIV ANTIBODY (ROUTINE TESTING W REFLEX): HIV Screen 4th Generation wRfx: NONREACTIVE

## 2022-11-20 LAB — URIC ACID: Uric Acid, Serum: 3 mg/dL — ABNORMAL LOW (ref 3.7–8.6)

## 2022-11-20 MED ORDER — METOPROLOL TARTRATE 5 MG/5ML IV SOLN: 5.0000 mg | INTRAVENOUS | Status: DC | PRN

## 2022-11-20 MED ORDER — GUAIFENESIN 100 MG/5ML PO LIQD: 5.0000 mL | ORAL | Status: DC | PRN

## 2022-11-20 MED ORDER — HYDRALAZINE HCL 20 MG/ML IJ SOLN: 10.0000 mg | INTRAMUSCULAR | Status: DC | PRN

## 2022-11-20 MED ORDER — BUDESONIDE-FORMOTEROL FUMARATE 160-4.5 MCG/ACT IN AERO
2.0000 | INHALATION_SPRAY | Freq: Two times a day (BID) | RESPIRATORY_TRACT | Status: DC
  Administered 2022-11-20 – 2022-11-22 (×4): 2 via RESPIRATORY_TRACT

## 2022-11-20 MED ORDER — DEXAMETHASONE 4 MG PO TABS
4.0000 mg | ORAL_TABLET | Freq: Every day | ORAL | Status: DC
  Administered 2022-11-20 – 2022-11-22 (×3): 4 mg via ORAL
  Filled 2022-11-20 (×3): qty 1

## 2022-11-20 MED ORDER — TRAZODONE HCL 50 MG PO TABS
50.0000 mg | ORAL_TABLET | Freq: Every evening | ORAL | Status: DC | PRN
  Administered 2022-11-21 (×2): 50 mg via ORAL
  Filled 2022-11-20 (×2): qty 1

## 2022-11-20 MED ORDER — IPRATROPIUM-ALBUTEROL 0.5-2.5 (3) MG/3ML IN SOLN: 3.0000 mL | RESPIRATORY_TRACT | Status: DC | PRN

## 2022-11-20 MED ORDER — MORPHINE SULFATE (PF) 2 MG/ML IV SOLN
1.0000 mg | Freq: Once | INTRAVENOUS | Status: AC
  Administered 2022-11-20: 1 mg via INTRAVENOUS
  Filled 2022-11-20: qty 1

## 2022-11-20 MED ORDER — KETOROLAC TROMETHAMINE 30 MG/ML IJ SOLN
30.0000 mg | Freq: Three times a day (TID) | INTRAMUSCULAR | Status: DC
  Administered 2022-11-20 – 2022-11-22 (×6): 30 mg via INTRAVENOUS
  Filled 2022-11-20 (×6): qty 1

## 2022-11-20 MED ORDER — NAPROXEN 375 MG PO TABS: 375.0000 mg | ORAL_TABLET | Freq: Two times a day (BID) | ORAL | Status: DC | PRN

## 2022-11-20 MED ORDER — OXYCODONE HCL 5 MG PO TABS
5.0000 mg | ORAL_TABLET | Freq: Four times a day (QID) | ORAL | Status: DC | PRN
  Administered 2022-11-21 (×2): 5 mg via ORAL
  Filled 2022-11-20 (×2): qty 1
  Filled 2022-11-20: qty 2

## 2022-11-20 NOTE — Progress Notes (Signed)
PHYSICAL Therapy Evaluation Patient Details Name: Wayne Hawkins MRN: [id number] DOB: [birth year] Today's Date: 11/20/2022  Pt presents with the problems and functional impairments below. Reports independence with all mobility and ADLs at home, though does endorse repeated falls when seizures occur. Received supine in bed complaining of lowback pain radiating into the LLE, reported as "sciatica," also reporting some L hallux pain as well with fear of movement at the L toes and ankle, able to perform range of motion testing with encouragement. Pt had functional range of motion in all extremities and requested deferment of muscular testing secondary to pain. Required mod assist with bed mobility with increased effort, deferred OOB mobility and ambulation attempt. Pt very communicative and loquacious and very engaged with his care, required redirection and reassurance. Pt tearful at times and demonstrated some word-finding difficulty. Unable to recommend particular discharge destination at this time due to uncertainty regarding both prior and current level of function as well as possible assistance at home. We will continue to follow acutely.    11/20/22 1233  PT Visit Information  Last PT Received On 11/20/22  Assistance Needed +2  PT/OT/SLP Co-Evaluation/Treatment Yes  Reason for Co-Treatment For patient/therapist safety  PT goals addressed during session Mobility/safety with mobility  OT goals addressed during session ADL's and self-care  History of Present Illness Wayne Hawkins is a 51 yr old male brought to the hospital after a suspected seizure episode at home with an associated fall. PMH: COPD, seizure disorder, lumbar spinal stenosis, TBI in childhood, asthma  Precautions  Precautions Fall  Precaution Comments Hx of falling  Restrictions  Weight Bearing Restrictions No  Other Position/Activity Restrictions seizure precautions  Home Living  Family/patient expects to be discharged to: Private  residence  Living Arrangements Other (Comment) (He reported living with a partner)  Type of Home House  Home Access Stairs to enter  Entrance Stairs-Number of Steps 1  Home Layout One level  Home Equipment Rolling Walker (2 wheels)  Prior Function  Prior Level of Function  Independent/Modified Independent;History of Falls (last six months)  Mobility Comments He reported ambulating without an assistive device. Reports he is falling when he has seizures  ADLs Comments He reported being independent with ADLs, as long as he has had a seizure.  Communication  Communication No difficulties (mild word-finding difficulty)  Pain Assessment  Pain Assessment Faces  Faces Pain Scale 2  Pain Location He reported having significant low back and LLE pain, which he did not specifically quantify. He reported having sciatica and spinal stenosis.  Pain Descriptors / Indicators Discomfort;Grimacing  Pain Intervention(s) Limited activity within patient's tolerance;Repositioned  Cognition  Arousal/Alertness Awake/alert  Behavior During Therapy Agitated  Overall Cognitive Status No family/caregiver present to determine baseline cognitive functioning  General Comments Able to follow commands though required increased redirection due to being verbally expressive, oriented to person, place, and situation and presumably to time as well. Tearful at one point. Occasional word finding difficulty  Upper Extremity Assessment  Upper Extremity Assessment Defer to OT evaluation  RUE Deficits / Details Patient reports having frozen shoulder. His shoulder, elbow, and hand AROM were WFL. Functional grip strength  LUE Deficits / Details AROM WFL. Functional grip strength  Lower Extremity Assessment  Lower Extremity Assessment RLE deficits/detail;LLE deficits/detail  RLE Deficits / Details AROM WFL. Resistance testing deferred, due to pt gaurding and reports of low back pain  RLE Sensation WNL  LLE Deficits / Details AROM  WFL. Resistance testing deferred, due to pt gaurding  and reports of low back pain with radiating LE pain L>R, reports R hallux pain.  LLE Sensation WNL  Bed Mobility  Overal bed mobility Needs Assistance  Bed Mobility Sit to Supine;Supine to Sit  Supine to sit Mod assist  Sit to supine Mod assist  General bed mobility comments Pt with slower guarded movements due to reported pain, requiring increased time and general cues for sequencing/technique  Transfers  General transfer comment unable to assess, as the pt deferred  Ambulation/Gait  General Gait Details Pt deferred  Balance  Overall balance assessment Needs assistance  Sitting-balance support Feet unsupported;Bilateral upper extremity supported  Sitting balance-Leahy Scale Fair  Standing balance comment Unable to assess  PT - End of Session  Activity Tolerance Patient limited by pain;Patient limited by fatigue  Patient left in bed;with nursing/sitter in room;with call bell/phone within reach;with bed alarm set  Nurse Communication Mobility status  PT Assessment  PT Recommendation/Assessment Patient needs continued PT services  PT Visit Diagnosis Pain;Difficulty in walking, not elsewhere classified (R26.2);History of falling (Z91.81)  Pain - Right/Left Left  Pain - part of body Ankle and joints of foot (lumbar spine)  PT Problem List Decreased strength;Decreased range of motion;Decreased activity tolerance;Decreased balance;Decreased mobility;Decreased coordination;Decreased safety awareness;Pain  PT Plan  PT Frequency (ACUTE ONLY) Min 2X/week  PT Treatment/Interventions (ACUTE ONLY) DME instruction;Gait training;Stair training;Functional mobility training;Therapeutic activities;Therapeutic exercise;Balance training;Neuromuscular re-education;Patient/family education  AM-PAC PT "6 Clicks" Mobility Outcome Measure (Version 2)  Help needed turning from your back to your side while in a flat bed without using bedrails? 3  Help needed  moving from lying on your back to sitting on the side of a flat bed without using bedrails? 3  Help needed moving to and from a bed to a chair (including a wheelchair)? 2  Help needed standing up from a chair using your arms (e.g., wheelchair or bedside chair)? 1  Help needed to walk in hospital room? 1  Help needed climbing 3-5 steps with a railing?  1  6 Click Score 11  Consider Recommendation of Discharge To: CIR/SNF/LTACH  Progressive Mobility  What is the highest level of mobility based on the progressive mobility assessment? Level 2 ([place]) - Balance while sitting on edge of bed and cannot stand  Activity Dangled on edge of bed  PT Recommendation  Follow Up Recommendations Other (comment) (TBD)  Assistance recommended at discharge Frequent or constant Supervision/Assistance  Patient can return home with the following Help with stairs or ramp for entrance;Assist for transportation;Assistance with cooking/housework;A little help with bathing/dressing/bathroom;A little help with walking and/or transfers  Functional Status Assessment Patient has had a recent decline in their functional status and demonstrates the ability to make significant improvements in function in a reasonable and predictable amount of time.  PT equipment Other (comment) (TBD)  Individuals Consulted  Consulted and Agree with Results and Recommendations Patient  Acute Rehab PT Goals  Patient Stated Goal To reduce pain  PT Goal Formulation With patient  Time For Goal Achievement 12/04/22  PT Time Calculation  PT Start Time (ACUTE ONLY) 1005  PT Stop Time (ACUTE ONLY) 1023  PT Time Calculation (min) (ACUTE ONLY) 18 min  PT General Charges  $$ ACUTE PT VISIT 1 Visit  PT Evaluation  $PT Eval Low Complexity 1 Low   Jamesetta Geralds, PT, DPT [facility] Rehabilitation Department Office: [phone] Weekend pager: [phone]

## 2022-11-20 NOTE — Consult Note (Signed)
Neurology Consultation Reason for Consult: Seizures Referring Physician: Lacinda Axon, S  CC: Seizures  History is obtained from: Patient, chart  HPI: Wayne Hawkins is a 51 y.o. male with a history of seizures who presents with breakthrough seizures.  I saw him in 2015 at which point he had only had two lifetime seizures.  Since that time he has continued to have seizures.  He states that his most recent one was about 3 weeks ago.  He states that he frequently forgets to take his Keppra.  He was noted to be belligerent in the emergency department, but he is pleasant at the current time.  He complains of some back pain that he attributes to being moved in the CT scanner.  Past Medical History:  Diagnosis Date   Asthma    COPD (chronic obstructive pulmonary disease) (HCC)    Seizures (HCC)    TBI (traumatic brain injury) (HCC)    age 72     Family History  Problem Relation Age of Onset   Hypertension Mother    Hypertension Father      Social History:  reports that he quit smoking about 6 years ago. His smoking use included cigarettes. He has a 16.00 pack-year smoking history. He has never used smokeless tobacco. He reports that he does not currently use drugs after having used the following drugs: Marijuana. He reports that he does not drink alcohol.   Exam: Current vital signs: BP 127/84 (BP Location: Right Arm)   Pulse 79   Temp 98.2 F (36.8 C) (Oral)   Resp 18   Ht 5\' 11"  (1.803 m)   Wt 67.6 kg   SpO2 96%   BMI 20.78 kg/m  Vital signs in last 24 hours: Temp:  [97.4 F (36.3 C)-98.2 F (36.8 C)] 98.2 F (36.8 C) (12/29 0453) Pulse Rate:  [64-115] 79 (12/29 0453) Resp:  [14-29] 18 (12/29 0453) BP: (100-162)/(72-137) 127/84 (12/29 0453) SpO2:  [92 %-97 %] 96 % (12/29 0453) Weight:  [67.6 kg] 67.6 kg (12/28 1449)   Physical Exam  Appears well-developed and well-nourished.   Neuro: Mental Status: Patient is awake, alert, oriented to person, place,and situation.   He is unable to give the month or year but states that this is normal for him. Patient is able to give a clear and coherent history. No signs of aphasia or neglect Cranial Nerves: II: Visual Fields are full. Pupils are equal, round, and reactive to light.   III,IV, VI: EOMI without ptosis or diploplia.  V: Facial sensation is symmetric to temperature VII: Facial movement is symmetric.  VIII: hearing is intact to voice X: Uvula elevates symmetrically XI: Shoulder shrug is symmetric. XII: tongue is midline without atrophy or fasciculations.  Motor: Tone is normal. Bulk is normal. 5/5 strength was present in bilateral arms, though he does not give good effort in his left arm always, complaining of some pain.  He also complains of pain with lifting his left leg and refuses to have formal strength testing, but is able to hold it without drift. Sensory: Sensation is symmetric to light touch and temperature in the arms and legs. Deep Tendon Reflexes: 3+ and symmetric in the biceps and patellae.  Plantars: Toes are downgoing bilaterally.  Cerebellar: FNF and HKS are intact bilaterally  When I ask him to show me where his pain is with leg lift, he demonstrates paraspinal muscles around the lower thoracic or upper lumbar spine.    I have reviewed labs in epic  and the results pertinent to this consultation are: Keppra level from 11/7 <2  I have reviewed the images obtained:MRI brain-negative MRI L-spine-no acute injury  Impression: 51 year old male with breakthrough seizures in the setting of noncompliance.  He may have pulled a muscle due to contorting while moving, but no evidence of acute neurological injury.  He appears to be back to his baseline at the current time.  I encouraged him to get a pill planner as well as setting an alarm on his phone to remind him to take his medications.  This would allow [place] to see exactly how compliant he had been if he were to continue to have breakthrough  seizures and then adjust medications as needed.  At the current time, I would not make adjustments to his medications due to presumed noncompliance.  Recommendations: 1) Keppra 500 mg twice daily 2) neurology will be available on an as-needed basis.   Ritta Slot, MD [facility] [phone]  If 7pm- 7am, please page neurology on call as listed in AMION.

## 2022-11-20 NOTE — ED Notes (Signed)
This RN explained to pt the process of moving to another room upstairs before leaving the ER. The pt agreed and understood protocol. Pt was silent throughout the ride from the ER to the elevator and off to the 4th flour. This RN pushed pt in the room, close to the bed and the pt yells "you pushed my head, thanks" This RN said "I didn't mean to, sorry". Pt says "Don't be sorry now, I don't accept your apology." When the floor nurses came into the room, the pt stated that this RN said "he is the worst patient in the ED and has caused nothing but trouble." The pt wanted to speak to the boss on the floor. The floor nurses called their charge nurse. Charge nurse calmed pt down and tried to explain. The pt wanted the charge nurse to ask the other nurses what was said out of this RN mouth. The [facility] was called and he calmed pt down and got him into the hospital bed that he refused to get into until he talked to the head boss. This RN talked to the [facility] and the charge nurse and cleared up the situation. This RN retrieved her stretcher and left.

## 2022-11-20 NOTE — Evaluation (Signed)
Occupational Therapy Evaluation Patient Details Name: Wayne Hawkins MRN: [id number] DOB: 1971-02-27 Today's Date: 11/20/2022   History of Present Illness Mr. Zeck is a 51 yr old male brought to the hospital after a suspected seizure episode at home with an associated fall. 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 Collington is currently presenting below his baseline level of functioning for self-care management. He reported having increased low back and radiating LLE pain, with the LLE pain being attributed to sciatica. He presented with functional BUE and BLE AROM. He required mod assist with increased effort for supine to sit. He deferred attempts at progressive out of bed activity, due to pain, subsequently requiring mod assist for return to supine. He needed occasional reassurance and redirection, as he was expressive about his current state and the events of his hospital stay. OT will continue to follow the pt for further services during his hospital stay, with the intent to maximize his safety and independence with self-care tasks, and to make appropriate post-hospital discharge recommendations for the pt.      Recommendations for follow up therapy are one component of a multi-disciplinary discharge planning process, led by the attending physician.  Recommendations may be updated based on patient status, additional functional criteria and insurance authorization.   Follow Up Recommendations  Other (comment) (To be determined, pending pt's ability to tolerate progressive activity & his overall functional progress during hospital stay)     Assistance Recommended at Discharge Frequent or constant Supervision/Assistance  Patient can return home with the following Assistance with cooking/housework;Assist for transportation;A lot of help with bathing/dressing/bathroom;A lot of help with walking and/or transfers    Functional  Status Assessment  Patient has had a recent decline in their functional status and demonstrates the ability to make significant improvements in function in a reasonable and predictable amount of time.  Equipment Recommendations  Other (comment) (to be determined)       Precautions / Restrictions Precautions Precautions: Fall Restrictions Weight Bearing Restrictions: No Other Position/Activity Restrictions: seizure precautions      Mobility Bed Mobility Overal bed mobility: Needs Assistance Bed Mobility: Sit to Supine, Supine to Sit     Supine to sit: Mod assist Sit to supine: Mod assist   General bed mobility comments: Pt with slower guarded movements due to reported pain, requiring increased time and general cues for sequencing/technique    Transfers    General transfer comment: unable to assess, as the pt deferred      Balance     Sitting balance-Leahy Scale: Fair         Standing balance comment: Unable to assess           ADL either performed or assessed with clinical judgement   ADL Overall ADL's : Needs assistance/impaired Eating/Feeding: Independent;Bed level Eating/Feeding Details (indicate cue type and reason): based on clinical judgement Grooming: Set up;Bed level Grooming Details (indicate cue type and reason): simulated         Upper Body Dressing : Set up;Supervision/safety;Bed level Upper Body Dressing Details (indicate cue type and reason): simulated   Lower Body Dressing Details (indicate cue type and reason): unable to formally assess, as the pt was limited by reported low back pain seated EOB and he deferred other progressive activity due to this               General ADL Comments: unable to formally assess out of bed ADLs, as  the pt reported having low back & radiating LLE pain; he subsequenrly deferred attempts at progressive activity                  Pertinent Vitals/Pain Pain Assessment Pain Location: He reported having  significant low back and LLE pain, which he did not specifically quantify. He reported having sciatica and spinal stenosis. 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 His shoulder, elbow, and hand AROM were WFL. Functional grip strength LUE Deficits / Details: AROM WFL. Functional grip strength   Lower Extremity Assessment Lower Extremity Assessment: LLE deficits/detail;RLE deficits/detail RLE Deficits / Details: AROM WFL. Resistance testing deferred, due to pt gurading and reports of low back pain LLE Deficits / Details: AROM WFL. Resistance testing deferred, due to pt guarding and reports of low back pain with radiating LE pain          Cognition Arousal/Alertness: Awake/alert   Overall Cognitive Status: No family/caregiver present to determine baseline cognitive functioning      General Comments: Able to follow commands though required increased redirection due to being verbally expressive often about the events of his current hospital stay, oriented to person, place, and situation and presumably to time as well. Tearful at one point. Occasional word finding difficulty                Home Living Family/patient expects to be discharged to:: Private residence Living Arrangements: Other (Comment) (He reported living with a partner)   Type of Home: House Home Access: Stairs to enter [employer] of Steps: 1   Home Layout: One level               Home Equipment: Agricultural consultant (2 wheels)          Prior Functioning/Environment        Mobility Comments: He reported ambulating without an assistive device.  He reported a history of falls.  ADLs Comments: He reported being independent with ADLs, as long as he has had a seizure.         OT Problem List: Decreased strength;Decreased activity tolerance;Impaired balance (sitting and/or standing);Decreased cognition;Decreased knowledge of use of DME or AE;Pain      OT Treatment/Interventions: Self-care/ADL training;Therapeutic exercise;Therapeutic activities;Energy conservation;DME and/or AE instruction;Patient/family education;Balance training    OT Goals(Current goals can be found in the care plan section) Acute Rehab OT Goals OT Goal Formulation: With patient Time For Goal Achievement: 12/04/22 Potential to Achieve Goals: Good ADL Goals Pt Will Perform Grooming: with supervision;standing Pt Will Perform Upper Body Dressing: with set-up;sitting Pt Will Transfer to Toilet: with supervision;ambulating Pt Will Perform Toileting - Clothing Manipulation and hygiene: with supervision;sit to/from stand  OT Frequency: Min 2X/week    Co-evaluation PT/OT/SLP Co-Evaluation/Treatment: Yes   PT goals addressed during session: Mobility/safety with mobility OT goals addressed during session: ADL's and self-care      AM-PAC OT "6 Clicks" Daily Activity     Outcome Measure Help from another person eating meals?: None Help from another person taking care of personal grooming?: A Little Help from another person toileting, which includes using toliet, bedpan, or urinal?: A Lot Help from another person bathing (including washing, rinsing, drying)?: A Lot Help from another person to put on and taking off regular upper body clothing?: A Little Help from another person to put on and taking off regular lower body  clothing?: A Lot 6 Click Score: 16   End of Session Nurse Communication: Mobility status  Activity Tolerance: Patient limited by pain Patient left: in bed;with call bell/phone within reach;with bed alarm set  OT Visit Diagnosis: Pain                Time: 8101-7510 OT Time Calculation (min): 21 min Charges:  OT General Charges $OT Visit: 1 Visit OT Evaluation $OT Eval  Moderate Complexity: 1 Mod    Bronnie Vasseur L Myalee Stengel, OTR/L 11/20/2022, 11:05 AM

## 2022-11-20 NOTE — Progress Notes (Signed)
PROGRESS NOTE    Wayne Hawkins  [id number] DOB: [birth year] DOA: 11/19/2022 PCP: Delene Ruffini, MD   Brief Narrative:   51 y.o. 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  Upon admission there was concerns of possible seizure, neurology recommended continuing Keppra/his home meds at time.  Patient reports that while getting CT scan he was dropped from the table leading to lower back pain.  Extensive workup including CT head, cervical, lumbar spine, MRI has been negative for any acute pathology.  Does have some chronic disc bulge.   Assessment & Plan:  Principal Problem:   Seizure disorder ([place]) Active Problems:   Low back pain   COPD (chronic obstructive pulmonary disease) with acute bronchitis (HCC)     Assessment and Plan: * Seizure disorder [facility]) Patient does not necessarily take his Keppra regularly at home.  Could have had breakthrough seizure secondary to noncompliance.  Seen by neurology.  For now on Keppra 100 mg twice daily  Low back pain Fall at home Patient reports fall at home during seizure activity.  Extensive traumatic imaging workup is reassuring.  Patient has known mild-moderate lumbar spinal stenosis with spondylosis.  Patient reports significant left lower lumbar/hip pain occurring after he was transferred to CT table.  Follow-up CT L-spine was negative for acute lumbar spine fracture, stable lower lumbar spondylosis noted. Extensive imaging including MRI of brain and lower back does not show any acute pathology.  Does have some chronic disc bulge For now we will place him on Decadron 4 mg daily, IV Toradol.  Oxycodone for severe pain.  Avoid IV pain medication/narcotics PT/OT UDS  Left toe pain - X-ray and uric acid levels ordered  COPD (chronic obstructive pulmonary  disease) with acute bronchitis (HCC) Stable continue Dulera and albuterol as needed.          DVT prophylaxis: Lovenox Code Status: Full code Family Communication:    Status is: Observation Severe back pain causing ambulatory difficulty.  Maintain hospital stay   Subjective: Seen and examined at bedside.  Keeps telling me he is having lower back pain and nerve pain shooting down his legs.  Also reporting of left toe pain.   Examination:  General exam: Appears calm and comfortable  Respiratory system: Clear to auscultation. Respiratory effort normal. Cardiovascular system: S1 & S2 heard, RRR. No JVD, murmurs, rubs, gallops or clicks. No pedal edema. Gastrointestinal system: Abdomen is nondistended, soft and nontender. No organomegaly or masses felt. Normal bowel sounds heard. Central nervous system: Alert and oriented. No focal neurological deficits. Extremities: Symmetric 5 x 5 power. Skin: No rashes, lesions or ulcers Psychiatry: Judgement and insight appear normal. Mood & affect appropriate.     Objective: Vitals:   11/20/22 0144 11/20/22 0453 11/20/22 0900 11/20/22 1136  BP: (!) 141/97 127/84 (!) 165/146 (!) 127/93  Pulse: 83 79 (!) 107 85  Resp: 18 18 17 17   Temp: 97.8 F (36.6 C) 98.2 F (36.8 C) 98.6 F (37 C) 98.1 F (36.7 C)  TempSrc: Oral Oral Oral   SpO2: 97% 96% 96% 95%  Weight:      Height:        Intake/Output Summary (Last 24 hours) at 11/20/2022 1223 Last data filed at 11/20/2022 1005 Gross per 24 hour  Intake 81.89 ml  Output 800 ml  Net -718.11 ml   Danley Danker  Weights   11/19/22 1449  Weight: 67.6 kg     Data Reviewed:   CBC: Recent Labs  Lab 11/19/22 1457  WBC 7.6  NEUTROABS 3.3  HGB 14.9  HCT 44.9  MCV 94.5  PLT 123XX123   Basic Metabolic Panel: Recent Labs  Lab 11/19/22 1457  NA 138  K 4.2  CL 107  CO2 24  GLUCOSE 95  BUN 7  CREATININE 0.96  CALCIUM 8.9   GFR: Estimated Creatinine Clearance: 86.1 mL/min (by C-G  formula based on SCr of 0.96 mg/dL). Liver Function Tests: Recent Labs  Lab 11/19/22 1457  AST 15  ALT 13  ALKPHOS 49  BILITOT 0.6  PROT 7.2  ALBUMIN 4.0   No results for input(s): "LIPASE", "AMYLASE" in the last 168 hours. No results for input(s): "AMMONIA" in the last 168 hours. Coagulation Profile: No results for input(s): "INR", "PROTIME" in the last 168 hours. Cardiac Enzymes: No results for input(s): "CKTOTAL", "CKMB", "CKMBINDEX", "TROPONINI" in the last 168 hours. BNP (last 3 results) No results for input(s): "PROBNP" in the last 8760 hours. HbA1C: No results for input(s): "HGBA1C" in the last 72 hours. CBG: No results for input(s): "GLUCAP" in the last 168 hours. Lipid Profile: No results for input(s): "CHOL", "HDL", "LDLCALC", "TRIG", "CHOLHDL", "LDLDIRECT" in the last 72 hours. Thyroid Function Tests: No results for input(s): "TSH", "T4TOTAL", "FREET4", "T3FREE", "THYROIDAB" in the last 72 hours. Anemia Panel: No results for input(s): "VITAMINB12", "FOLATE", "FERRITIN", "TIBC", "IRON", "RETICCTPCT" in the last 72 hours. Sepsis Labs: No results for input(s): "PROCALCITON", "LATICACIDVEN" in the last 168 hours.  No results found for this or any previous visit (from the past 240 hour(s)).       Radiology Studies: MR BRAIN WO CONTRAST  Result Date: 11/20/2022 CLINICAL DATA:  New onset seizure EXAM: MRI HEAD WITHOUT CONTRAST TECHNIQUE: Multiplanar, multiecho pulse sequences of the brain and surrounding structures were obtained without intravenous contrast. COMPARISON:  07/29/2014 FINDINGS: Brain: No acute infarction, hemorrhage, hydrocephalus, extra-axial collection or mass lesion. No chronic microhemorrhage. Minimal multifocal white matter hyperintense T2-weighted signal. Normal CSF spaces. Midline structures are normal. Vascular: Normal flow voids. Skull and upper cervical spine: Normal marrow signal. Sinuses/Orbits: Moderate paranasal sinus mucosal thickening,  greatest in the ethmoid air cells. Normal orbits. Other: None IMPRESSION: 1. No acute intracranial abnormality. Electronically Signed   By: Ulyses Jarred M.D.   On: 11/20/2022 00:22   MR LUMBAR SPINE WO CONTRAST  Result Date: 11/20/2022 CLINICAL DATA:  Low back pain EXAM: MRI LUMBAR SPINE WITHOUT CONTRAST TECHNIQUE: Multiplanar, multisequence MR imaging of the lumbar spine was performed. No intravenous contrast was administered. COMPARISON:  03/26/2022 FINDINGS: Segmentation:  Standard. Alignment:  Physiologic. Vertebrae:  No fracture, evidence of discitis, or bone lesion. Conus medullaris and cauda equina: Conus extends to the L1 level. Conus and cauda equina appear normal. Paraspinal and other soft tissues: Negative. Disc levels: T12-L1: Normal. L1-L2: Normal disc space and facet joints. No spinal canal stenosis. No neural foraminal stenosis. L2-L3: Small right subarticular disc protrusion, decreased in size. Minimal narrowing of the right lateral recess. No central spinal canal stenosis. No neural foraminal stenosis. L3-L4: Unchanged small central disc protrusion. No spinal canal stenosis. No neural foraminal stenosis. L4-L5: Partial involution previously demonstrated right subarticular extrusion. Persistent small bulge. No spinal canal stenosis. No neural foraminal stenosis. L5-S1: Small disc bulge, unchanged. No spinal canal stenosis. No neural foraminal stenosis. Visualized sacrum: Normal. IMPRESSION: 1. No cauda equina compression. 2. Partial involution of previously demonstrated right  subarticular disc extrusion at L4-L5. 3. Small right subarticular disc protrusion at L2-L3, decreased in size. 4. Unchanged small central disc protrusion at L3-L4 and small disc bulge at L5-S1. No spinal canal or neural foraminal stenosis. Electronically Signed   By: Deatra Robinson M.D.   On: 11/20/2022 00:15   CT Lumbar Spine Wo Contrast  Result Date: 11/19/2022 CLINICAL DATA:  Seizure, fell EXAM: CT Lumbar Spine  without contrast TECHNIQUE: Technique: Multiplanar CT images of the lumbar spine were reconstructed from contemporary CT of the Abdomen and Pelvis. RADIATION DOSE REDUCTION: This exam was performed according to the departmental dose-optimization program which includes automated exposure control, adjustment of the mA and/or kV according to patient size and/or use of iterative reconstruction technique. CONTRAST:  No additional COMPARISON:  03/26/2022 FINDINGS: Segmentation: 5 lumbar type vertebrae. Alignment: Normal. Vertebrae: No acute fracture or focal pathologic process. Paraspinal and other soft tissues: Negative. Disc levels: At L3-4 there is circumferential disc bulge and bilateral facet hypertrophy without significant compressive sequela. At L4-5 there is circumferential disc bulge with bilateral facet and ligamentum flavum hypertrophy, with mild-to-moderate central canal stenosis. At L5-S1 there is circumferential disc bulge slightly asymmetric to the left. Bilateral facet hypertrophy. Left lateral recess narrowing with mild symmetrical bilateral neural foraminal encroachment. Reconstructed images demonstrate no additional findings. IMPRESSION: 1. No acute lumbar spine fracture. 2. Stable lower lumbar spondylosis, without significant change since prior MRI. Electronically Signed   By: Sharlet Salina M.D.   On: 11/19/2022 19:12   CT ABDOMEN PELVIS W CONTRAST  Result Date: 11/19/2022 CLINICAL DATA:  Seizure, fell, abdominal trauma EXAM: CT ABDOMEN AND PELVIS WITH CONTRAST TECHNIQUE: Multidetector CT imaging of the abdomen and pelvis was performed using the standard protocol following bolus administration of intravenous contrast. RADIATION DOSE REDUCTION: This exam was performed according to the departmental dose-optimization program which includes automated exposure control, adjustment of the mA and/or kV according to patient size and/or use of iterative reconstruction technique. CONTRAST:  OMNIPAQUE  IOHEXOL 300 MG/ML  SOLN COMPARISON:  None Available. FINDINGS: Lower chest: No acute pleural or parenchymal lung disease. Two left lower lobe pulmonary nodules are identified. Solid nodule image 7/4 measures 4 mm, and a solid nodule image 19/4 measures 3 mm. Hepatobiliary: No focal liver abnormality is seen. No gallstones, gallbladder wall thickening, or biliary dilatation. Pancreas: Unremarkable. No pancreatic ductal dilatation or surrounding inflammatory changes. Spleen: Normal in size without focal abnormality. Adrenals/Urinary Tract: Adrenal glands are unremarkable. Kidneys are normal, without renal calculi, focal lesion, or hydronephrosis. Bladder is significantly distended, without acute abnormality. Stomach/Bowel: No bowel obstruction or ileus. Normal appendix right lower quadrant. No bowel wall thickening or inflammatory change. Vascular/Lymphatic: No significant vascular findings are present. No enlarged abdominal or pelvic lymph nodes. Reproductive: Prostate is unremarkable. Other: No free fluid or free intraperitoneal gas. No abdominal wall hernia. Musculoskeletal: No acute or destructive bony lesions. Reconstructed images demonstrate no additional findings. IMPRESSION: 1. No acute intra-abdominal or intrapelvic process. 2. Multiple pulmonary nodules. Most significant: 4 mm solid pulmonary nodule. Per Fleischner Society Guidelines, no routine follow-up imaging is recommended. These guidelines do not apply to immunocompromised patients and patients with cancer. Follow up in patients with significant comorbidities as clinically warranted. For lung cancer screening, adhere to Lung-RADS guidelines. Reference: Radiology. 2017; 284(1):228-43. Electronically Signed   By: Sharlet Salina M.D.   On: 11/19/2022 19:09   CT Head Wo Contrast  Result Date: 11/19/2022 CLINICAL DATA:  Head trauma, moderate-severe; Neck trauma, dangerous injury mechanism (Age 21-64y)  EXAM: CT HEAD WITHOUT CONTRAST CT CERVICAL SPINE  WITHOUT CONTRAST TECHNIQUE: Multidetector CT imaging of the head and cervical spine was performed following the standard protocol without intravenous contrast. Multiplanar CT image reconstructions of the cervical spine were also generated. RADIATION DOSE REDUCTION: This exam was performed according to the departmental dose-optimization program which includes automated exposure control, adjustment of the mA and/or kV according to patient size and/or use of iterative reconstruction technique. COMPARISON:  09/29/2022 FINDINGS: CT HEAD FINDINGS Brain: No evidence of acute infarction, hemorrhage, hydrocephalus, extra-axial collection or mass lesion/mass effect. Vascular: No hyperdense vessel or unexpected calcification. Skull: Normal. Negative for fracture or focal lesion. Sinuses/Orbits: Diffuse mucosal thickening of the paranasal sinuses bilaterally, most pronounced within the ethmoid air cells. Other: Negative for scalp hematoma. CT CERVICAL SPINE FINDINGS Alignment: Facet joints are aligned without dislocation or traumatic listhesis. Dens and lateral masses are aligned. Skull base and vertebrae: No acute fracture. No primary bone lesion or focal pathologic process. Soft tissues and spinal canal: No prevertebral fluid or swelling. No visible canal hematoma. Disc levels: Intervertebral disc heights are relatively well preserved with mild facet and uncovertebral arthropathy. Upper chest: Biapical pleuroparenchymal scarring and mild paraseptal emphysema. Other: None. IMPRESSION: 1. No acute intracranial abnormality. 2. No acute fracture or subluxation of the cervical spine. 3. Diffuse paranasal sinus disease. Electronically Signed   By: Duanne Guess D.O.   On: 11/19/2022 16:50   CT Cervical Spine Wo Contrast  Result Date: 11/19/2022 CLINICAL DATA:  Head trauma, moderate-severe; Neck trauma, dangerous injury mechanism (Age 29-64y) EXAM: CT HEAD WITHOUT CONTRAST CT CERVICAL SPINE WITHOUT CONTRAST TECHNIQUE:  Multidetector CT imaging of the head and cervical spine was performed following the standard protocol without intravenous contrast. Multiplanar CT image reconstructions of the cervical spine were also generated. RADIATION DOSE REDUCTION: This exam was performed according to the departmental dose-optimization program which includes automated exposure control, adjustment of the mA and/or kV according to patient size and/or use of iterative reconstruction technique. COMPARISON:  09/29/2022 FINDINGS: CT HEAD FINDINGS Brain: No evidence of acute infarction, hemorrhage, hydrocephalus, extra-axial collection or mass lesion/mass effect. Vascular: No hyperdense vessel or unexpected calcification. Skull: Normal. Negative for fracture or focal lesion. Sinuses/Orbits: Diffuse mucosal thickening of the paranasal sinuses bilaterally, most pronounced within the ethmoid air cells. Other: Negative for scalp hematoma. CT CERVICAL SPINE FINDINGS Alignment: Facet joints are aligned without dislocation or traumatic listhesis. Dens and lateral masses are aligned. Skull base and vertebrae: No acute fracture. No primary bone lesion or focal pathologic process. Soft tissues and spinal canal: No prevertebral fluid or swelling. No visible canal hematoma. Disc levels: Intervertebral disc heights are relatively well preserved with mild facet and uncovertebral arthropathy. Upper chest: Biapical pleuroparenchymal scarring and mild paraseptal emphysema. Other: None. IMPRESSION: 1. No acute intracranial abnormality. 2. No acute fracture or subluxation of the cervical spine. 3. Diffuse paranasal sinus disease. Electronically Signed   By: Duanne Guess D.O.   On: 11/19/2022 16:50   DG Foot Complete Left  Result Date: 11/19/2022 CLINICAL DATA:  Trauma, fall EXAM: LEFT FOOT - COMPLETE 3+ VIEW COMPARISON:  None Available. FINDINGS: No recent fracture or dislocation is seen. There is 2 mm smooth marginated calcification adjacent to the medial  margin of the medial cuneiform seen in the oblique projection. IMPRESSION: No recent fracture or dislocation is seen. 2 mm smooth marginated calcification adjacent to medial cuneiform may suggest old avulsion or ligament calcification from previous injury. Electronically Signed   By: Harlan Stains.D.  On: 11/19/2022 16:31   DG Hip Unilat W or Wo Pelvis 2-3 Views Right  Result Date: 11/19/2022 CLINICAL DATA:  Trauma, fall EXAM: DG HIP (WITH OR WITHOUT PELVIS) 2-3V RIGHT COMPARISON:  None Available. FINDINGS: No recent fracture or dislocation is seen. Phleboliths are seen in pelvis. SI joints are symmetrical. IMPRESSION: No fracture or dislocation is seen. Electronically Signed   By: Elmer Picker M.D.   On: 11/19/2022 16:29   DG Shoulder Right  Result Date: 11/19/2022 CLINICAL DATA:  Trauma, fall EXAM: RIGHT SHOULDER - 2+ VIEW COMPARISON:  05/12/2022 FINDINGS: No fracture or dislocation is seen. There are no abnormal soft tissue calcifications. IMPRESSION: No radiographic abnormalities are seen in right shoulder. Electronically Signed   By: Elmer Picker M.D.   On: 11/19/2022 16:27        Scheduled Meds:  dexamethasone  4 mg Oral Daily   DULoxetine  60 mg Oral Daily   enoxaparin (LOVENOX) injection  40 mg Subcutaneous Q24H   gabapentin  600 mg Oral TID   levETIRAcetam  500 mg Oral BID   mometasone-formoterol  2 puff Inhalation BID   Continuous Infusions:   LOS: 0 days   Time spent= 35 mins    Malisa Ruggiero Arsenio Loader, MD [facility]  If 7PM-7AM, please contact night-coverage  11/20/2022, 12:23 PM

## 2022-11-21 DIAGNOSIS — R451 Restlessness and agitation: Secondary | ICD-10-CM | POA: Diagnosis present

## 2022-11-21 DIAGNOSIS — G40909 Epilepsy, unspecified, not intractable, without status epilepticus: Secondary | ICD-10-CM | POA: Diagnosis not present

## 2022-11-21 DIAGNOSIS — M79675 Pain in left toe(s): Secondary | ICD-10-CM | POA: Diagnosis present

## 2022-11-21 DIAGNOSIS — T380X5A Adverse effect of glucocorticoids and synthetic analogues, initial encounter: Secondary | ICD-10-CM | POA: Diagnosis not present

## 2022-11-21 DIAGNOSIS — J209 Acute bronchitis, unspecified: Secondary | ICD-10-CM | POA: Diagnosis present

## 2022-11-21 DIAGNOSIS — Z7951 Long term (current) use of inhaled steroids: Secondary | ICD-10-CM | POA: Diagnosis not present

## 2022-11-21 DIAGNOSIS — R531 Weakness: Secondary | ICD-10-CM | POA: Diagnosis present

## 2022-11-21 DIAGNOSIS — R262 Difficulty in walking, not elsewhere classified: Secondary | ICD-10-CM | POA: Diagnosis present

## 2022-11-21 DIAGNOSIS — M48061 Spinal stenosis, lumbar region without neurogenic claudication: Secondary | ICD-10-CM | POA: Diagnosis present

## 2022-11-21 DIAGNOSIS — Z91128 Patient's intentional underdosing of medication regimen for other reason: Secondary | ICD-10-CM | POA: Diagnosis not present

## 2022-11-21 DIAGNOSIS — Z87820 Personal history of traumatic brain injury: Secondary | ICD-10-CM | POA: Diagnosis not present

## 2022-11-21 DIAGNOSIS — M5416 Radiculopathy, lumbar region: Secondary | ICD-10-CM | POA: Diagnosis present

## 2022-11-21 DIAGNOSIS — Z87891 Personal history of nicotine dependence: Secondary | ICD-10-CM | POA: Diagnosis not present

## 2022-11-21 DIAGNOSIS — S00512A Abrasion of oral cavity, initial encounter: Secondary | ICD-10-CM | POA: Diagnosis present

## 2022-11-21 DIAGNOSIS — T426X6A Underdosing of other antiepileptic and sedative-hypnotic drugs, initial encounter: Secondary | ICD-10-CM | POA: Diagnosis present

## 2022-11-21 DIAGNOSIS — M7501 Adhesive capsulitis of right shoulder: Secondary | ICD-10-CM | POA: Diagnosis present

## 2022-11-21 DIAGNOSIS — J440 Chronic obstructive pulmonary disease with acute lower respiratory infection: Secondary | ICD-10-CM | POA: Diagnosis present

## 2022-11-21 DIAGNOSIS — Z79899 Other long term (current) drug therapy: Secondary | ICD-10-CM | POA: Diagnosis not present

## 2022-11-21 DIAGNOSIS — D72828 Other elevated white blood cell count: Secondary | ICD-10-CM | POA: Diagnosis not present

## 2022-11-21 DIAGNOSIS — G8929 Other chronic pain: Secondary | ICD-10-CM | POA: Diagnosis present

## 2022-11-21 DIAGNOSIS — Z8249 Family history of ischemic heart disease and other diseases of the circulatory system: Secondary | ICD-10-CM | POA: Diagnosis not present

## 2022-11-21 DIAGNOSIS — M47816 Spondylosis without myelopathy or radiculopathy, lumbar region: Secondary | ICD-10-CM | POA: Diagnosis present

## 2022-11-21 LAB — BASIC METABOLIC PANEL
Anion gap: 10 (ref 5–15)
BUN: 12 mg/dL (ref 6–20)
CO2: 21 mmol/L — ABNORMAL LOW (ref 22–32)
Calcium: 9.2 mg/dL (ref 8.9–10.3)
Chloride: 105 mmol/L (ref 98–111)
Creatinine, Ser: 0.94 mg/dL (ref 0.61–1.24)
GFR, Estimated: >60 mL/min (ref >60)
Glucose, Bld: 105 mg/dL — ABNORMAL HIGH (ref 70–99)
Potassium: 4.1 mmol/L (ref 3.5–5.1)
Sodium: 136 mmol/L (ref 135–145)

## 2022-11-21 LAB — CBC
HCT: 45.7 % (ref 39.0–52.0)
Hemoglobin: 15.4 g/dL (ref 13.0–17.0)
MCH: 31.6 pg (ref 26.0–34.0)
MCHC: 33.7 g/dL (ref 30.0–36.0)
MCV: 93.6 fL (ref 80.0–100.0)
Platelets: 310 10*3/uL (ref 150–400)
RBC: 4.88 MIL/uL (ref 4.22–5.81)
RDW: 13.1 % (ref 11.5–15.5)
WBC: 12.2 10*3/uL — ABNORMAL HIGH (ref 4.0–10.5)
nRBC: 0 % (ref 0.0–0.2)

## 2022-11-21 LAB — MAGNESIUM: Magnesium: 2 mg/dL (ref 1.7–2.4)

## 2022-11-21 MED ORDER — ORAL CARE MOUTH RINSE: 15.0000 mL | OROMUCOSAL | Status: DC | PRN

## 2022-11-21 NOTE — Progress Notes (Signed)
PROGRESS NOTE    Wayne Hawkins  TRZ:[id number] DOB: 1971-10-14 DOA: 11/19/2022 PCP: Adron Bene, MD   Brief Narrative:   51 y.o. 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  Upon admission there was concerns of possible seizure, neurology recommended continuing Keppra/his home meds at time.  Patient reports that while getting CT scan he was dropped from the table leading to lower back pain.  Extensive workup including CT head, cervical, lumbar spine, MRI has been negative for any acute pathology.  Does have some chronic disc bulge.  PT/OT ordered   Assessment & Plan:  Principal Problem:   Seizure disorder (HCC) Active Problems:   Low back pain   COPD (chronic obstructive pulmonary disease) with acute bronchitis (HCC)     Assessment and Plan: * Seizure disorder [facility]) Patient does not necessarily take his Keppra regularly at home.  Could have had breakthrough seizure secondary to noncompliance.  Seen by neurology.  Continue Keppra twice daily  Low back pain Fall at home Patient reports fall at home during seizure activity.  Extensive traumatic imaging workup is reassuring.  Patient has known mild-moderate lumbar spinal stenosis with spondylosis.  Patient reports significant left lower lumbar/hip pain occurring after he was transferred to CT table.  Follow-up CT L-spine was negative for acute lumbar spine fracture, stable lower lumbar spondylosis noted. Extensive imaging including MRI of brain and lower back does not show any acute pathology.  Does have some chronic disc bulge Started on Decadron 4 mg daily, IV Toradol.  Oxycodone for severe pain.  Avoid IV pain medication/narcotics PT/OT UDS-positive for benzodiazepine, opioids and THC  Left toe pain - X-ray negative for any acute pathology.  Uric  acid is normal  Leukocytosis - Secondary to Decadron  COPD (chronic obstructive pulmonary disease) with acute bronchitis (HCC) Stable continue Dulera and albuterol as needed.    Patient needs to progress through physical therapy.  Awaiting their final recommendation      DVT prophylaxis: Lovenox Code Status: Full code Family Communication:    Status is: Observation Patient needs to progress through physical therapy  Subjective: Tells me overall feels extremely weak and having difficulty ambulating.  Examination: Constitutional: Not in acute distress Respiratory: Clear to auscultation bilaterally Cardiovascular: Normal sinus rhythm, no rubs Abdomen: Nontender nondistended good bowel sounds Musculoskeletal: No edema noted Skin: No rashes seen Neurologic: CN 2-12 grossly intact.  And nonfocal Psychiatric: Normal judgment and insight. Alert and oriented x 3. Normal mood.  Objective: Vitals:   11/20/22 1136 11/20/22 2035 11/21/22 0615 11/21/22 0745  BP: (!) 127/93 (!) 149/91 117/75   Pulse: 85 (!) 101 88   Resp: 17 18 18    Temp: 98.1 F (36.7 C) 98.1 F (36.7 C) 97.6 F (36.4 C)   TempSrc:      SpO2: 95% 96% 96% 97%  Weight:      Height:        Intake/Output Summary (Last 24 hours) at 11/21/2022 0753 Last data filed at 11/21/2022 0600 Gross per 24 hour  Intake --  Output 1950 ml  Net -1950 ml   Filed Weights   11/19/22 1449  Weight: 67.6 kg     Data Reviewed:   CBC: Recent Labs  Lab 11/19/22 1457 11/21/22 0545  WBC 7.6 12.2*  NEUTROABS 3.3  --   HGB 14.9 15.4  HCT 44.9  45.7  MCV 94.5 93.6  PLT 316 310   Basic Metabolic Panel: Recent Labs  Lab 11/19/22 1457 11/21/22 0545  NA 138 136  K 4.2 4.1  CL 107 105  CO2 24 21*  GLUCOSE 95 105*  BUN 7 12  CREATININE 0.96 0.94  CALCIUM 8.9 9.2  MG  --  2.0   GFR: Estimated Creatinine Clearance: 87.9 mL/min (by C-G formula based on SCr of 0.94 mg/dL). Liver Function Tests: Recent Labs  Lab  11/19/22 1457  AST 15  ALT 13  ALKPHOS 49  BILITOT 0.6  PROT 7.2  ALBUMIN 4.0   No results for input(s): "LIPASE", "AMYLASE" in the last 168 hours. No results for input(s): "AMMONIA" in the last 168 hours. Coagulation Profile: No results for input(s): "INR", "PROTIME" in the last 168 hours. Cardiac Enzymes: No results for input(s): "CKTOTAL", "CKMB", "CKMBINDEX", "TROPONINI" in the last 168 hours. BNP (last 3 results) No results for input(s): "PROBNP" in the last 8760 hours. HbA1C: No results for input(s): "HGBA1C" in the last 72 hours. CBG: No results for input(s): "GLUCAP" in the last 168 hours. Lipid Profile: No results for input(s): "CHOL", "HDL", "LDLCALC", "TRIG", "CHOLHDL", "LDLDIRECT" in the last 72 hours. Thyroid Function Tests: No results for input(s): "TSH", "T4TOTAL", "FREET4", "T3FREE", "THYROIDAB" in the last 72 hours. Anemia Panel: No results for input(s): "VITAMINB12", "FOLATE", "FERRITIN", "TIBC", "IRON", "RETICCTPCT" in the last 72 hours. Sepsis Labs: No results for input(s): "PROCALCITON", "LATICACIDVEN" in the last 168 hours.  No results found for this or any previous visit (from the past 240 hour(s)).       Radiology Studies: MR BRAIN WO CONTRAST  Result Date: 11/20/2022 CLINICAL DATA:  New onset seizure EXAM: MRI HEAD WITHOUT CONTRAST TECHNIQUE: Multiplanar, multiecho pulse sequences of the brain and surrounding structures were obtained without intravenous contrast. COMPARISON:  07/29/2014 FINDINGS: Brain: No acute infarction, hemorrhage, hydrocephalus, extra-axial collection or mass lesion. No chronic microhemorrhage. Minimal multifocal white matter hyperintense T2-weighted signal. Normal CSF spaces. Midline structures are normal. Vascular: Normal flow voids. Skull and upper cervical spine: Normal marrow signal. Sinuses/Orbits: Moderate paranasal sinus mucosal thickening, greatest in the ethmoid air cells. Normal orbits. Other: None IMPRESSION: 1. No  acute intracranial abnormality. Electronically Signed   By: Deatra Robinson M.D.   On: 11/20/2022 00:22   MR LUMBAR SPINE WO CONTRAST  Result Date: 11/20/2022 CLINICAL DATA:  Low back pain EXAM: MRI LUMBAR SPINE WITHOUT CONTRAST TECHNIQUE: Multiplanar, multisequence MR imaging of the lumbar spine was performed. No intravenous contrast was administered. COMPARISON:  03/26/2022 FINDINGS: Segmentation:  Standard. Alignment:  Physiologic. Vertebrae:  No fracture, evidence of discitis, or bone lesion. Conus medullaris and cauda equina: Conus extends to the L1 level. Conus and cauda equina appear normal. Paraspinal and other soft tissues: Negative. Disc levels: T12-L1: Normal. L1-L2: Normal disc space and facet joints. No spinal canal stenosis. No neural foraminal stenosis. L2-L3: Small right subarticular disc protrusion, decreased in size. Minimal narrowing of the right lateral recess. No central spinal canal stenosis. No neural foraminal stenosis. L3-L4: Unchanged small central disc protrusion. No spinal canal stenosis. No neural foraminal stenosis. L4-L5: Partial involution previously demonstrated right subarticular extrusion. Persistent small bulge. No spinal canal stenosis. No neural foraminal stenosis. L5-S1: Small disc bulge, unchanged. No spinal canal stenosis. No neural foraminal stenosis. Visualized sacrum: Normal. IMPRESSION: 1. No cauda equina compression. 2. Partial involution of previously demonstrated right subarticular disc extrusion at L4-L5. 3. Small right subarticular disc protrusion at L2-L3, decreased in size. 4.  Unchanged small central disc protrusion at L3-L4 and small disc bulge at L5-S1. No spinal canal or neural foraminal stenosis. Electronically Signed   By: Deatra Robinson M.D.   On: 11/20/2022 00:15   CT Lumbar Spine Wo Contrast  Result Date: 11/19/2022 CLINICAL DATA:  Seizure, fell EXAM: CT Lumbar Spine without contrast TECHNIQUE: Technique: Multiplanar CT images of the lumbar spine  were reconstructed from contemporary CT of the Abdomen and Pelvis. RADIATION DOSE REDUCTION: This exam was performed according to the departmental dose-optimization program which includes automated exposure control, adjustment of the mA and/or kV according to patient size and/or use of iterative reconstruction technique. CONTRAST:  No additional COMPARISON:  03/26/2022 FINDINGS: Segmentation: 5 lumbar type vertebrae. Alignment: Normal. Vertebrae: No acute fracture or focal pathologic process. Paraspinal and other soft tissues: Negative. Disc levels: At L3-4 there is circumferential disc bulge and bilateral facet hypertrophy without significant compressive sequela. At L4-5 there is circumferential disc bulge with bilateral facet and ligamentum flavum hypertrophy, with mild-to-moderate central canal stenosis. At L5-S1 there is circumferential disc bulge slightly asymmetric to the left. Bilateral facet hypertrophy. Left lateral recess narrowing with mild symmetrical bilateral neural foraminal encroachment. Reconstructed images demonstrate no additional findings. IMPRESSION: 1. No acute lumbar spine fracture. 2. Stable lower lumbar spondylosis, without significant change since prior MRI. Electronically Signed   By: Sharlet Salina M.D.   On: 11/19/2022 19:12   CT ABDOMEN PELVIS W CONTRAST  Result Date: 11/19/2022 CLINICAL DATA:  Seizure, fell, abdominal trauma EXAM: CT ABDOMEN AND PELVIS WITH CONTRAST TECHNIQUE: Multidetector CT imaging of the abdomen and pelvis was performed using the standard protocol following bolus administration of intravenous contrast. RADIATION DOSE REDUCTION: This exam was performed according to the departmental dose-optimization program which includes automated exposure control, adjustment of the mA and/or kV according to patient size and/or use of iterative reconstruction technique. CONTRAST:  OMNIPAQUE IOHEXOL 300 MG/ML  SOLN COMPARISON:  None Available. FINDINGS: Lower chest: No  acute pleural or parenchymal lung disease. Two left lower lobe pulmonary nodules are identified. Solid nodule image 7/4 measures 4 mm, and a solid nodule image 19/4 measures 3 mm. Hepatobiliary: No focal liver abnormality is seen. No gallstones, gallbladder wall thickening, or biliary dilatation. Pancreas: Unremarkable. No pancreatic ductal dilatation or surrounding inflammatory changes. Spleen: Normal in size without focal abnormality. Adrenals/Urinary Tract: Adrenal glands are unremarkable. Kidneys are normal, without renal calculi, focal lesion, or hydronephrosis. Bladder is significantly distended, without acute abnormality. Stomach/Bowel: No bowel obstruction or ileus. Normal appendix right lower quadrant. No bowel wall thickening or inflammatory change. Vascular/Lymphatic: No significant vascular findings are present. No enlarged abdominal or pelvic lymph nodes. Reproductive: Prostate is unremarkable. Other: No free fluid or free intraperitoneal gas. No abdominal wall hernia. Musculoskeletal: No acute or destructive bony lesions. Reconstructed images demonstrate no additional findings. IMPRESSION: 1. No acute intra-abdominal or intrapelvic process. 2. Multiple pulmonary nodules. Most significant: 4 mm solid pulmonary nodule. Per Fleischner Society Guidelines, no routine follow-up imaging is recommended. These guidelines do not apply to immunocompromised patients and patients with cancer. Follow up in patients with significant comorbidities as clinically warranted. For lung cancer screening, adhere to Lung-RADS guidelines. Reference: Radiology. 2017; 284(1):228-43. Electronically Signed   By: Sharlet Salina M.D.   On: 11/19/2022 19:09   CT Head Wo Contrast  Result Date: 11/19/2022 CLINICAL DATA:  Head trauma, moderate-severe; Neck trauma, dangerous injury mechanism (Age 54-64y) EXAM: CT HEAD WITHOUT CONTRAST CT CERVICAL SPINE WITHOUT CONTRAST TECHNIQUE: Multidetector CT imaging of the head  and cervical  spine was performed following the standard protocol without intravenous contrast. Multiplanar CT image reconstructions of the cervical spine were also generated. RADIATION DOSE REDUCTION: This exam was performed according to the departmental dose-optimization program which includes automated exposure control, adjustment of the mA and/or kV according to patient size and/or use of iterative reconstruction technique. COMPARISON:  09/29/2022 FINDINGS: CT HEAD FINDINGS Brain: No evidence of acute infarction, hemorrhage, hydrocephalus, extra-axial collection or mass lesion/mass effect. Vascular: No hyperdense vessel or unexpected calcification. Skull: Normal. Negative for fracture or focal lesion. Sinuses/Orbits: Diffuse mucosal thickening of the paranasal sinuses bilaterally, most pronounced within the ethmoid air cells. Other: Negative for scalp hematoma. CT CERVICAL SPINE FINDINGS Alignment: Facet joints are aligned without dislocation or traumatic listhesis. Dens and lateral masses are aligned. Skull base and vertebrae: No acute fracture. No primary bone lesion or focal pathologic process. Soft tissues and spinal canal: No prevertebral fluid or swelling. No visible canal hematoma. Disc levels: Intervertebral disc heights are relatively well preserved with mild facet and uncovertebral arthropathy. Upper chest: Biapical pleuroparenchymal scarring and mild paraseptal emphysema. Other: None. IMPRESSION: 1. No acute intracranial abnormality. 2. No acute fracture or subluxation of the cervical spine. 3. Diffuse paranasal sinus disease. Electronically Signed   By: Duanne GuessNicholas  Plundo D.O.   On: 11/19/2022 16:50   CT Cervical Spine Wo Contrast  Result Date: 11/19/2022 CLINICAL DATA:  Head trauma, moderate-severe; Neck trauma, dangerous injury mechanism (Age 52-64y) EXAM: CT HEAD WITHOUT CONTRAST CT CERVICAL SPINE WITHOUT CONTRAST TECHNIQUE: Multidetector CT imaging of the head and cervical spine was performed following the  standard protocol without intravenous contrast. Multiplanar CT image reconstructions of the cervical spine were also generated. RADIATION DOSE REDUCTION: This exam was performed according to the departmental dose-optimization program which includes automated exposure control, adjustment of the mA and/or kV according to patient size and/or use of iterative reconstruction technique. COMPARISON:  09/29/2022 FINDINGS: CT HEAD FINDINGS Brain: No evidence of acute infarction, hemorrhage, hydrocephalus, extra-axial collection or mass lesion/mass effect. Vascular: No hyperdense vessel or unexpected calcification. Skull: Normal. Negative for fracture or focal lesion. Sinuses/Orbits: Diffuse mucosal thickening of the paranasal sinuses bilaterally, most pronounced within the ethmoid air cells. Other: Negative for scalp hematoma. CT CERVICAL SPINE FINDINGS Alignment: Facet joints are aligned without dislocation or traumatic listhesis. Dens and lateral masses are aligned. Skull base and vertebrae: No acute fracture. No primary bone lesion or focal pathologic process. Soft tissues and spinal canal: No prevertebral fluid or swelling. No visible canal hematoma. Disc levels: Intervertebral disc heights are relatively well preserved with mild facet and uncovertebral arthropathy. Upper chest: Biapical pleuroparenchymal scarring and mild paraseptal emphysema. Other: None. IMPRESSION: 1. No acute intracranial abnormality. 2. No acute fracture or subluxation of the cervical spine. 3. Diffuse paranasal sinus disease. Electronically Signed   By: Duanne GuessNicholas  Plundo D.O.   On: 11/19/2022 16:50   DG Foot Complete Left  Result Date: 11/19/2022 CLINICAL DATA:  Trauma, fall EXAM: LEFT FOOT - COMPLETE 3+ VIEW COMPARISON:  None Available. FINDINGS: No recent fracture or dislocation is seen. There is 2 mm smooth marginated calcification adjacent to the medial margin of the medial cuneiform seen in the oblique projection. IMPRESSION: No recent  fracture or dislocation is seen. 2 mm smooth marginated calcification adjacent to medial cuneiform may suggest old avulsion or ligament calcification from previous injury. Electronically Signed   By: Ernie AvenaPalani  Rathinasamy M.D.   On: 11/19/2022 16:31   DG Hip Unilat W or Wo Pelvis 2-3 Views Right  Result Date: 11/19/2022 CLINICAL DATA:  Trauma, fall EXAM: DG HIP (WITH OR WITHOUT PELVIS) 2-3V RIGHT COMPARISON:  None Available. FINDINGS: No recent fracture or dislocation is seen. Phleboliths are seen in pelvis. SI joints are symmetrical. IMPRESSION: No fracture or dislocation is seen. Electronically Signed   By: Ernie Avena M.D.   On: 11/19/2022 16:29   DG Shoulder Right  Result Date: 11/19/2022 CLINICAL DATA:  Trauma, fall EXAM: RIGHT SHOULDER - 2+ VIEW COMPARISON:  05/12/2022 FINDINGS: No fracture or dislocation is seen. There are no abnormal soft tissue calcifications. IMPRESSION: No radiographic abnormalities are seen in right shoulder. Electronically Signed   By: Ernie Avena M.D.   On: 11/19/2022 16:27        Scheduled Meds:  budesonide-formoterol  2 puff Inhalation BID   dexamethasone  4 mg Oral Daily   DULoxetine  60 mg Oral Daily   enoxaparin (LOVENOX) injection  40 mg Subcutaneous Q24H   gabapentin  600 mg Oral TID   ketorolac  30 mg Intravenous Q8H   levETIRAcetam  500 mg Oral BID   Continuous Infusions:   LOS: 0 days   Time spent= 35 mins    Macaria Bias Joline Maxcy, MD [facility]  If 7PM-7AM, please contact night-coverage  11/21/2022, 7:53 AM

## 2022-11-22 DIAGNOSIS — G40909 Epilepsy, unspecified, not intractable, without status epilepticus: Secondary | ICD-10-CM | POA: Diagnosis not present

## 2022-11-22 LAB — CBC
HCT: 47.4 % (ref 39.0–52.0)
Hemoglobin: 15.5 g/dL (ref 13.0–17.0)
MCH: 30.9 pg (ref 26.0–34.0)
MCHC: 32.7 g/dL (ref 30.0–36.0)
MCV: 94.4 fL (ref 80.0–100.0)
Platelets: 306 10*3/uL (ref 150–400)
RBC: 5.02 MIL/uL (ref 4.22–5.81)
RDW: 13.3 % (ref 11.5–15.5)
WBC: 7.8 10*3/uL (ref 4.0–10.5)
nRBC: 0 % (ref 0.0–0.2)

## 2022-11-22 LAB — BASIC METABOLIC PANEL
Anion gap: 9 (ref 5–15)
BUN: 14 mg/dL (ref 6–20)
CO2: 27 mmol/L (ref 22–32)
Calcium: 9.6 mg/dL (ref 8.9–10.3)
Chloride: 102 mmol/L (ref 98–111)
Creatinine, Ser: 0.93 mg/dL (ref 0.61–1.24)
GFR, Estimated: >60 mL/min (ref >60)
Glucose, Bld: 147 mg/dL — ABNORMAL HIGH (ref 70–99)
Potassium: 4.4 mmol/L (ref 3.5–5.1)
Sodium: 138 mmol/L (ref 135–145)

## 2022-11-22 LAB — MAGNESIUM: Magnesium: 2.1 mg/dL (ref 1.7–2.4)

## 2022-11-22 MED ORDER — PREDNISONE 20 MG PO TABS
40.0000 mg | ORAL_TABLET | Freq: Every day | ORAL | 0 refills | Status: AC
  Filled 2022-11-22: qty 6, 3d supply, fill #0

## 2022-11-22 NOTE — Discharge Summary (Signed)
Physician Discharge Summary  Wayne Hawkins GBT:[id number] DOB: [birth year] DOA: 11/19/2022  PCP: Adron Bene, MD  Admit date: 11/19/2022 Discharge date: 11/22/2022  Admitted From: Home Disposition:  Home  Recommendations for Outpatient Follow-up:  Follow up with PCP in 1 week Please obtain CBC in 1 week to ensure resolution of leukocytosis.  Please follow up on the following pending results: Keppra level  Home Health: No  Equipment/Devices: None   Discharge Condition: Good CODE STATUS: Full  Diet recommendation: General  Brief/Interim Summary: From H&P: From Dr. Allena Katz: "Wayne Hawkins is a 51 y.o. 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   History is difficult to obtain from patient due to cooperation.  He has been agitated with aggressive and threatening behavior while in the ED.  He is awake, alert, oriented and speaking in full clear sentences but speech is tangential and does not answer questions directly.   Patient states he fell today when he was attempting to stand up.  He says he hit his head on the ground as well as his right shoulder which was previously diagnosed as a frozen shoulder.  He believes he had a seizure similar to prior.  He states he does have his prescription for Keppra and medication at home.  Does not answer directly if he has been taking it regularly but reported to the ED physician that he did not take his dose today and that he misses it about twice weekly.   He says while he was being transferred out of bed to the CT table for his imaging he was dropped onto his lower back and has been having left lower back/hip pain.   Physical exam is also limited due to cooperation.  Strength is equal upper extremities.  RLE strength is intact.  He is able to lift his left lower  extremity against gravity but does not allow testing against resistance."  Interim: Neurology saw the patient and did not think any neurologic injury took place.  Imaging was unremarkable.  Patient worked with physical therapy who did not recommend any further home intervention.  He did have back pain throughout the stay that improved with physical therapy.  Subjective on day of discharge: Patient reports feeling very good with very minimal pain.  He feels embarrassed about missing medication doses and vows that he will not do this again.  He is eager to go home.  Discharge Diagnoses:  Principal Problem:   Seizure disorder (HCC)  Resume Keppra 500 mg twice daily, compliance encouraged  Neurology was consulted, no further recommendations  MRI unremarkable Active Problems:   COPD (chronic obstructive pulmonary disease) with acute bronchitis (HCC)  Resume home Dulera, albuterol as needed    Low back pain  He will finish a few more days of prednisone  May need outpatient physical therapy if this reoccurs   Discharge Instructions  Discharge Instructions     Call MD for:  extreme fatigue   Complete by: As directed    Call MD for:  severe uncontrolled pain   Complete by: As directed    Diet - low sodium heart healthy   Complete by: As directed    Increase activity slowly   Complete by: As directed       Allergies as of 11/22/2022   No Known Allergies      Medication List  TAKE these medications    albuterol 108 (90 Base) MCG/ACT inhaler Commonly known as: VENTOLIN HFA Inhale 2 puffs into the lungs every 6 (six) hours as needed for wheezing or shortness of breath.   budesonide-formoterol 160-4.5 MCG/ACT inhaler Commonly known as: SYMBICORT Inhale 2 puffs into the lungs 2 (two) times daily.   cyclobenzaprine 10 MG tablet Commonly known as: FLEXERIL Take 10 mg by mouth 3 (three) times daily as needed for muscle spasms.   diphenhydrAMINE 25 MG tablet Commonly known  as: BENADRYL Take 50 mg by mouth at bedtime as needed for allergies.   diphenhydramine-acetaminophen 25-500 MG Tabs tablet Commonly known as: TYLENOL PM Take 2 tablets by mouth at bedtime as needed (sleep).   DULoxetine 60 MG capsule Commonly known as: Cymbalta Take 1 capsule (60 mg total) by mouth daily.   gabapentin 600 MG tablet Commonly known as: Neurontin Take 1 tablet (600 mg total) by mouth 3 (three) times daily.   levETIRAcetam 500 MG tablet Commonly known as: Keppra Take 1 tablet (500 mg total) by mouth 2 (two) times daily.   loratadine 10 MG tablet Commonly known as: CLARITIN Take 10 mg by mouth daily as needed for allergies or rhinitis.   naproxen 375 MG tablet Commonly known as: NAPROSYN Take 1 tablet twice daily as needed for shoulder pain.   predniSONE 20 MG tablet Commonly known as: DELTASONE Take 2 tablets (40 mg total) by mouth daily with breakfast for 3 days. Start taking on: November 23, 2022   Valtoco 10 MG Dose 10 MG/0.1ML Liqd Generic drug: diazePAM Place 20 mg into the nose as needed for active seizure        No Known Allergies  Consultations: Neurology  Procedures/Studies: MR BRAIN WO CONTRAST  Result Date: 11/20/2022 CLINICAL DATA:  New onset seizure EXAM: MRI HEAD WITHOUT CONTRAST TECHNIQUE: Multiplanar, multiecho pulse sequences of the brain and surrounding structures were obtained without intravenous contrast. COMPARISON:  07/29/2014 FINDINGS: Brain: No acute infarction, hemorrhage, hydrocephalus, extra-axial collection or mass lesion. No chronic microhemorrhage. Minimal multifocal white matter hyperintense T2-weighted signal. Normal CSF spaces. Midline structures are normal. Vascular: Normal flow voids. Skull and upper cervical spine: Normal marrow signal. Sinuses/Orbits: Moderate paranasal sinus mucosal thickening, greatest in the ethmoid air cells. Normal orbits. Other: None IMPRESSION: 1. No acute intracranial abnormality. Electronically  Signed   By: Ulyses Jarred M.D.   On: 11/20/2022 00:22   MR LUMBAR SPINE WO CONTRAST  Result Date: 11/20/2022 CLINICAL DATA:  Low back pain EXAM: MRI LUMBAR SPINE WITHOUT CONTRAST TECHNIQUE: Multiplanar, multisequence MR imaging of the lumbar spine was performed. No intravenous contrast was administered. COMPARISON:  03/26/2022 FINDINGS: Segmentation:  Standard. Alignment:  Physiologic. Vertebrae:  No fracture, evidence of discitis, or bone lesion. Conus medullaris and cauda equina: Conus extends to the L1 level. Conus and cauda equina appear normal. Paraspinal and other soft tissues: Negative. Disc levels: T12-L1: Normal. L1-L2: Normal disc space and facet joints. No spinal canal stenosis. No neural foraminal stenosis. L2-L3: Small right subarticular disc protrusion, decreased in size. Minimal narrowing of the right lateral recess. No central spinal canal stenosis. No neural foraminal stenosis. L3-L4: Unchanged small central disc protrusion. No spinal canal stenosis. No neural foraminal stenosis. L4-L5: Partial involution previously demonstrated right subarticular extrusion. Persistent small bulge. No spinal canal stenosis. No neural foraminal stenosis. L5-S1: Small disc bulge, unchanged. No spinal canal stenosis. No neural foraminal stenosis. Visualized sacrum: Normal. IMPRESSION: 1. No cauda equina compression. 2. Partial involution of previously demonstrated right  subarticular disc extrusion at L4-L5. 3. Small right subarticular disc protrusion at L2-L3, decreased in size. 4. Unchanged small central disc protrusion at L3-L4 and small disc bulge at L5-S1. No spinal canal or neural foraminal stenosis. Electronically Signed   By: Ulyses Jarred M.D.   On: 11/20/2022 00:15   CT Lumbar Spine Wo Contrast  Result Date: 11/19/2022 CLINICAL DATA:  Seizure, fell EXAM: CT Lumbar Spine without contrast TECHNIQUE: Technique: Multiplanar CT images of the lumbar spine were reconstructed from contemporary CT of the  Abdomen and Pelvis. RADIATION DOSE REDUCTION: This exam was performed according to the departmental dose-optimization program which includes automated exposure control, adjustment of the mA and/or kV according to patient size and/or use of iterative reconstruction technique. CONTRAST:  No additional COMPARISON:  03/26/2022 FINDINGS: Segmentation: 5 lumbar type vertebrae. Alignment: Normal. Vertebrae: No acute fracture or focal pathologic process. Paraspinal and other soft tissues: Negative. Disc levels: At L3-4 there is circumferential disc bulge and bilateral facet hypertrophy without significant compressive sequela. At L4-5 there is circumferential disc bulge with bilateral facet and ligamentum flavum hypertrophy, with mild-to-moderate central canal stenosis. At L5-S1 there is circumferential disc bulge slightly asymmetric to the left. Bilateral facet hypertrophy. Left lateral recess narrowing with mild symmetrical bilateral neural foraminal encroachment. Reconstructed images demonstrate no additional findings. IMPRESSION: 1. No acute lumbar spine fracture. 2. Stable lower lumbar spondylosis, without significant change since prior MRI. Electronically Signed   By: Randa Ngo M.D.   On: 11/19/2022 19:12   CT ABDOMEN PELVIS W CONTRAST  Result Date: 11/19/2022 CLINICAL DATA:  Seizure, fell, abdominal trauma EXAM: CT ABDOMEN AND PELVIS WITH CONTRAST TECHNIQUE: Multidetector CT imaging of the abdomen and pelvis was performed using the standard protocol following bolus administration of intravenous contrast. RADIATION DOSE REDUCTION: This exam was performed according to the departmental dose-optimization program which includes automated exposure control, adjustment of the mA and/or kV according to patient size and/or use of iterative reconstruction technique. CONTRAST:  173mL OMNIPAQUE IOHEXOL 300 MG/ML  SOLN COMPARISON:  None Available. FINDINGS: Lower chest: No acute pleural or parenchymal lung disease. Two  left lower lobe pulmonary nodules are identified. Solid nodule image 7/4 measures 4 mm, and a solid nodule image 19/4 measures 3 mm. Hepatobiliary: No focal liver abnormality is seen. No gallstones, gallbladder wall thickening, or biliary dilatation. Pancreas: Unremarkable. No pancreatic ductal dilatation or surrounding inflammatory changes. Spleen: Normal in size without focal abnormality. Adrenals/Urinary Tract: Adrenal glands are unremarkable. Kidneys are normal, without renal calculi, focal lesion, or hydronephrosis. Bladder is significantly distended, without acute abnormality. Stomach/Bowel: No bowel obstruction or ileus. Normal appendix right lower quadrant. No bowel wall thickening or inflammatory change. Vascular/Lymphatic: No significant vascular findings are present. No enlarged abdominal or pelvic lymph nodes. Reproductive: Prostate is unremarkable. Other: No free fluid or free intraperitoneal gas. No abdominal wall hernia. Musculoskeletal: No acute or destructive bony lesions. Reconstructed images demonstrate no additional findings. IMPRESSION: 1. No acute intra-abdominal or intrapelvic process. 2. Multiple pulmonary nodules. Most significant: 4 mm solid pulmonary nodule. Per Fleischner Society Guidelines, no routine follow-up imaging is recommended. These guidelines do not apply to immunocompromised patients and patients with cancer. Follow up in patients with significant comorbidities as clinically warranted. For lung cancer screening, adhere to Lung-RADS guidelines. Reference: Radiology. 2017; 284(1):228-43. Electronically Signed   By: Randa Ngo M.D.   On: 11/19/2022 19:09   CT Head Wo Contrast  Result Date: 11/19/2022 CLINICAL DATA:  Head trauma, moderate-severe; Neck trauma, dangerous injury mechanism (Age 76-64y)  EXAM: CT HEAD WITHOUT CONTRAST CT CERVICAL SPINE WITHOUT CONTRAST TECHNIQUE: Multidetector CT imaging of the head and cervical spine was performed following the standard  protocol without intravenous contrast. Multiplanar CT image reconstructions of the cervical spine were also generated. RADIATION DOSE REDUCTION: This exam was performed according to the departmental dose-optimization program which includes automated exposure control, adjustment of the mA and/or kV according to patient size and/or use of iterative reconstruction technique. COMPARISON:  09/29/2022 FINDINGS: CT HEAD FINDINGS Brain: No evidence of acute infarction, hemorrhage, hydrocephalus, extra-axial collection or mass lesion/mass effect. Vascular: No hyperdense vessel or unexpected calcification. Skull: Normal. Negative for fracture or focal lesion. Sinuses/Orbits: Diffuse mucosal thickening of the paranasal sinuses bilaterally, most pronounced within the ethmoid air cells. Other: Negative for scalp hematoma. CT CERVICAL SPINE FINDINGS Alignment: Facet joints are aligned without dislocation or traumatic listhesis. Dens and lateral masses are aligned. Skull base and vertebrae: No acute fracture. No primary bone lesion or focal pathologic process. Soft tissues and spinal canal: No prevertebral fluid or swelling. No visible canal hematoma. Disc levels: Intervertebral disc heights are relatively well preserved with mild facet and uncovertebral arthropathy. Upper chest: Biapical pleuroparenchymal scarring and mild paraseptal emphysema. Other: None. IMPRESSION: 1. No acute intracranial abnormality. 2. No acute fracture or subluxation of the cervical spine. 3. Diffuse paranasal sinus disease. Electronically Signed   By: Davina Poke D.O.   On: 11/19/2022 16:50   CT Cervical Spine Wo Contrast  Result Date: 11/19/2022 CLINICAL DATA:  Head trauma, moderate-severe; Neck trauma, dangerous injury mechanism (Age 36-64y) EXAM: CT HEAD WITHOUT CONTRAST CT CERVICAL SPINE WITHOUT CONTRAST TECHNIQUE: Multidetector CT imaging of the head and cervical spine was performed following the standard protocol without intravenous  contrast. Multiplanar CT image reconstructions of the cervical spine were also generated. RADIATION DOSE REDUCTION: This exam was performed according to the departmental dose-optimization program which includes automated exposure control, adjustment of the mA and/or kV according to patient size and/or use of iterative reconstruction technique. COMPARISON:  09/29/2022 FINDINGS: CT HEAD FINDINGS Brain: No evidence of acute infarction, hemorrhage, hydrocephalus, extra-axial collection or mass lesion/mass effect. Vascular: No hyperdense vessel or unexpected calcification. Skull: Normal. Negative for fracture or focal lesion. Sinuses/Orbits: Diffuse mucosal thickening of the paranasal sinuses bilaterally, most pronounced within the ethmoid air cells. Other: Negative for scalp hematoma. CT CERVICAL SPINE FINDINGS Alignment: Facet joints are aligned without dislocation or traumatic listhesis. Dens and lateral masses are aligned. Skull base and vertebrae: No acute fracture. No primary bone lesion or focal pathologic process. Soft tissues and spinal canal: No prevertebral fluid or swelling. No visible canal hematoma. Disc levels: Intervertebral disc heights are relatively well preserved with mild facet and uncovertebral arthropathy. Upper chest: Biapical pleuroparenchymal scarring and mild paraseptal emphysema. Other: None. IMPRESSION: 1. No acute intracranial abnormality. 2. No acute fracture or subluxation of the cervical spine. 3. Diffuse paranasal sinus disease. Electronically Signed   By: Davina Poke D.O.   On: 11/19/2022 16:50   DG Foot Complete Left  Result Date: 11/19/2022 CLINICAL DATA:  Trauma, fall EXAM: LEFT FOOT - COMPLETE 3+ VIEW COMPARISON:  None Available. FINDINGS: No recent fracture or dislocation is seen. There is 2 mm smooth marginated calcification adjacent to the medial margin of the medial cuneiform seen in the oblique projection. IMPRESSION: No recent fracture or dislocation is seen. 2 mm  smooth marginated calcification adjacent to medial cuneiform may suggest old avulsion or ligament calcification from previous injury. Electronically Signed   By: Prudy Feeler.D.  On: 11/19/2022 16:31   DG Hip Unilat W or Wo Pelvis 2-3 Views Right  Result Date: 11/19/2022 CLINICAL DATA:  Trauma, fall EXAM: DG HIP (WITH OR WITHOUT PELVIS) 2-3V RIGHT COMPARISON:  None Available. FINDINGS: No recent fracture or dislocation is seen. Phleboliths are seen in pelvis. SI joints are symmetrical. IMPRESSION: No fracture or dislocation is seen. Electronically Signed   By: Elmer Picker M.D.   On: 11/19/2022 16:29   DG Shoulder Right  Result Date: 11/19/2022 CLINICAL DATA:  Trauma, fall EXAM: RIGHT SHOULDER - 2+ VIEW COMPARISON:  05/12/2022 FINDINGS: No fracture or dislocation is seen. There are no abnormal soft tissue calcifications. IMPRESSION: No radiographic abnormalities are seen in right shoulder. Electronically Signed   By: Elmer Picker M.D.   On: 11/19/2022 16:27    Discharge Exam: Vitals:   11/21/22 2030 11/22/22 0500  BP: (!) 127/95 122/87  Pulse: 80 90  Resp: 20 18  Temp: 97.8 F (36.6 C) 97.7 F (36.5 C)  SpO2: 97% 95%     General: Pt is alert, awake, not in acute distress Cardiovascular: RRR, S1/S2 +, no edema Respiratory: CTA bilaterally, no wheezing, no rhonchi, no respiratory distress, no conversational dyspnea  Abdominal: Soft, NT, ND, bowel sounds + MSK: Mild TTP over the left lumbar erector spinae muscle group on the left, no paraspinal musculature TTP or midline tenderness Psych: Normal mood and affect, stable judgement and insight   The results of significant diagnostics from this hospitalization (including imaging, microbiology, ancillary and laboratory) are listed below for reference.    Labs: Basic Metabolic Panel: Recent Labs  Lab 11/19/22 1457 11/21/22 0545 11/22/22 0600  NA 138 136 138  K 4.2 4.1 4.4  CL 107 105 102  CO2 24 21* 27   GLUCOSE 95 105* 147*  BUN 7 12 14   CREATININE 0.96 0.94 0.93  CALCIUM 8.9 9.2 9.6  MG  --  2.0 2.1   Liver Function Tests: Recent Labs  Lab 11/19/22 1457  AST 15  ALT 13  ALKPHOS 49  BILITOT 0.6  PROT 7.2  ALBUMIN 4.0   CBC: Recent Labs  Lab 11/19/22 1457 11/21/22 0545 11/22/22 0600  WBC 7.6 12.2* 7.8  NEUTROABS 3.3  --   --   HGB 14.9 15.4 15.5  HCT 44.9 45.7 47.4  MCV 94.5 93.6 94.4  PLT 316 310 306   Urinalysis    Component Value Date/Time   COLORURINE YELLOW 11/20/2022 1010   APPEARANCEUR CLEAR 11/20/2022 1010   LABSPEC 1.010 11/20/2022 1010   PHURINE 7.0 11/20/2022 1010   GLUCOSEU NEGATIVE 11/20/2022 1010   HGBUR NEGATIVE 11/20/2022 1010   BILIRUBINUR NEGATIVE 11/20/2022 1010   KETONESUR 15 (A) 11/20/2022 1010   PROTEINUR NEGATIVE 11/20/2022 1010   UROBILINOGEN 0.2 07/28/2014 1116   NITRITE NEGATIVE 11/20/2022 1010   LEUKOCYTESUR NEGATIVE 11/20/2022 1010   Sepsis Labs Recent Labs  Lab 11/19/22 1457 11/21/22 0545 11/22/22 0600  WBC 7.6 12.2* 7.8   Patient was seen and examined on the day of discharge and was found to be in stable condition. Time coordinating discharge: 35 minutes including assessment and coordination of care, as well as examination of the patient.   SIGNED:  Shelda Pal, DO [facility] 11/22/2022, 2:12 PM

## 2022-11-22 NOTE — Discharge Instructions (Signed)

## 2022-11-22 NOTE — Plan of Care (Signed)
  Problem: Education: Goal: Knowledge of General Education information will improve Description: Including pain rating scale, medication(s)/side effects and non-pharmacologic comfort measures Outcome: Progressing   Problem: Health Behavior/Discharge Planning: Goal: Ability to manage health-related needs will improve Outcome: Progressing   Problem: Clinical Measurements: Goal: Ability to maintain clinical measurements within normal limits will improve Outcome: Progressing Goal: Will remain free from infection Outcome: Progressing Goal: Diagnostic test results will improve Outcome: Progressing Goal: Respiratory complications will improve Outcome: Progressing Goal: Cardiovascular complication will be avoided Outcome: Progressing   Problem: Activity: Goal: Risk for activity intolerance will decrease Outcome: Progressing   Problem: Nutrition: Goal: Adequate nutrition will be maintained Outcome: Progressing   Problem: Coping: Goal: Level of anxiety will decrease Outcome: Progressing   Problem: Elimination: Goal: Will not experience complications related to bowel motility Outcome: Progressing Goal: Will not experience complications related to urinary retention Outcome: Progressing   Problem: Pain Managment: Goal: General experience of comfort will improve Outcome: Progressing   Problem: Safety: Goal: Ability to remain free from injury will improve Outcome: Progressing   Problem: Skin Integrity: Goal: Risk for impaired skin integrity will decrease Outcome: Progressing   Problem: Education: Goal: Utilization of techniques to improve thought processes will improve Outcome: Progressing Goal: Knowledge of the prescribed therapeutic regimen will improve Outcome: Progressing   Problem: Activity: Goal: Interest or engagement in leisure activities will improve Outcome: Progressing Goal: Imbalance in normal sleep/wake cycle will improve Outcome: Progressing   Problem:  Coping: Goal: Coping ability will improve Outcome: Progressing Goal: Will verbalize feelings Outcome: Progressing   Problem: Health Behavior/Discharge Planning: Goal: Ability to make decisions will improve Outcome: Progressing Goal: Compliance with therapeutic regimen will improve Outcome: Progressing   Problem: Role Relationship: Goal: Will demonstrate positive changes in social behaviors and relationships Outcome: Progressing   Problem: Safety: Goal: Ability to disclose and discuss suicidal ideas will improve Outcome: Progressing Goal: Ability to identify and utilize support systems that promote safety will improve Outcome: Progressing   Problem: Self-Concept: Goal: Will verbalize positive feelings about self Outcome: Progressing Goal: Level of anxiety will decrease Outcome: Progressing   

## 2022-11-22 NOTE — Progress Notes (Signed)
Physical Therapy Treatment Patient Details Name: Wayne Hawkins MRN: [id number] DOB: March 15, 1971 Today's Date: 11/22/2022   History of Present Illness Mr. Krakowski is a 51 yr old male brought to the hospital after a suspected seizure episode at home with an associated fall. 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  Pt lives with a Friend past 22 years.  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 Assisted OOB to amb a great distance in hallway.  No overt LOB.  Trace back pain.  "Much better". Pt hopes to D/C later today.  No follow up PT needed.    Recommendations for follow up therapy are one component of a multi-disciplinary discharge planning process, led by the attending physician.  Recommendations may be updated based on patient status, additional functional criteria and insurance authorization.  Follow Up Recommendations        Assistance Recommended at Discharge    Patient can return home with the following Help with stairs or ramp for entrance;Assist for transportation;Assistance with cooking/housework;A little help with bathing/dressing/bathroom;A little help with walking and/or transfers   Equipment Recommendations  None recommended by PT    Recommendations for Other Services       Precautions / Restrictions Precautions Precautions: Fall Precaution Comments: Hx of falling Restrictions Weight Bearing Restrictions: No Other Position/Activity Restrictions: seizure precautions     Mobility  Bed Mobility Overal bed mobility: Modified Independent             General bed mobility comments: self able    Transfers Overall transfer level: Modified independent                 General transfer comment: self able    Ambulation/Gait Ambulation/Gait assistance: Modified independent (Device/Increase time), Supervision Gait Distance  (Feet): 350 Feet Assistive device: None Gait Pattern/deviations: Step-through pattern Gait velocity: WNL     General Gait Details: tolerated a functional distance with NO AD and minimal c/o back pain.  Feeling "much better".   Stairs             Wheelchair Mobility    Modified Rankin (Stroke Patients Only)       Balance                                            Cognition Arousal/Alertness: Awake/alert Behavior During Therapy: WFL for tasks assessed/performed Overall Cognitive Status: Within Functional Limits for tasks assessed                                 General Comments: AxO x 3 feeling "much better" and eager to go home.  Pt lives with a Friend past 22 years.  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        Exercises      General Comments        Pertinent Vitals/Pain Pain Assessment Pain Assessment: Faces Faces Pain Scale: Hurts a little bit Pain Location: pain is "much improved" stated pt Pain Descriptors / Indicators: Aching Pain Intervention(s): Monitored during session    Home Living  Prior Function            PT Goals (current goals can now be found in the care plan section) Progress towards PT goals: Progressing toward goals    Frequency           PT Plan Current plan remains appropriate    Co-evaluation              AM-PAC PT "6 Clicks" Mobility   Outcome Measure  Help needed turning from your back to your side while in a flat bed without using bedrails?: None Help needed moving from lying on your back to sitting on the side of a flat bed without using bedrails?: None Help needed moving to and from a bed to a chair (including a wheelchair)?: None Help needed standing up from a chair using your arms (e.g., wheelchair or bedside chair)?: None Help needed to walk in hospital room?: None Help needed climbing 3-5 steps with a railing? : None 6 Click  Score: 24    End of Session Equipment Utilized During Treatment: Gait belt   Patient left: in bed;with call bell/phone within reach Nurse Communication: Mobility status PT Visit Diagnosis: Pain;Difficulty in walking, not elsewhere classified (R26.2);History of falling (Z91.81)     Time: 8563-1497 PT Time Calculation (min) (ACUTE ONLY): 27 min  Charges:  $Gait Training: 8-22 mins $Therapeutic Activity: 8-22 mins                     Felecia Shelling  PTA Acute  Rehabilitation Services Office M-F          [phone] Weekend pager [phone]

## 2022-11-23 ENCOUNTER — Other Ambulatory Visit (HOSPITAL_COMMUNITY): Payer: Self-pay

## 2022-11-23 ENCOUNTER — Other Ambulatory Visit: Payer: Self-pay

## 2022-11-23 LAB — LEVETIRACETAM LEVEL: Levetiracetam Lvl: 13.1 ug/mL (ref 10.0–40.0)

## 2022-11-24 ENCOUNTER — Observation Stay (HOSPITAL_COMMUNITY)
Admission: EM | Admit: 2022-11-24 | Discharge: 2022-11-25 | Discharge status: Against Medical Advice | Payer: Medicaid Other | Attending: Internal Medicine | Admitting: Internal Medicine

## 2022-11-24 ENCOUNTER — Emergency Department (HOSPITAL_COMMUNITY): Payer: Medicaid Other

## 2022-11-24 ENCOUNTER — Other Ambulatory Visit: Payer: Self-pay

## 2022-11-24 ENCOUNTER — Encounter (HOSPITAL_COMMUNITY): Payer: Self-pay | Admitting: Internal Medicine

## 2022-11-24 ENCOUNTER — Observation Stay (HOSPITAL_COMMUNITY): Payer: Medicaid Other

## 2022-11-24 DIAGNOSIS — W182XXA Fall in (into) shower or empty bathtub, initial encounter: Secondary | ICD-10-CM | POA: Insufficient documentation

## 2022-11-24 DIAGNOSIS — S22048A Other fracture of fourth thoracic vertebra, initial encounter for closed fracture: Secondary | ICD-10-CM | POA: Diagnosis not present

## 2022-11-24 DIAGNOSIS — J45909 Unspecified asthma, uncomplicated: Secondary | ICD-10-CM | POA: Diagnosis not present

## 2022-11-24 DIAGNOSIS — J449 Chronic obstructive pulmonary disease, unspecified: Secondary | ICD-10-CM | POA: Diagnosis present

## 2022-11-24 DIAGNOSIS — Y92002 Bathroom of unspecified non-institutional (private) residence single-family (private) house as the place of occurrence of the external cause: Secondary | ICD-10-CM | POA: Diagnosis not present

## 2022-11-24 DIAGNOSIS — E876 Hypokalemia: Secondary | ICD-10-CM

## 2022-11-24 DIAGNOSIS — Z87891 Personal history of nicotine dependence: Secondary | ICD-10-CM | POA: Insufficient documentation

## 2022-11-24 DIAGNOSIS — S22040A Wedge compression fracture of fourth thoracic vertebra, initial encounter for closed fracture: Secondary | ICD-10-CM | POA: Diagnosis present

## 2022-11-24 DIAGNOSIS — R569 Unspecified convulsions: Secondary | ICD-10-CM | POA: Diagnosis present

## 2022-11-24 DIAGNOSIS — G40909 Epilepsy, unspecified, not intractable, without status epilepticus: Principal | ICD-10-CM

## 2022-11-24 DIAGNOSIS — Z79899 Other long term (current) drug therapy: Secondary | ICD-10-CM | POA: Insufficient documentation

## 2022-11-24 LAB — RAPID URINE DRUG SCREEN, HOSP PERFORMED
Amphetamines: NOT DETECTED
Barbiturates: NOT DETECTED
Benzodiazepines: POSITIVE — AB
Cocaine: NOT DETECTED
Opiates: NOT DETECTED
Tetrahydrocannabinol: POSITIVE — AB

## 2022-11-24 LAB — BASIC METABOLIC PANEL
Anion gap: 9 (ref 5–15)
BUN: 11 mg/dL (ref 6–20)
CO2: 19 mmol/L — ABNORMAL LOW (ref 22–32)
Calcium: 6 mg/dL — CL (ref 8.9–10.3)
Chloride: 108 mmol/L (ref 98–111)
Creatinine, Ser: 0.77 mg/dL (ref 0.61–1.24)
GFR, Estimated: >60 mL/min (ref >60)
Glucose, Bld: 88 mg/dL (ref 70–99)
Potassium: 2.2 mmol/L — CL (ref 3.5–5.1)
Sodium: 136 mmol/L (ref 135–145)

## 2022-11-24 LAB — CBC WITH DIFFERENTIAL/PLATELET
Abs Immature Granulocytes: 0.02 10*3/uL (ref 0.00–0.07)
Basophils Absolute: 0 10*3/uL (ref 0.0–0.1)
Basophils Relative: 0 %
Eosinophils Absolute: 0 10*3/uL (ref 0.0–0.5)
Eosinophils Relative: 0 %
HCT: 30.9 % — ABNORMAL LOW (ref 39.0–52.0)
Hemoglobin: 9.9 g/dL — ABNORMAL LOW (ref 13.0–17.0)
Immature Granulocytes: 0 %
Lymphocytes Relative: 13 %
Lymphs Abs: 0.9 10*3/uL (ref 0.7–4.0)
MCH: 31.9 pg (ref 26.0–34.0)
MCHC: 32 g/dL (ref 30.0–36.0)
MCV: 99.7 fL (ref 80.0–100.0)
Monocytes Absolute: 0.9 10*3/uL (ref 0.1–1.0)
Monocytes Relative: 13 %
Neutro Abs: 5.2 10*3/uL (ref 1.7–7.7)
Neutrophils Relative %: 74 %
Platelets: 169 10*3/uL (ref 150–400)
RBC: 3.1 MIL/uL — ABNORMAL LOW (ref 4.22–5.81)
RDW: 13.7 % (ref 11.5–15.5)
WBC: 7.2 10*3/uL (ref 4.0–10.5)
nRBC: 0 % (ref 0.0–0.2)

## 2022-11-24 LAB — HEPATIC FUNCTION PANEL
ALT: 18 U/L (ref 0–44)
AST: 19 U/L (ref 15–41)
Albumin: 2.9 g/dL — ABNORMAL LOW (ref 3.5–5.0)
Alkaline Phosphatase: 31 U/L — ABNORMAL LOW (ref 38–126)
Bilirubin, Direct: <0.1 mg/dL (ref 0.0–0.2)
Total Bilirubin: 0.4 mg/dL (ref 0.3–1.2)
Total Protein: 5.4 g/dL — ABNORMAL LOW (ref 6.5–8.1)

## 2022-11-24 LAB — MAGNESIUM: Magnesium: 2 mg/dL (ref 1.7–2.4)

## 2022-11-24 LAB — ETHANOL: Alcohol, Ethyl (B): <10 mg/dL (ref <10)

## 2022-11-24 LAB — PHOSPHORUS: Phosphorus: 1 mg/dL — CL (ref 2.5–4.6)

## 2022-11-24 MED ORDER — LORATADINE 10 MG PO TABS: 10.0000 mg | ORAL_TABLET | Freq: Every day | ORAL | Status: DC | PRN

## 2022-11-24 MED ORDER — LORAZEPAM 1 MG PO TABS
1.0000 mg | ORAL_TABLET | Freq: Once | ORAL | Status: AC
  Administered 2022-11-24: 1 mg via ORAL
  Filled 2022-11-24: qty 1

## 2022-11-24 MED ORDER — POTASSIUM CHLORIDE 10 MEQ/100ML IV SOLN
10.0000 meq | INTRAVENOUS | Status: AC
  Administered 2022-11-24 (×2): 10 meq via INTRAVENOUS
  Filled 2022-11-24 (×2): qty 100

## 2022-11-24 MED ORDER — PREDNISONE 20 MG PO TABS
40.0000 mg | ORAL_TABLET | Freq: Every day | ORAL | Status: DC
  Administered 2022-11-25: 40 mg via ORAL
  Filled 2022-11-24: qty 2

## 2022-11-24 MED ORDER — ACETAMINOPHEN 325 MG PO TABS
650.0000 mg | ORAL_TABLET | Freq: Four times a day (QID) | ORAL | Status: DC | PRN
  Administered 2022-11-24: 650 mg via ORAL
  Filled 2022-11-24: qty 2

## 2022-11-24 MED ORDER — ONDANSETRON HCL 4 MG/2ML IJ SOLN: 4.0000 mg | Freq: Four times a day (QID) | INTRAMUSCULAR | Status: DC | PRN

## 2022-11-24 MED ORDER — MAGNESIUM SULFATE 2 GM/50ML IV SOLN
2.0000 g | Freq: Once | INTRAVENOUS | Status: AC
  Administered 2022-11-24: 2 g via INTRAVENOUS
  Filled 2022-11-24: qty 50

## 2022-11-24 MED ORDER — POTASSIUM PHOSPHATES 15 MMOLE/5ML IV SOLN
30.0000 mmol | Freq: Once | INTRAVENOUS | Status: AC
  Administered 2022-11-24: 30 mmol via INTRAVENOUS
  Filled 2022-11-24: qty 10

## 2022-11-24 MED ORDER — KETOROLAC TROMETHAMINE 30 MG/ML IJ SOLN
30.0000 mg | Freq: Once | INTRAMUSCULAR | Status: AC
  Administered 2022-11-24: 30 mg via INTRAVENOUS
  Filled 2022-11-24: qty 1

## 2022-11-24 MED ORDER — CYCLOBENZAPRINE HCL 10 MG PO TABS: 10.0000 mg | ORAL_TABLET | Freq: Three times a day (TID) | ORAL | Status: DC | PRN

## 2022-11-24 MED ORDER — LEVETIRACETAM 500 MG PO TABS
500.0000 mg | ORAL_TABLET | Freq: Two times a day (BID) | ORAL | Status: DC
  Administered 2022-11-24 – 2022-11-25 (×3): 500 mg via ORAL
  Filled 2022-11-24 (×3): qty 1

## 2022-11-24 MED ORDER — MOMETASONE FURO-FORMOTEROL FUM 200-5 MCG/ACT IN AERO
2.0000 | INHALATION_SPRAY | Freq: Two times a day (BID) | RESPIRATORY_TRACT | Status: DC
  Administered 2022-11-24 – 2022-11-25 (×2): 2 via RESPIRATORY_TRACT
  Filled 2022-11-24 (×2): qty 8.8

## 2022-11-24 MED ORDER — CALCIUM GLUCONATE-NACL 2-0.675 GM/100ML-% IV SOLN
2.0000 g | Freq: Once | INTRAVENOUS | Status: AC
  Administered 2022-11-24: 2000 mg via INTRAVENOUS
  Filled 2022-11-24: qty 100

## 2022-11-24 MED ORDER — ACETAMINOPHEN 650 MG RE SUPP: 650.0000 mg | Freq: Four times a day (QID) | RECTAL | Status: DC | PRN

## 2022-11-24 MED ORDER — LEVETIRACETAM IN NACL 1000 MG/100ML IV SOLN
1000.0000 mg | Freq: Once | INTRAVENOUS | Status: AC
  Administered 2022-11-24: 1000 mg via INTRAVENOUS
  Filled 2022-11-24: qty 100

## 2022-11-24 MED ORDER — POTASSIUM CHLORIDE CRYS ER 20 MEQ PO TBCR
60.0000 meq | EXTENDED_RELEASE_TABLET | Freq: Once | ORAL | Status: AC
  Administered 2022-11-24: 60 meq via ORAL
  Filled 2022-11-24: qty 3

## 2022-11-24 MED ORDER — TRAMADOL HCL 50 MG PO TABS
50.0000 mg | ORAL_TABLET | Freq: Four times a day (QID) | ORAL | Status: DC | PRN
  Administered 2022-11-25: 50 mg via ORAL
  Filled 2022-11-24: qty 1

## 2022-11-24 MED ORDER — ONDANSETRON HCL 4 MG PO TABS: 4.0000 mg | ORAL_TABLET | Freq: Four times a day (QID) | ORAL | Status: DC | PRN

## 2022-11-24 NOTE — ED Notes (Signed)
Patient transported to MRI 

## 2022-11-24 NOTE — ED Provider Notes (Signed)
[facility]-EMERGENCY DEPT Provider Note   CSN: [id number] Arrival date & time: 11/24/22  0531     History  Chief Complaint  Patient presents with   Seizures    Wayne Hawkins is a 52 y.o. male.  HPI     This is a 52 year old male who presents with concern for seizure.  Patient has a history of seizures.  Per EMS report he was found by his roommate in the shower.  The roommate heard him fall and saw him seizing in the shower.  Patient has no recollections of the event.  He does not even remember getting in the shower.  Reports that he takes Keppra.  He states that he is mostly compliant but did miss a dose 3 days ago.  Denies any alcohol or drug use.  Reports headache and right elbow pain.  Home Medications Prior to Admission medications   Medication Sig Start Date End Date Taking? Authorizing Provider  albuterol (PROVENTIL HFA;VENTOLIN HFA) 108 (90 Base) MCG/ACT inhaler Inhale 2 puffs into the lungs every 6 (six) hours as needed for wheezing or shortness of breath.  12/25/18   [provider]  budesonide-formoterol (SYMBICORT) 160-4.5 MCG/ACT inhaler Inhale 2 puffs into the lungs 2 (two) times daily.    [provider]  cyclobenzaprine (FLEXERIL) 10 MG tablet Take 10 mg by mouth 3 (three) times daily as needed for muscle spasms.    [provider]  diazePAM (VALTOCO 10 MG DOSE) 10 MG/0.1ML LIQD Place 20 mg into the nose as needed for active seizure Patient not taking: Reported on 11/19/2022 07/02/22   Atway, Rayann N, DO  diphenhydrAMINE (BENADRYL) 25 MG tablet Take 50 mg by mouth at bedtime as needed for allergies.    [provider]  diphenhydramine-acetaminophen (TYLENOL PM) 25-500 MG TABS tablet Take 2 tablets by mouth at bedtime as needed (sleep).    [provider]  DULoxetine (CYMBALTA) 60 MG capsule Take 1 capsule (60 mg total) by mouth daily. 04/22/22   Dolan Amen, MD  gabapentin (NEURONTIN) 600 MG tablet Take  1 tablet (600 mg total) by mouth 3 (three) times daily. 04/22/22   Dolan Amen, MD  levETIRAcetam (KEPPRA) 500 MG tablet Take 1 tablet (500 mg total) by mouth 2 (two) times daily. 07/02/22   Atway, Rayann N, DO  loratadine (CLARITIN) 10 MG tablet Take 10 mg by mouth daily as needed for allergies or rhinitis.    [provider]  naproxen (NAPROSYN) 375 MG tablet Take 1 tablet twice daily as needed for shoulder pain. 09/29/22   Molpus, Jaun, MD  predniSONE (DELTASONE) 20 MG tablet Take 2 tablets (40 mg total) by mouth daily with breakfast for 3 days. 11/23/22 11/26/22  Sharlene Dory, DO      Allergies    Patient has no known allergies.    Review of Systems   Review of Systems  Musculoskeletal:        Right elbow pain  Neurological:  Positive for headaches.  All other systems reviewed and are negative.   Physical Exam Updated Vital Signs BP 110/73   Pulse 78   Temp (!) 97.4 F (36.3 C) (Oral)   Resp (!) 21   Wt 73 kg   SpO2 91%   BMI 22.45 kg/m  Physical Exam Vitals and nursing note reviewed.  Constitutional:      Appearance: He is well-developed. He is not ill-appearing.     Comments: ABCs intact  HENT:  Head: Normocephalic and atraumatic.  Eyes:     Extraocular Movements: Extraocular movements intact.     Pupils: Pupils are equal, round, and reactive to light.     Comments: Pupils 4 mm and reactive bilaterally  Cardiovascular:     Rate and Rhythm: Normal rate and regular rhythm.     Heart sounds: Normal heart sounds. No murmur heard. Pulmonary:     Effort: Pulmonary effort is normal. No respiratory distress.     Breath sounds: Normal breath sounds. No wheezing.  Abdominal:     General: Bowel sounds are normal.     Palpations: Abdomen is soft.     Tenderness: There is no abdominal tenderness. There is no rebound.  Musculoskeletal:     Cervical back: Neck supple.     Comments: Tenderness palpation right elbow, normal range of motion, no obvious  deformities, 2+ radial pulse  Lymphadenopathy:     Cervical: No cervical adenopathy.  Skin:    General: Skin is warm and dry.  Neurological:     Mental Status: He is alert and oriented to person, place, and time.     Comments: Slow to respond at times but technically oriented, cranial nerves II through XII intact, 5 out of 5 strength in all 4 extremities     ED Results / Procedures / Treatments   Labs (all labs ordered are listed, but only abnormal results are displayed) Labs Reviewed  CBC WITH DIFFERENTIAL/PLATELET - Abnormal; Notable for the following components:      Result Value   RBC 3.10 (*)    Hemoglobin 9.9 (*)    HCT 30.9 (*)    All other components within normal limits  ETHANOL  BASIC METABOLIC PANEL  RAPID URINE DRUG SCREEN, HOSP PERFORMED    EKG None  Radiology CT Cervical Spine Wo Contrast  Result Date: 11/24/2022 CLINICAL DATA:  53 year old male status post seizure and fall in shower this morning. History of seizures. EXAM: CT CERVICAL SPINE WITHOUT CONTRAST TECHNIQUE: Multidetector CT imaging of the cervical spine was performed without intravenous contrast. Multiplanar CT image reconstructions were also generated. RADIATION DOSE REDUCTION: This exam was performed according to the departmental dose-optimization program which includes automated exposure control, adjustment of the mA and/or kV according to patient size and/or use of iterative reconstruction technique. COMPARISON:  CT cervical spine 11/19/2022. FINDINGS: Alignment: Stable normal cervical lordosis. Cervicothoracic junction alignment is within normal limits. Bilateral posterior element alignment is within normal limits. Skull base and vertebrae: Bone mineralization is within normal limits. Visualized skull base is intact. No atlanto-occipital dissociation. C1 and C2 appear intact and aligned. No acute osseous abnormality identified. Soft tissues and spinal canal: No prevertebral fluid or swelling. No visible  canal hematoma. Negative noncontrast visible neck soft tissues. Disc levels: Mild for age cervical spine degeneration appears stable, most pronounced at C6-C7. Mild spinal stenosis is possible there. Upper chest: Partially visible T4 mild superior endplate compression has not been included previously and is age indeterminate. Other visible upper thoracic levels appear intact. Negative lung apices; mild chronic apical scarring. Negative noncontrast visible thoracic inlet. IMPRESSION: 1. No acute traumatic injury identified in the cervical spine. 2. Mild for age cervical spine degeneration, possible mild spinal stenosis at C6-C7. 3. Partially visible T4 mild superior endplate compression fracture has not been previously imaged and is age indeterminate. If there is referable upper back pain then noncontrast Thoracic MRI or Nuclear Medicine Whole-body Bone Scan would best determine acuity. Electronically Signed   By: Lemmie Evens  [place] Crane M.D.   On: 11/24/2022 07:29   CT Head Wo Contrast  Result Date: 11/24/2022 CLINICAL DATA:  52 year old male status post seizure and fall in shower this morning. History of seizures. EXAM: CT HEAD WITHOUT CONTRAST TECHNIQUE: Contiguous axial images were obtained from the base of the skull through the vertex without intravenous contrast. RADIATION DOSE REDUCTION: This exam was performed according to the departmental dose-optimization program which includes automated exposure control, adjustment of the mA and/or kV according to patient size and/or use of iterative reconstruction technique. COMPARISON:  Recent head CT and brain MRI 11/19/2022 FINDINGS: Brain: Cerebral volume is within normal limits for age. No midline shift, ventriculomegaly, mass effect, evidence of mass lesion, intracranial hemorrhage or evidence of cortically based acute infarction. Gray-white matter differentiation is within normal limits throughout the brain. No encephalomalacia identified. Vascular: No suspicious intracranial  vascular hyperdensity. Skull: No acute osseous abnormality identified. Sinuses/Orbits: Ethmoid sinus predominant mucosal thickening and sinus opacification has not significantly changed. Mild if any sinus fluid levels. Some bubbly opacity in the nasal cavity. Tympanic cavities and mastoids remain well aerated. Other: No acute orbit or scalp soft tissue finding. IMPRESSION: 1. Stable and normal for age noncontrast CT appearance of the brain. No acute traumatic injury identified. 2. Bilateral paranasal sinus inflammation stable from last week. Electronically Signed   By: Genevie Ann M.D.   On: 11/24/2022 07:24    Procedures Procedures    Medications Ordered in ED Medications  levETIRAcetam (KEPPRA) IVPB 1000 mg/100 mL premix (0 mg Intravenous Stopped 11/24/22 9629)    ED Course/ Medical Decision Making/ A&P                           Medical Decision Making Amount and/or Complexity of Data Reviewed Labs: ordered. Radiology: ordered.  Risk Prescription drug management.   This patient presents to the ED for concern of seizure, this involves an extensive number of treatment options, and is a complaint that carries with it a high risk of complications and morbidity.  I considered the following differential and admission for this acute, potentially life threatening condition.  The differential diagnosis includes seizure, head injury, other traumatic injury  MDM:    This is a 52 year old male with history of seizures who presents with seizure-like activity.  Reports last seizure was 1 week ago.  Reports he is taking Keppra but did miss a dose.  He does appear slow to answer and is likely somewhat postictal.  He is otherwise neurologically intact.  Patient was loaded with 1 g of Keppra.  Labs obtained.  Labs available reviewed and are largely reassuring including CBC and ethanol level.  BMP is pending.  CT head and neck are negative for traumatic injury.  Right elbow films pending.  (Labs, imaging,  consults)  Labs: I Ordered, and personally interpreted labs.  The pertinent results include: CBC, BMP, ethanol level, UDS  Imaging Studies ordered: I ordered imaging studies including CT head, cervical spine, right elbow I independently visualized and interpreted imaging. I agree with the radiologist interpretation  Additional history obtained from chart review.  External records from outside source obtained and reviewed including prior evaluations  Cardiac Monitoring: The patient was maintained on a cardiac monitor.  I personally viewed and interpreted the cardiac monitored which showed an underlying rhythm of: Sinus rhythm  Reevaluation: After the interventions noted above, I reevaluated the patient and found that they have :improved  Social Determinants of Health:  lives  independently  Disposition: Signed out pending BMP and elbow films.  If reassuring, anticipate discharge.  Patient encouraged to take Keppra as directed.  Co morbidities that complicate the patient evaluation  Past Medical History:  Diagnosis Date   Asthma    COPD (chronic obstructive pulmonary disease) (HCC)    Seizures (HCC)    TBI (traumatic brain injury) (HCC)    age 2     Medicines Meds ordered this encounter  Medications   levETIRAcetam (KEPPRA) IVPB 1000 mg/100 mL premix    I have reviewed the patients home medicines and have made adjustments as needed  Problem List / ED Course: Problem List Items Addressed This Visit   None               Final Clinical Impression(s) / ED Diagnoses Final diagnoses:  None    Rx / DC Orders ED Discharge Orders     None         Jaymee Tilson, Mayer Masker, MD 11/24/22 [phone]

## 2022-11-24 NOTE — ED Triage Notes (Signed)
Pt arrives EMS from home with witnessed seizure in shower. Pt roommate heard pt fall and saw pt seizing, pt has hx of seizures and denies missing mediations. Pt reports hitting head and right elbow. Pt arrives in c-collar placed by EMS

## 2022-11-24 NOTE — ED Provider Notes (Signed)
     ED Course / MDM   Clinical Course as of 11/24/22 0924  Tue Nov 24, 2022  6681 Received sign out pending elbow XR and BMP. Had seizure this morning with known seizure disorder. Missed one dose of keppra.  [WS]  [phone] Patient with significant electrolyte derangements with a calcium of 6.0 and potassium of 2.2.  Will check magnesium.  Will replete potassium and calcium and give empiric magnesium.  Given large derangements, discussed with the hospitalist who will admit the patient for repletion. [WS]  [phone] Patient currently at baseline and agreeable to staying in the hospital. [WS]    Clinical Course User Index [WS] Cristie Hem, MD   Medical Decision Making Problems Addressed: Hypocalcemia: acute illness or injury that poses a threat to life or bodily functions Hypokalemia: acute illness or injury that poses a threat to life or bodily functions Seizure disorder [facility]): chronic illness or injury with exacerbation, progression, or side effects of treatment that poses a threat to life or bodily functions  Amount and/or Complexity of Data Reviewed External Data Reviewed: notes.    Details: D/c summary 2 days ago Labs: ordered. Decision-making details documented in ED Course. Radiology: ordered.  Risk Prescription drug management. Drug therapy requiring intensive monitoring for toxicity. Decision regarding hospitalization.        Cristie Hem, MD 11/24/22 [phone]

## 2022-11-24 NOTE — Plan of Care (Addendum)
Admit date: 11/24/2022                    Expected Discharge Date:    11/25/2022                                                                                                                                                                  Chief Complaint: Seizures ([place]) [R56.9]    Abnormal Labs/ VS:   Pertinent Test Results: K 2.2 - k phos repleted  Diet:  regular  PRN's: Tylenol for headache

## 2022-11-24 NOTE — ED Notes (Signed)
Calcium 6.0, potassium, 2.2.

## 2022-11-24 NOTE — H&P (Signed)
History and Physical    Patient: Wayne Hawkins KYH:[id number] DOB: 1971/06/25 DOA: 11/24/2022 DOS: the patient was seen and examined on 11/24/2022 PCP: Delene Ruffini, MD  Patient coming from: Home  Chief Complaint:  Chief Complaint  Patient presents with   Seizures   HPI: Wayne Hawkins is a 52 y.o. male with medical history significant of asthma, COPD, former tobacco use, traumatic brain injury at age 89, seizure disorder who was recently admitted and discharged from [facility] due to seizures now being brought by EMS to the emergency department after having a seizure in the shower.  His roommate heard him fall and noticed that he was having seizure-like activity in the shower.  He called EMS and the patient was brought to the emergency department.  He has no recollection of the events other than entering the shower.  When I saw him, he was complaining of neck and upper back pain. He denied fever, chills, rhinorrhea, sore throat, wheezing or hemoptysis.  No chest pain, palpitations, diaphoresis, PND, orthopnea or pitting edema of the lower extremities.  No abdominal pain, nausea, emesis, diarrhea, constipation, melena or hematochezia.  No flank pain, dysuria, frequency or hematuria.  No polyuria, polydipsia, polyphagia or blurred vision.   ED course: Initial vital signs were temperature 97.4 F, pulse 87, respiration 18, BP 116/78 mmHg O2 sat 96% on room air.  The patient received Keppra 1000 mg IVPB, KCl 10 mEq IVPB x 2, KCl 60 mEq p.o. x 1, calcium gluconate 2 g IVPB and magnesium sulfate 2 g IVPB.  Lab work: CBC is her white count 7.2, hemoglobin 9.9 g/dL platelets 169.  Alcohol level is normal.  BMP showed a potassium of 2.2 and CO2 of 19 mmol/L with a normal anion gap.  Calcium was 6.0 mg/dL.  Renal function, sodium, chloride and glucose were normal.  Imaging: CT head without contrast stable and normal for age noncontrast CT appearance of the brain.  There was bilateral paranasal  sinus inflammation.  CT cervical spine with no acute traumatic injury.  Mild DDD with C6-C7 level with possible mild spinal stenosis.  Partially visible T4 mild superior endplate compression fracture that has not been previously seen on the imaging and age is indeterminate.  If there is referral upper back pain then noncontrast thoracic MRI or nuclear medicine whole-body bone scan would be best to determine acuity.   Review of Systems: As mentioned in the history of present illness. All other systems reviewed and are negative.  Past Medical History:  Diagnosis Date   Asthma    COPD (chronic obstructive pulmonary disease) (HCC)    Seizures (HCC)    TBI (traumatic brain injury) ([place])    age 54   History reviewed. No pertinent surgical history. Social History:  reports that he quit smoking about 6 years ago. His smoking use included cigarettes. He has a 16.00 pack-year smoking history. He has never used smokeless tobacco. He reports that he does not currently use drugs after having used the following drugs: Marijuana. He reports that he does not drink alcohol.  No Known Allergies  Family History  Problem Relation Age of Onset   Hypertension Mother    Hypertension Father     Prior to Admission medications   Medication Sig Start Date End Date Taking? Authorizing Provider  albuterol (PROVENTIL HFA;VENTOLIN HFA) 108 (90 Base) MCG/ACT inhaler Inhale 2 puffs into the lungs every 6 (six) hours as needed for wheezing or shortness of breath.  12/25/18   [provider]  budesonide-formoterol (SYMBICORT) 160-4.5 MCG/ACT inhaler Inhale 2 puffs into the lungs 2 (two) times daily.    [provider]  cyclobenzaprine (FLEXERIL) 10 MG tablet Take 10 mg by mouth 3 (three) times daily as needed for muscle spasms.    [provider]  diazePAM (VALTOCO 10 MG DOSE) 10 MG/0.1ML LIQD Place 20 mg into the nose as needed for active seizure Patient not taking: Reported on 11/19/2022 07/02/22    Atway, Rayann N, DO  diphenhydrAMINE (BENADRYL) 25 MG tablet Take 50 mg by mouth at bedtime as needed for allergies.    [provider]  diphenhydramine-acetaminophen (TYLENOL PM) 25-500 MG TABS tablet Take 2 tablets by mouth at bedtime as needed (sleep).    [provider]  DULoxetine (CYMBALTA) 60 MG capsule Take 1 capsule (60 mg total) by mouth daily. 04/22/22   Dolan Amen, MD  gabapentin (NEURONTIN) 600 MG tablet Take 1 tablet (600 mg total) by mouth 3 (three) times daily. 04/22/22   Dolan Amen, MD  levETIRAcetam (KEPPRA) 500 MG tablet Take 1 tablet (500 mg total) by mouth 2 (two) times daily. 07/02/22   Atway, Rayann N, DO  loratadine (CLARITIN) 10 MG tablet Take 10 mg by mouth daily as needed for allergies or rhinitis.    [provider]  naproxen (NAPROSYN) 375 MG tablet Take 1 tablet twice daily as needed for shoulder pain. 09/29/22   Molpus, Kennard, MD  predniSONE (DELTASONE) 20 MG tablet Take 2 tablets (40 mg total) by mouth daily with breakfast for 3 days. 11/23/22 11/27/22  Sharlene Dory, DO    Physical Exam: Vitals:   11/24/22 0730 11/24/22 0745 11/24/22 0815 11/24/22 0830  BP: 117/65  107/75 102/62  Pulse: 79 80 85 80  Resp: (!) 21 18 14 16   Temp:      TempSrc:      SpO2: 91% 94% 96% 100%  Weight:       Physical Exam Vitals and nursing note reviewed.  Constitutional:      General: He is awake. He is not in acute distress.    Appearance: Normal appearance.  HENT:     Head: Normocephalic.     Nose: No rhinorrhea.     Mouth/Throat:     Mouth: Mucous membranes are moist.  Eyes:     General: No scleral icterus.    Pupils: Pupils are equal, round, and reactive to light.  Neck:     Vascular: No JVD.  Cardiovascular:     Rate and Rhythm: Normal rate and regular rhythm.     Heart sounds: S1 normal and S2 normal.  Pulmonary:     Effort: Pulmonary effort is normal.     Breath sounds: Normal breath sounds.  Abdominal:     General:  Bowel sounds are normal. There is no distension.     Palpations: Abdomen is soft.     Tenderness: There is no abdominal tenderness. There is no guarding.  Musculoskeletal:     Cervical back: Neck supple. Spasms present. Decreased range of motion.     Thoracic back: Tenderness present.     Right lower leg: No edema.     Left lower leg: No edema.  Skin:    General: Skin is warm and dry.  Neurological:     General: No focal deficit present.     Mental Status: He is alert and oriented to person, place, and time.     Cranial Nerves: No cranial nerve deficit.  Sensory: No sensory deficit.  Psychiatric:        Mood and Affect: Mood is anxious.        Behavior: Behavior is cooperative.   Data Reviewed:  Results are pending, will review when available.  Assessment and Plan: Principal Problem:   Seizures ([place]) Briefly discussed with neuro. Observation/telemetry. Seizure precautions. Received a dose of 1000 mg Keppra IVPB. Continue oral formulation 500 mg p.o. twice daily. Lorazepam as needed for seizures. If further seizures reconsult neurology.  Active Problems:   Chronic obstructive pulmonary disease (HCC) Continue Symbicort or formulary equivalent) Short acting bronchodilators as needed.    Hypokalemia Replacing. Follow potassium level.    Hypocalcemia Replacing. Follow calcium level in the morning. May need further workup.    Hypophosphatemia Replacing. Follow-up level in AM.    Compression fracture of T4 vertebra (HCC) Obtaining MRI. Check vitamin D level. Continue muscle relaxants. Analgesics as needed.     Advance Care Planning:   Code Status: Full Code   Consults:   Family Communication:   Severity of Illness: The appropriate patient status for this patient is OBSERVATION. Observation status is judged to be reasonable and necessary in order to provide the required intensity of service to ensure the patient's safety. The patient's presenting  symptoms, physical exam findings, and initial radiographic and laboratory data in the context of their medical condition is felt to place them at decreased risk for further clinical deterioration. Furthermore, it is anticipated that the patient will be medically stable for discharge from the hospital within 2 midnights of admission.   Author: Reubin Milan, MD 11/24/2022 9:04 AM  For on call review [email or web address].   This document was prepared using Dragon voice recognition software and may contain some unintended transcription errors.

## 2022-11-25 DIAGNOSIS — R569 Unspecified convulsions: Secondary | ICD-10-CM | POA: Diagnosis not present

## 2022-11-25 LAB — COMPREHENSIVE METABOLIC PANEL
ALT: 22 U/L (ref 0–44)
AST: 21 U/L (ref 15–41)
Albumin: 4 g/dL (ref 3.5–5.0)
Alkaline Phosphatase: 48 U/L (ref 38–126)
Anion gap: 10 (ref 5–15)
BUN: 16 mg/dL (ref 6–20)
CO2: 25 mmol/L (ref 22–32)
Calcium: 8.8 mg/dL — ABNORMAL LOW (ref 8.9–10.3)
Chloride: 100 mmol/L (ref 98–111)
Creatinine, Ser: 0.92 mg/dL (ref 0.61–1.24)
GFR, Estimated: >60 mL/min (ref >60)
Glucose, Bld: 104 mg/dL — ABNORMAL HIGH (ref 70–99)
Potassium: 3.4 mmol/L — ABNORMAL LOW (ref 3.5–5.1)
Sodium: 135 mmol/L (ref 135–145)
Total Bilirubin: 0.8 mg/dL (ref 0.3–1.2)
Total Protein: 7.4 g/dL (ref 6.5–8.1)

## 2022-11-25 LAB — VITAMIN D 25 HYDROXY (VIT D DEFICIENCY, FRACTURES): Vit D, 25-Hydroxy: 26.86 ng/mL — ABNORMAL LOW (ref 30–100)

## 2022-11-25 LAB — PHOSPHORUS: Phosphorus: 3.2 mg/dL (ref 2.5–4.6)

## 2022-11-25 LAB — CBC
HCT: 45.4 % (ref 39.0–52.0)
Hemoglobin: 15.2 g/dL (ref 13.0–17.0)
MCH: 31.3 pg (ref 26.0–34.0)
MCHC: 33.5 g/dL (ref 30.0–36.0)
MCV: 93.4 fL (ref 80.0–100.0)
Platelets: 259 10*3/uL (ref 150–400)
RBC: 4.86 MIL/uL (ref 4.22–5.81)
RDW: 13.3 % (ref 11.5–15.5)
WBC: 5.9 10*3/uL (ref 4.0–10.5)
nRBC: 0 % (ref 0.0–0.2)

## 2022-11-25 MED ORDER — POTASSIUM CHLORIDE CRYS ER 20 MEQ PO TBCR
40.0000 meq | EXTENDED_RELEASE_TABLET | Freq: Once | ORAL | Status: AC
  Administered 2022-11-25: 40 meq via ORAL
  Filled 2022-11-25: qty 2

## 2022-11-25 NOTE — Plan of Care (Signed)

## 2022-11-25 NOTE — Discharge Summary (Signed)
Physician Discharge Summary   Patient: Wayne Hawkins MRN: [id number] DOB: 04/14/1971  Admit date:     11/24/2022  Discharge date: 11/25/22  Discharge Physician: Briant CedarNkeiruka J Stoney Karczewski   PCP: Adron BeneGawaluck, [place], MD   Recommendations at discharge:  Signed AMA   Discharge Diagnoses: Principal Problem:   Seizures (HCC) Active Problems:   Chronic obstructive pulmonary disease (HCC)   Hypokalemia   Hypocalcemia   Hypophosphatemia   Compression fracture of T4 vertebra [facility](HCC)    Hospital Course: Wayne HarriesJohn Germany is a 52 y.o. male with medical history significant of asthma, COPD, former tobacco use, traumatic brain injury at age 52, seizure disorder who [facility] seizures now being brought by EMS to the emergency department after having a seizure in the shower.  His roommate heard him fall and noticed that he was having seizure-like activity in the shower. In the ED, noted electrolytes abnormalities. CT head without contrast stable. CT cervical spine with no acute traumatic injury.  Mild DDD with C6-C7 level with possible mild spinal stenosis.  Patient admitted for further management. Signed AMA  Assessment and Plan:  Hx of seizures Noted breakthrough seizure Continued keppra Seizure precautions  Chronic obstructive pulmonary disease (HCC) Continue Symbicort or formulary equivalent) Short acting bronchodilators as needed.   Hypokalemia Replaced prn   Hypocalcemia Replaced prn  Hypophosphatemia Replaced prn   Compression fracture of T4 vertebra (HCC) MRI with no acute fracture    Pain control - [place] Controlled Substance Reporting System database was reviewed. and patient was instructed, not to drive, operate heavy machinery, perform activities at heights, swimming or participation in water activities or provide baby-sitting services while on Pain, Sleep and Anxiety Medications; until their outpatient Physician has  advised to do so again. Also recommended to not to take more than prescribed Pain, Sleep and Anxiety Medications.    Consultants: None Procedures performed: None Disposition: Home Diet recommendation:  Regular diet   DISCHARGE MEDICATION: Signed AMA  Discharge Exam: Filed Weights   11/24/22 0542 11/25/22 0209  Weight: 73 kg 73 kg   Signed AMA  Condition at discharge:  signed AMA    The results of significant diagnostics from this hospitalization (including imaging, microbiology, ancillary and laboratory) are listed below for reference.   Imaging Studies: MR THORACIC SPINE WO CONTRAST  Result Date: 11/24/2022 CLINICAL DATA:  Partially visible T4 mild superior endplate compression fracture, age indeterminate, seen on CT cervical spine today. Patient with history of seizures and had a seizure this morning. Complains of neck pain today. EXAM: MRI THORACIC SPINE WITHOUT CONTRAST TECHNIQUE: Multiplanar, multisequence MR imaging of the thoracic spine was performed. No intravenous contrast was administered. COMPARISON:  CT cervical spine 11/24/2022 FINDINGS: Alignment:  Normal sagittal alignment. Vertebrae: There is minimal anterior inferior T4 endplate concavity/depression as seen on today's CT cervical spine. No associated marrow edema, and this is chronic. There are additional mild degenerative Schmorl's nodes seen at the T6-7 through T10-11 levels. No acute fracture. Cord:  Normal size and signal of the thoracic cord. Paraspinal and other soft tissues: No significant abnormality is seen within the paraspinal soft tissues. Disc levels: T5-6: Minimal right paramidline posterior disc protrusion with mild cephalad extension. Minimal contact on the ventral cord. No central canal or neuroforaminal stenosis. T6-7: Mild-to-moderate disc space narrowing. Minimal posterior disc osteophyte complex and superimposed minimal right paracentral posterior disc protrusion. No central canal or neuroforaminal  stenosis. T7-8: Mild disc space narrowing. Mild bilateral facet  joint hypertrophy. No significant central canal or neuroforaminal stenosis. T8-9: Midline to right paracentral posterior disc osteophyte complex mildly impresses on the right ventral cord. Adequate CSF still seen posterior to the cord. Severe right lateral recess stenosis. No central canal stenosis. Moderate bilateral facet joint hypertrophy and mild-to-moderate bilateral neuroforaminal stenosis. T9-10: Moderate bilateral facet joint hypertrophy. Right paracentral posterior disc protrusion. Mild right lateral recess stenosis. No central canal stenosis. T10-11: Moderate bilateral facet joint hypertrophy with mild-to-moderate right and mild left neuroforaminal stenosis. IMPRESSION: 1. No acute fracture. Minimal anterior inferior T4 endplate concavity/depression as seen on today's CT cervical spine is chronic. 2. Multilevel degenerative disc and joint changes as above. 3. T8-9 midline to right paracentral posterior disc osteophyte complex mildly impresses on the right ventral cord. Severe right lateral recess stenosis. No central canal stenosis. Mild-to-moderate bilateral neuroforaminal stenosis. 4. T9-10 mild right lateral recess stenosis. 5. T10-11 mild-to-moderate right and mild left neuroforaminal stenosis. Electronically Signed   By: Yvonne Kendall M.D.   On: 11/24/2022 19:19   DG Elbow Complete Right  Result Date: 11/24/2022 CLINICAL DATA:  Fall.  Right elbow injury. EXAM: RIGHT ELBOW - COMPLETE 3+ VIEW COMPARISON:  None Available. FINDINGS: There is no evidence of fracture, dislocation, or joint effusion. There is no evidence of arthropathy or other focal bone abnormality. Soft tissues are unremarkable. IMPRESSION: Negative. Electronically Signed   By: Misty Stanley M.D.   On: 11/24/2022 07:56   CT Cervical Spine Wo Contrast  Result Date: 11/24/2022 CLINICAL DATA:  52 year old male status post seizure and fall in shower this morning. History of  seizures. EXAM: CT CERVICAL SPINE WITHOUT CONTRAST TECHNIQUE: Multidetector CT imaging of the cervical spine was performed without intravenous contrast. Multiplanar CT image reconstructions were also generated. RADIATION DOSE REDUCTION: This exam was performed according to the departmental dose-optimization program which includes automated exposure control, adjustment of the mA and/or kV according to patient size and/or use of iterative reconstruction technique. COMPARISON:  CT cervical spine 11/19/2022. FINDINGS: Alignment: Stable normal cervical lordosis. Cervicothoracic junction alignment is within normal limits. Bilateral posterior element alignment is within normal limits. Skull base and vertebrae: Bone mineralization is within normal limits. Visualized skull base is intact. No atlanto-occipital dissociation. C1 and C2 appear intact and aligned. No acute osseous abnormality identified. Soft tissues and spinal canal: No prevertebral fluid or swelling. No visible canal hematoma. Negative noncontrast visible neck soft tissues. Disc levels: Mild for age cervical spine degeneration appears stable, most pronounced at C6-C7. Mild spinal stenosis is possible there. Upper chest: Partially visible T4 mild superior endplate compression has not been included previously and is age indeterminate. Other visible upper thoracic levels appear intact. Negative lung apices; mild chronic apical scarring. Negative noncontrast visible thoracic inlet. IMPRESSION: 1. No acute traumatic injury identified in the cervical spine. 2. Mild for age cervical spine degeneration, possible mild spinal stenosis at C6-C7. 3. Partially visible T4 mild superior endplate compression fracture has not been previously imaged and is age indeterminate. If there is referable upper back pain then noncontrast Thoracic MRI or Nuclear Medicine Whole-body Bone Scan would best determine acuity. Electronically Signed   By: Genevie Ann M.D.   On: 11/24/2022 07:29    CT Head Wo Contrast  Result Date: 11/24/2022 CLINICAL DATA:  52 year old male status post seizure and fall in shower this morning. History of seizures. EXAM: CT HEAD WITHOUT CONTRAST TECHNIQUE: Contiguous axial images were obtained from the base of the skull through the vertex without intravenous contrast. RADIATION DOSE REDUCTION:  This exam was performed according to the departmental dose-optimization program which includes automated exposure control, adjustment of the mA and/or kV according to patient size and/or use of iterative reconstruction technique. COMPARISON:  Recent head CT and brain MRI 11/19/2022 FINDINGS: Brain: Cerebral volume is within normal limits for age. No midline shift, ventriculomegaly, mass effect, evidence of mass lesion, intracranial hemorrhage or evidence of cortically based acute infarction. Gray-white matter differentiation is within normal limits throughout the brain. No encephalomalacia identified. Vascular: No suspicious intracranial vascular hyperdensity. Skull: No acute osseous abnormality identified. Sinuses/Orbits: Ethmoid sinus predominant mucosal thickening and sinus opacification has not significantly changed. Mild if any sinus fluid levels. Some bubbly opacity in the nasal cavity. Tympanic cavities and mastoids remain well aerated. Other: No acute orbit or scalp soft tissue finding. IMPRESSION: 1. Stable and normal for age noncontrast CT appearance of the brain. No acute traumatic injury identified. 2. Bilateral paranasal sinus inflammation stable from last week. Electronically Signed   By: Odessa Fleming M.D.   On: 11/24/2022 07:24   MR BRAIN WO CONTRAST  Result Date: 11/20/2022 CLINICAL DATA:  New onset seizure EXAM: MRI HEAD WITHOUT CONTRAST TECHNIQUE: Multiplanar, multiecho pulse sequences of the brain and surrounding structures were obtained without intravenous contrast. COMPARISON:  07/29/2014 FINDINGS: Brain: No acute infarction, hemorrhage, hydrocephalus,  extra-axial collection or mass lesion. No chronic microhemorrhage. Minimal multifocal white matter hyperintense T2-weighted signal. Normal CSF spaces. Midline structures are normal. Vascular: Normal flow voids. Skull and upper cervical spine: Normal marrow signal. Sinuses/Orbits: Moderate paranasal sinus mucosal thickening, greatest in the ethmoid air cells. Normal orbits. Other: None IMPRESSION: 1. No acute intracranial abnormality. Electronically Signed   By: Deatra Robinson M.D.   On: 11/20/2022 00:22   MR LUMBAR SPINE WO CONTRAST  Result Date: 11/20/2022 CLINICAL DATA:  Low back pain EXAM: MRI LUMBAR SPINE WITHOUT CONTRAST TECHNIQUE: Multiplanar, multisequence MR imaging of the lumbar spine was performed. No intravenous contrast was administered. COMPARISON:  03/26/2022 FINDINGS: Segmentation:  Standard. Alignment:  Physiologic. Vertebrae:  No fracture, evidence of discitis, or bone lesion. Conus medullaris and cauda equina: Conus extends to the L1 level. Conus and cauda equina appear normal. Paraspinal and other soft tissues: Negative. Disc levels: T12-L1: Normal. L1-L2: Normal disc space and facet joints. No spinal canal stenosis. No neural foraminal stenosis. L2-L3: Small right subarticular disc protrusion, decreased in size. Minimal narrowing of the right lateral recess. No central spinal canal stenosis. No neural foraminal stenosis. L3-L4: Unchanged small central disc protrusion. No spinal canal stenosis. No neural foraminal stenosis. L4-L5: Partial involution previously demonstrated right subarticular extrusion. Persistent small bulge. No spinal canal stenosis. No neural foraminal stenosis. L5-S1: Small disc bulge, unchanged. No spinal canal stenosis. No neural foraminal stenosis. Visualized sacrum: Normal. IMPRESSION: 1. No cauda equina compression. 2. Partial involution of previously demonstrated right subarticular disc extrusion at L4-L5. 3. Small right subarticular disc protrusion at L2-L3,  decreased in size. 4. Unchanged small central disc protrusion at L3-L4 and small disc bulge at L5-S1. No spinal canal or neural foraminal stenosis. Electronically Signed   By: Deatra Robinson M.D.   On: 11/20/2022 00:15   CT Lumbar Spine Wo Contrast  Result Date: 11/19/2022 CLINICAL DATA:  Seizure, fell EXAM: CT Lumbar Spine without contrast TECHNIQUE: Technique: Multiplanar CT images of the lumbar spine were reconstructed from contemporary CT of the Abdomen and Pelvis. RADIATION DOSE REDUCTION: This exam was performed according to the departmental dose-optimization program which includes automated exposure control, adjustment of the mA and/or kV  according to patient size and/or use of iterative reconstruction technique. CONTRAST:  No additional COMPARISON:  03/26/2022 FINDINGS: Segmentation: 5 lumbar type vertebrae. Alignment: Normal. Vertebrae: No acute fracture or focal pathologic process. Paraspinal and other soft tissues: Negative. Disc levels: At L3-4 there is circumferential disc bulge and bilateral facet hypertrophy without significant compressive sequela. At L4-5 there is circumferential disc bulge with bilateral facet and ligamentum flavum hypertrophy, with mild-to-moderate central canal stenosis. At L5-S1 there is circumferential disc bulge slightly asymmetric to the left. Bilateral facet hypertrophy. Left lateral recess narrowing with mild symmetrical bilateral neural foraminal encroachment. Reconstructed images demonstrate no additional findings. IMPRESSION: 1. No acute lumbar spine fracture. 2. Stable lower lumbar spondylosis, without significant change since prior MRI. Electronically Signed   By: Sharlet Salina M.D.   On: 11/19/2022 19:12   CT ABDOMEN PELVIS W CONTRAST  Result Date: 11/19/2022 CLINICAL DATA:  Seizure, fell, abdominal trauma EXAM: CT ABDOMEN AND PELVIS WITH CONTRAST TECHNIQUE: Multidetector CT imaging of the abdomen and pelvis was performed using the standard protocol following  bolus administration of intravenous contrast. RADIATION DOSE REDUCTION: This exam was performed according to the departmental dose-optimization program which includes automated exposure control, adjustment of the mA and/or kV according to patient size and/or use of iterative reconstruction technique. CONTRAST:  OMNIPAQUE IOHEXOL 300 MG/ML  SOLN COMPARISON:  None Available. FINDINGS: Lower chest: No acute pleural or parenchymal lung disease. Two left lower lobe pulmonary nodules are identified. Solid nodule image 7/4 measures 4 mm, and a solid nodule image 19/4 measures 3 mm. Hepatobiliary: No focal liver abnormality is seen. No gallstones, gallbladder wall thickening, or biliary dilatation. Pancreas: Unremarkable. No pancreatic ductal dilatation or surrounding inflammatory changes. Spleen: Normal in size without focal abnormality. Adrenals/Urinary Tract: Adrenal glands are unremarkable. Kidneys are normal, without renal calculi, focal lesion, or hydronephrosis. Bladder is significantly distended, without acute abnormality. Stomach/Bowel: No bowel obstruction or ileus. Normal appendix right lower quadrant. No bowel wall thickening or inflammatory change. Vascular/Lymphatic: No significant vascular findings are present. No enlarged abdominal or pelvic lymph nodes. Reproductive: Prostate is unremarkable. Other: No free fluid or free intraperitoneal gas. No abdominal wall hernia. Musculoskeletal: No acute or destructive bony lesions. Reconstructed images demonstrate no additional findings. IMPRESSION: 1. No acute intra-abdominal or intrapelvic process. 2. Multiple pulmonary nodules. Most significant: 4 mm solid pulmonary nodule. Per Fleischner Society Guidelines, no routine follow-up imaging is recommended. These guidelines do not apply to immunocompromised patients and patients with cancer. Follow up in patients with significant comorbidities as clinically warranted. For lung cancer screening, adhere to Lung-RADS  guidelines. Reference: Radiology. 2017; 284(1):228-43. Electronically Signed   By: Sharlet Salina M.D.   On: 11/19/2022 19:09   CT Head Wo Contrast  Result Date: 11/19/2022 CLINICAL DATA:  Head trauma, moderate-severe; Neck trauma, dangerous injury mechanism (Age 37-64y) EXAM: CT HEAD WITHOUT CONTRAST CT CERVICAL SPINE WITHOUT CONTRAST TECHNIQUE: Multidetector CT imaging of the head and cervical spine was performed following the standard protocol without intravenous contrast. Multiplanar CT image reconstructions of the cervical spine were also generated. RADIATION DOSE REDUCTION: This exam was performed according to the departmental dose-optimization program which includes automated exposure control, adjustment of the mA and/or kV according to patient size and/or use of iterative reconstruction technique. COMPARISON:  09/29/2022 FINDINGS: CT HEAD FINDINGS Brain: No evidence of acute infarction, hemorrhage, hydrocephalus, extra-axial collection or mass lesion/mass effect. Vascular: No hyperdense vessel or unexpected calcification. Skull: Normal. Negative for fracture or focal lesion. Sinuses/Orbits: Diffuse mucosal thickening of the  paranasal sinuses bilaterally, most pronounced within the ethmoid air cells. Other: Negative for scalp hematoma. CT CERVICAL SPINE FINDINGS Alignment: Facet joints are aligned without dislocation or traumatic listhesis. Dens and lateral masses are aligned. Skull base and vertebrae: No acute fracture. No primary bone lesion or focal pathologic process. Soft tissues and spinal canal: No prevertebral fluid or swelling. No visible canal hematoma. Disc levels: Intervertebral disc heights are relatively well preserved with mild facet and uncovertebral arthropathy. Upper chest: Biapical pleuroparenchymal scarring and mild paraseptal emphysema. Other: None. IMPRESSION: 1. No acute intracranial abnormality. 2. No acute fracture or subluxation of the cervical spine. 3. Diffuse paranasal sinus  disease. Electronically Signed   By: Duanne Guess D.O.   On: 11/19/2022 16:50   CT Cervical Spine Wo Contrast  Result Date: 11/19/2022 CLINICAL DATA:  Head trauma, moderate-severe; Neck trauma, dangerous injury mechanism (Age 76-64y) EXAM: CT HEAD WITHOUT CONTRAST CT CERVICAL SPINE WITHOUT CONTRAST TECHNIQUE: Multidetector CT imaging of the head and cervical spine was performed following the standard protocol without intravenous contrast. Multiplanar CT image reconstructions of the cervical spine were also generated. RADIATION DOSE REDUCTION: This exam was performed according to the departmental dose-optimization program which includes automated exposure control, adjustment of the mA and/or kV according to patient size and/or use of iterative reconstruction technique. COMPARISON:  09/29/2022 FINDINGS: CT HEAD FINDINGS Brain: No evidence of acute infarction, hemorrhage, hydrocephalus, extra-axial collection or mass lesion/mass effect. Vascular: No hyperdense vessel or unexpected calcification. Skull: Normal. Negative for fracture or focal lesion. Sinuses/Orbits: Diffuse mucosal thickening of the paranasal sinuses bilaterally, most pronounced within the ethmoid air cells. Other: Negative for scalp hematoma. CT CERVICAL SPINE FINDINGS Alignment: Facet joints are aligned without dislocation or traumatic listhesis. Dens and lateral masses are aligned. Skull base and vertebrae: No acute fracture. No primary bone lesion or focal pathologic process. Soft tissues and spinal canal: No prevertebral fluid or swelling. No visible canal hematoma. Disc levels: Intervertebral disc heights are relatively well preserved with mild facet and uncovertebral arthropathy. Upper chest: Biapical pleuroparenchymal scarring and mild paraseptal emphysema. Other: None. IMPRESSION: 1. No acute intracranial abnormality. 2. No acute fracture or subluxation of the cervical spine. 3. Diffuse paranasal sinus disease. Electronically Signed    By: Duanne Guess D.O.   On: 11/19/2022 16:50   DG Foot Complete Left  Result Date: 11/19/2022 CLINICAL DATA:  Trauma, fall EXAM: LEFT FOOT - COMPLETE 3+ VIEW COMPARISON:  None Available. FINDINGS: No recent fracture or dislocation is seen. There is 2 mm smooth marginated calcification adjacent to the medial margin of the medial cuneiform seen in the oblique projection. IMPRESSION: No recent fracture or dislocation is seen. 2 mm smooth marginated calcification adjacent to medial cuneiform may suggest old avulsion or ligament calcification from previous injury. Electronically Signed   By: Ernie Avena M.D.   On: 11/19/2022 16:31   DG Hip Unilat W or Wo Pelvis 2-3 Views Right  Result Date: 11/19/2022 CLINICAL DATA:  Trauma, fall EXAM: DG HIP (WITH OR WITHOUT PELVIS) 2-3V RIGHT COMPARISON:  None Available. FINDINGS: No recent fracture or dislocation is seen. Phleboliths are seen in pelvis. SI joints are symmetrical. IMPRESSION: No fracture or dislocation is seen. Electronically Signed   By: Ernie Avena M.D.   On: 11/19/2022 16:29   DG Shoulder Right  Result Date: 11/19/2022 CLINICAL DATA:  Trauma, fall EXAM: RIGHT SHOULDER - 2+ VIEW COMPARISON:  05/12/2022 FINDINGS: No fracture or dislocation is seen. There are no abnormal soft tissue calcifications. IMPRESSION: No radiographic  abnormalities are seen in right shoulder. Electronically Signed   By: Elmer Picker M.D.   On: 11/19/2022 16:27    Microbiology: Results for orders placed or performed during the hospital encounter of 07/18/20  Urine culture     Status: Abnormal   Collection Time: 07/18/20 11:23 AM   Specimen: Urine, Clean Catch  Result Value Ref Range Status   Specimen Description   Final    URINE, CLEAN CATCH Performed at [facility], [place] [address], [place], [place] [address]    Special Requests   Final    NONE Performed at [facility], [place] [address]  [place], [place] [address]    Culture (A)  Final    <10,000 COLONIES/mL INSIGNIFICANT GROWTH Performed at [facility] [address], [place], [place] [address]    Report Status 07/19/2020 FINAL  Final    Labs: CBC: Recent Labs  Lab 11/19/22 1457 11/21/22 0545 11/22/22 0600 11/24/22 0610 11/25/22 0638  WBC 7.6 12.2* 7.8 7.2 5.9  NEUTROABS 3.3  --   --  5.2  --   HGB 14.9 15.4 15.5 9.9* 15.2  HCT 44.9 45.7 47.4 30.9* 45.4  MCV 94.5 93.6 94.4 99.7 93.4  PLT 316 310 306 169 413   Basic Metabolic Panel: Recent Labs  Lab 11/19/22 1457 11/21/22 0545 11/22/22 0600 11/24/22 0610 11/25/22 0638  NA 138 136 138 136 135  K 4.2 4.1 4.4 2.2* 3.4*  CL 107 105 102 108 100  CO2 24 21* 27 19* 25  GLUCOSE 95 105* 147* 88 104*  BUN 7 12 14 11 16   CREATININE 0.96 0.94 0.93 0.77 0.92  CALCIUM 8.9 9.2 9.6 6.0* 8.8*  MG  --  2.0 2.1 2.0  --   PHOS  --   --   --  1.0* 3.2   Liver Function Tests: Recent Labs  Lab 11/19/22 1457 11/24/22 0610 11/25/22 0638  AST 15 19 21   ALT 13 18 22   ALKPHOS 49 31* 48  BILITOT 0.6 0.4 0.8  PROT 7.2 5.4* 7.4  ALBUMIN 4.0 2.9* 4.0   CBG: No results for input(s): "GLUCAP" in the last 168 hours.  Discharge time spent: less than 30 minutes.  Signed: Alma Friendly, MD [facility] 11/25/2022

## 2022-11-25 NOTE — Progress Notes (Signed)
Patient was transferred from ED at [facility]. Alert and oriented x 4. Pain is complained at lower back at 9 from 0-10 scale. Room is set up, vital signs was taken and call lights was within patient's reach.

## 2022-11-25 NOTE — Progress Notes (Signed)
  Transition of Care [facility]) Screening Note   Patient Details  Name: Wayne Hawkins Date of Birth: 1971/10/15   Transition of Care [facility]) CM/SW Contact:    Vassie Moselle, LCSW Phone Number: 11/25/2022, 9:10 AM    Transition of Care Department [facility]) has reviewed patient and no TOC needs have been identified at this time. We will continue to monitor patient advancement through interdisciplinary progression rounds. If new patient transition needs arise, please place a TOC consult.

## 2022-11-25 NOTE — Progress Notes (Signed)
Pt removes own iv x2. Refuses to replace tele after shower, states going home now. Ride is here. Pt signs ama paperwork. Md aware. Pt shows no sign of distress. Denies needs. Caregiver at bedside accompanies pt to main entrance to return home via pov.

## 2022-12-11 ENCOUNTER — Other Ambulatory Visit (HOSPITAL_COMMUNITY): Payer: Self-pay

## 2023-01-15 ENCOUNTER — Other Ambulatory Visit (HOSPITAL_BASED_OUTPATIENT_CLINIC_OR_DEPARTMENT_OTHER): Payer: Self-pay

## 2023-01-22 ENCOUNTER — Other Ambulatory Visit (HOSPITAL_COMMUNITY): Payer: Self-pay

## 2023-02-08 ENCOUNTER — Other Ambulatory Visit (HOSPITAL_COMMUNITY): Payer: Self-pay

## 2023-02-09 ENCOUNTER — Other Ambulatory Visit: Payer: Self-pay

## 2023-02-11 ENCOUNTER — Other Ambulatory Visit: Payer: Self-pay

## 2023-02-14 ENCOUNTER — Other Ambulatory Visit: Payer: Self-pay

## 2023-02-14 MED ORDER — GABAPENTIN 600 MG PO TABS
600.0000 mg | ORAL_TABLET | Freq: Three times a day (TID) | ORAL | 3 refills | Status: AC
  Filled 2023-02-14: qty 180, 60d supply, fill #0

## 2023-02-14 MED ORDER — DULOXETINE HCL 60 MG PO CPEP
60.0000 mg | ORAL_CAPSULE | Freq: Every day | ORAL | 3 refills | Status: DC
  Filled 2023-02-14: qty 90, 90d supply, fill #0

## 2023-02-15 ENCOUNTER — Other Ambulatory Visit (HOSPITAL_COMMUNITY): Payer: Self-pay

## 2023-02-26 ENCOUNTER — Other Ambulatory Visit (HOSPITAL_COMMUNITY): Payer: Self-pay

## 2023-03-29 ENCOUNTER — Other Ambulatory Visit (HOSPITAL_COMMUNITY): Payer: Self-pay

## 2023-04-06 ENCOUNTER — Ambulatory Visit: Payer: Medicaid Other | Admitting: Neurology

## 2023-04-06 ENCOUNTER — Encounter: Payer: Self-pay | Admitting: Neurology

## 2023-04-06 VITALS — BP 112/86 | HR 98 | Ht 72.0 in | Wt 163.0 lb

## 2023-04-06 DIAGNOSIS — G40909 Epilepsy, unspecified, not intractable, without status epilepticus: Secondary | ICD-10-CM

## 2023-04-06 DIAGNOSIS — Z5181 Encounter for therapeutic drug level monitoring: Secondary | ICD-10-CM

## 2023-04-06 MED ORDER — DIVALPROEX SODIUM 250 MG PO DR TAB: 750.0000 mg | DELAYED_RELEASE_TABLET | Freq: Two times a day (BID) | ORAL | 3 refills | Status: DC

## 2023-04-06 MED ORDER — VALTOCO 15 MG DOSE 7.5 MG/0.1ML NA LQPK: 15.0000 mg | NASAL | 1 refills | Status: DC | PRN

## 2023-04-06 NOTE — Progress Notes (Signed)
[facility]  PATIENT: Wayne Hawkins DOB: February 28, 1971  REQUESTING CLINICIAN: Jim Like, NP HISTORY FROM: Patient and sister  REASON FOR VISIT: Establish care for seizures    HISTORICAL  CHIEF COMPLAINT:  Chief Complaint  Patient presents with   New Patient (Initial Visit)    Rm 12. Patient with sister, last reports seizures in December. Patient reports multiple thirty minute seizure but unsure of last known. 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  Patient reports sustaining a traumatic brain injury and skull fracture at the age of 52.  He did fine until about 12 years ago when he first started having seizures.  Initially was found by roommate having a generalized convulsion.  Since then he has been having seizures.  His last seizure was in December 2024. Sometimes he can have seizure lasting up to 30 minutes.  Currently he is on Keppra 500 mg twice daily, has not had any seizures since December but reports side effect of dry mouth, irritability and anger.  He has not tried any other medications.  He denies any family history of seizures.  Other than the traumatic brain injury, no other seizure risk factors determined.   Handedness: Right handed   Onset: 12 years ago   Seizure Type: Generalzied convulsion   Current frequency: Last seizure was December 26  Any injuries from seizures: Concussion  Seizure risk factors: Traumatic brain injury at the age of 52, skull fracture   Previous ASMs: levetiracetam  Currenty ASMs: Levetiracetam 500 mg BID  ASMs side effects: Dry mouth, Irritable, very aggressive  Brain Images: No acute abnormality   Previous EEGs: Normal routine 2015   OTHER MEDICAL CONDITIONS: Seizure disorder, History of traumatic brain  injury, COPD   REVIEW OF SYSTEMS: Full 14 system review of systems performed and negative with exception of: Asa noted in the HPI   ALLERGIES: No Known Allergies  HOME MEDICATIONS: Outpatient Medications Prior to Visit  Medication Sig Dispense Refill   albuterol (PROVENTIL HFA;VENTOLIN HFA) 108 (90 Base) MCG/ACT inhaler Inhale 2 puffs into the lungs every 6 (six) hours as needed for wheezing or shortness of breath.      budesonide-formoterol (SYMBICORT) 160-4.5 MCG/ACT inhaler Inhale 2 puffs into the lungs 2 (two) times daily.     cyclobenzaprine (FLEXERIL) 10 MG tablet Take 10 mg by mouth 3 (three) times daily as needed for muscle spasms.     diphenhydrAMINE (BENADRYL) 25 MG tablet Take 50 mg by mouth at bedtime as needed for allergies.     diphenhydramine-acetaminophen (TYLENOL PM) 25-500 MG TABS tablet Take 2 tablets by mouth at bedtime as needed (sleep).     DULoxetine (CYMBALTA) 60 MG capsule Take 1 capsule (60 mg total) by mouth daily. 90 capsule 3   gabapentin (NEURONTIN) 600 MG tablet Take 1 tablet (600 mg total) by mouth 3 (three) times daily. 180 tablet 3   loratadine (CLARITIN) 10 MG tablet Take 10 mg by mouth daily as needed for allergies or rhinitis.     naproxen (NAPROSYN) 375 MG tablet Take 1 tablet twice daily as needed for shoulder pain. 20 tablet 0   levETIRAcetam (KEPPRA) 500 MG tablet Take 1 tablet (500 mg total) by mouth 2 (two) times daily. 60 tablet 3   diazePAM (VALTOCO 10 MG DOSE)  10 MG/0.1ML LIQD Place 20 mg into the nose as needed for active seizure (Patient not taking: Reported on 11/19/2022) 4 each 5   No facility-administered medications prior to visit.    PAST MEDICAL HISTORY: Past Medical History:  Diagnosis Date   Asthma    COPD (chronic obstructive pulmonary disease) (HCC)    Seizures (HCC)    TBI (traumatic brain injury) (HCC)    age 52    PAST SURGICAL HISTORY: History reviewed. No pertinent surgical history.  FAMILY HISTORY: Family History   Problem Relation Age of Onset   Hypertension Mother    Hypertension Father     SOCIAL HISTORY: Social History   Socioeconomic History   Marital status: Single    Spouse name: Not on file   Number of children: Not on file   Years of education: Not on file   Highest education level: Not on file  Occupational History   Not on file  Tobacco Use   Smoking status: Former    Packs/day: 1.00    Years: 16.00    Additional pack years: 0.00    Total pack years: 16.00    Types: Cigarettes    Quit date: 11/20/2016    Years since quitting: 6.3   Smokeless tobacco: Never   Tobacco comments:    quit 2 yrs ago   Vaping Use   Vaping Use: Former   Quit date: 05/20/2018   Substances: Nicotine, Mixture of cannabinoids  Substance and Sexual Activity   Alcohol use: No   Drug use: Not Currently    Types: Marijuana    Comment: quit 2 yrs ago    Sexual activity: Yes    Birth control/protection: None  Other Topics Concern   Not on file  Social History Narrative   Not on file   Social Determinants of Health   Financial Resource Strain: Not on file  Food Insecurity: No Food Insecurity (11/25/2022)   Hunger Vital Sign    Worried About Running Out of Food in the Last Year: Never true    Ran Out of Food in the Last Year: Never true  Transportation Needs: No Transportation Needs (11/25/2022)   PRAPARE - Administrator, Civil Service (Medical): No    Lack of Transportation (Non-Medical): No  Physical Activity: Not on file  Stress: Not on file  Social Connections: Not on file  Intimate Partner Violence: Not At Risk (11/25/2022)   Humiliation, Afraid, Rape, and Kick questionnaire    Fear of Current or Ex-Partner: No    Emotionally Abused: No    Physically Abused: No    Sexually Abused: No    PHYSICAL EXAM  GENERAL EXAM/CONSTITUTIONAL: Vitals:  Vitals:   04/06/23 1425  BP: 112/86  Pulse: 98  Weight: 163 lb (73.9 kg)  Height: 6' (1.829 m)   Body mass index is 22.11  kg/m. Wt Readings from Last 3 Encounters:  04/06/23 163 lb (73.9 kg)  11/25/22 160 lb 15 oz (73 kg)  11/19/22 149 lb (67.6 kg)   Patient is in no distress; well developed, nourished and groomed; neck is supple  MUSCULOSKELETAL: Gait, strength, tone, movements noted in Neurologic exam below  NEUROLOGIC: MENTAL STATUS:      No data to display         awake, alert, oriented to person, place and time recent and remote memory intact normal attention and concentration language fluent, comprehension intact, naming intact fund of knowledge appropriate  CRANIAL NERVE:  2nd, 3rd, 4th, 6th -  Visual fields full to confrontation, extraocular muscles intact, no nystagmus 5th - facial sensation symmetric 7th - facial strength symmetric 8th - hearing intact 9th - palate elevates symmetrically, uvula midline 11th - shoulder shrug symmetric 12th - tongue protrusion midline  MOTOR:  normal bulk and tone, full strength in the BUE, BLE  SENSORY:  normal and symmetric to light touch  COORDINATION:  finger-nose-finger, fine finger movements normal  GAIT/STATION:  normal   DIAGNOSTIC DATA (LABS, IMAGING, TESTING) - I reviewed patient records, labs, notes, testing and imaging myself where available.  Lab Results  Component Value Date   WBC 5.9 11/25/2022   HGB 15.2 11/25/2022   HCT 45.4 11/25/2022   MCV 93.4 11/25/2022   PLT 259 11/25/2022      Component Value Date/Time   NA 135 11/25/2022 0638   K 3.4 (L) 11/25/2022 0638   CL 100 11/25/2022 0638   CO2 25 11/25/2022 0638   GLUCOSE 104 (H) 11/25/2022 0638   BUN 16 11/25/2022 0638   CREATININE 0.92 11/25/2022 0638   CREATININE 0.99 09/23/2016 1616   CALCIUM 8.8 (L) 11/25/2022 0638   PROT 7.4 11/25/2022 0638   ALBUMIN 4.0 11/25/2022 0638   AST 21 11/25/2022 0638   ALT 22 11/25/2022 0638   ALKPHOS 48 11/25/2022 0638   BILITOT 0.8 11/25/2022 0638   GFRNONAA >60 11/25/2022 0638   GFRAA >60 07/18/2020 1130   No results  found for: "CHOL", "HDL", "LDLCALC", "LDLDIRECT", "TRIG" No results found for: "HGBA1C" Lab Results  Component Value Date   VITAMINB12 689 07/28/2014   Lab Results  Component Value Date   TSH 0.01 (L) 09/23/2016    MRI Brain 12/21/2021 1. No acute intracranial abnormality.   EEG 07/30/2014 This normal EEG is recorded in the waking and sleep state. There was no seizure or seizure predisposition recorded on this study.    I personally reviewed brain Images and previous EEG reports.   ASSESSMENT AND PLAN  52 y.o. year old male  with history of traumatic brain injury, epilepsy, COPD who is presenting for management of his epilepsy.  He is currently on Keppra 500 mg twice daily, denies any seizure since December 2024 but has side effect of irritability, dry mouth and anger.  Plan will be to discontinue Keppra and start patient on valproic acid 750 mg twice daily.  Advised him to stop by the office in 2 months to obtain level of Depakote and CMP.  He voiced understanding.  Also given him rescue medication Valtoco.  Patient understands to contact me if he has a breakthrough seizure.  I will see him in 6 months for follow-up or sooner if worse.   1. Nonintractable epilepsy without status epilepticus, unspecified epilepsy type (HCC)   2. Therapeutic drug monitoring     Patient Instructions  Discontinue Levetiracetam  Start Depakote 750 mg twice daily  Valtoco as rescue medications  We will obtain Depakote level and CMP in 2 months  Return in 6 months or sooner if worse    Per [facility] statutes, patients with seizures are not allowed to drive until they have been seizure-free for six months.  Other recommendations include using caution when using heavy equipment or power tools. Avoid working on ladders or at heights. Take showers instead of baths.  Do not swim alone.  Ensure the water temperature is not too high on the home water heater. Do not go swimming alone. Do not lock  yourself in a room alone (i.e. bathroom).  When caring for infants or small children, sit down when holding, feeding, or changing them to minimize risk of injury to the child in the event you have a seizure. Maintain good sleep hygiene. Avoid alcohol.  Also recommend adequate sleep, hydration, good diet and minimize stress.   During the Seizure  - First, ensure adequate ventilation and place patients on the floor on their left side  Loosen clothing around the neck and ensure the airway is patent. If the patient is clenching the teeth, do not force the mouth open with any object as this can cause severe damage - Remove all items from the surrounding that can be hazardous. The patient may be oblivious to what's happening and may not even know what he or she is doing. If the patient is confused and wandering, either gently guide him/her away and block access to outside areas - Reassure the individual and be comforting - Call 911. In most cases, the seizure ends before EMS arrives. However, there are cases when seizures may last over 3 to 5 minutes. Or the individual may have developed breathing difficulties or severe injuries. If a pregnant patient or a person with diabetes develops a seizure, it is prudent to call an ambulance. - Finally, if the patient does not regain full consciousness, then call EMS. Most patients will remain confused for about 45 to 90 minutes after a seizure, so you must use judgment in calling for help. - Avoid restraints but make sure the patient is in a bed with padded side rails - Place the individual in a lateral position with the neck slightly flexed; this will help the saliva drain from the mouth and prevent the tongue from falling backward - Remove all nearby furniture and other hazards from the area - Provide verbal assurance as the individual is regaining consciousness - Provide the patient with privacy if possible - Call for help and start treatment as ordered by the  caregiver   After the Seizure (Postictal Stage)  After a seizure, most patients experience confusion, fatigue, muscle pain and/or a headache. Thus, one should permit the individual to sleep. For the next few days, reassurance is essential. Being calm and helping reorient the person is also of importance.  Most seizures are painless and end spontaneously. Seizures are not harmful to others but can lead to complications such as stress on the lungs, brain and the heart. Individuals with prior lung problems may develop labored breathing and respiratory distress.     Orders Placed This Encounter  Procedures   Valproic Acid Level   CMP   EEG adult    Meds ordered this encounter  Medications   divalproex (DEPAKOTE) 250 MG DR tablet    Sig: Take 3 tablets (750 mg total) by mouth 2 (two) times daily.    Dispense:  360 tablet    Refill:  3   diazePAM, 15 MG Dose, (VALTOCO 15 MG DOSE) 2 x 7.5 MG/0.1ML LQPK    Sig: Place 15 mg into the nose as needed (For seizure lasting more than 2 minutes).    Dispense:  5 each    Refill:  1    Return in about 6 months (around 10/07/2023).    Windell Norfolk, MD 04/07/2023, 6:24 PM  [facility] [address] [place], [place] [address] [phone]

## 2023-04-07 NOTE — Patient Instructions (Signed)
Discontinue Levetiracetam  Start Depakote 750 mg twice daily  Valtoco as rescue medications  We will obtain Depakote level and CMP in 2 months  Return in 6 months or sooner if worse

## 2023-04-27 ENCOUNTER — Ambulatory Visit: Payer: Medicaid Other | Admitting: Neurology

## 2023-04-27 DIAGNOSIS — G40909 Epilepsy, unspecified, not intractable, without status epilepticus: Secondary | ICD-10-CM | POA: Diagnosis not present

## 2023-04-27 NOTE — Procedures (Signed)
    History:  52 year old man with epilepsy   EEG classification: Awake and drowsy  Description of the recording: The background rhythms of this recording consists of a fairly well modulated medium amplitude alpha rhythm of 11 Hz that is reactive to eye opening and closure. Present in the anterior head region is a 15-20 Hz beta activity. Photic stimulation was performed, did not show any abnormalities. Hyperventilation was also performed, did not show any abnormalities. Drowsiness was manifested by background fragmentation. No abnormal epileptiform discharges seen during this recording. There was no focal slowing. There were no electrographic seizure identified.   Abnormality: None   Impression: This is a normal EEG recorded while drowsy and awake. No evidence of interictal epileptiform discharges. Normal EEGs, however, do not rule out epilepsy.    Windell Norfolk, MD [facility]

## 2023-04-28 ENCOUNTER — Encounter: Payer: Self-pay | Admitting: *Deleted

## 2023-06-06 ENCOUNTER — Emergency Department (HOSPITAL_COMMUNITY)
Admission: EM | Admit: 2023-06-06 | Discharge: 2023-06-06 | Disposition: A | Discharge status: Home / Self Care | Payer: Medicaid Other | Attending: Emergency Medicine | Admitting: Emergency Medicine

## 2023-06-06 ENCOUNTER — Encounter (HOSPITAL_COMMUNITY): Payer: Self-pay | Admitting: Emergency Medicine

## 2023-06-06 ENCOUNTER — Emergency Department (HOSPITAL_COMMUNITY): Payer: Medicaid Other

## 2023-06-06 DIAGNOSIS — M545 Low back pain, unspecified: Secondary | ICD-10-CM | POA: Diagnosis present

## 2023-06-06 DIAGNOSIS — M5441 Lumbago with sciatica, right side: Secondary | ICD-10-CM | POA: Insufficient documentation

## 2023-06-06 DIAGNOSIS — M5442 Lumbago with sciatica, left side: Secondary | ICD-10-CM | POA: Diagnosis not present

## 2023-06-06 DIAGNOSIS — R569 Unspecified convulsions: Secondary | ICD-10-CM | POA: Diagnosis not present

## 2023-06-06 LAB — CBC WITH DIFFERENTIAL/PLATELET
Abs Immature Granulocytes: 0.02 10*3/uL (ref 0.00–0.07)
Basophils Absolute: 0 10*3/uL (ref 0.0–0.1)
Basophils Relative: 0 %
Eosinophils Absolute: 0.2 10*3/uL (ref 0.0–0.5)
Eosinophils Relative: 2 %
HCT: 44 % (ref 39.0–52.0)
Hemoglobin: 14.7 g/dL (ref 13.0–17.0)
Immature Granulocytes: 0 %
Lymphocytes Relative: 24 %
Lymphs Abs: 2.3 10*3/uL (ref 0.7–4.0)
MCH: 31 pg (ref 26.0–34.0)
MCHC: 33.4 g/dL (ref 30.0–36.0)
MCV: 92.8 fL (ref 80.0–100.0)
Monocytes Absolute: 1.1 10*3/uL — ABNORMAL HIGH (ref 0.1–1.0)
Monocytes Relative: 11 %
Neutro Abs: 6.3 10*3/uL (ref 1.7–7.7)
Neutrophils Relative %: 63 %
Platelets: 332 10*3/uL (ref 150–400)
RBC: 4.74 MIL/uL (ref 4.22–5.81)
RDW: 13 % (ref 11.5–15.5)
WBC: 9.9 10*3/uL (ref 4.0–10.5)
nRBC: 0 % (ref 0.0–0.2)

## 2023-06-06 LAB — BASIC METABOLIC PANEL
Anion gap: 9 (ref 5–15)
BUN: 6 mg/dL (ref 6–20)
CO2: 24 mmol/L (ref 22–32)
Calcium: 9.2 mg/dL (ref 8.9–10.3)
Chloride: 106 mmol/L (ref 98–111)
Creatinine, Ser: 1.02 mg/dL (ref 0.61–1.24)
GFR, Estimated: >60 mL/min (ref >60)
Glucose, Bld: 87 mg/dL (ref 70–99)
Potassium: 3.7 mmol/L (ref 3.5–5.1)
Sodium: 139 mmol/L (ref 135–145)

## 2023-06-06 MED ORDER — OXYCODONE HCL 5 MG PO TABS
10.0000 mg | ORAL_TABLET | Freq: Once | ORAL | Status: AC
  Administered 2023-06-06: 10 mg via ORAL
  Filled 2023-06-06: qty 2

## 2023-06-06 MED ORDER — LIDOCAINE 5 % EX PTCH: 1.0000 | MEDICATED_PATCH | CUTANEOUS | 0 refills | Status: DC

## 2023-06-06 MED ORDER — KETOROLAC TROMETHAMINE 15 MG/ML IJ SOLN
15.0000 mg | Freq: Once | INTRAMUSCULAR | Status: AC
  Administered 2023-06-06: 15 mg via INTRAVENOUS
  Filled 2023-06-06: qty 1

## 2023-06-06 MED ORDER — CELECOXIB 400 MG PO CAPS: 400.0000 mg | ORAL_CAPSULE | Freq: Two times a day (BID) | ORAL | 0 refills | Status: AC

## 2023-06-06 MED ORDER — LEVETIRACETAM IN NACL 500 MG/100ML IV SOLN
500.0000 mg | Freq: Once | INTRAVENOUS | Status: AC
  Administered 2023-06-06: 500 mg via INTRAVENOUS
  Filled 2023-06-06: qty 100

## 2023-06-06 MED ORDER — ACETAMINOPHEN 500 MG PO TABS
1000.0000 mg | ORAL_TABLET | Freq: Once | ORAL | Status: AC
  Administered 2023-06-06: 1000 mg via ORAL
  Filled 2023-06-06: qty 2

## 2023-06-06 MED ORDER — LIDOCAINE 5 % EX PTCH
1.0000 | MEDICATED_PATCH | CUTANEOUS | Status: DC
  Administered 2023-06-06: 1 via TRANSDERMAL
  Filled 2023-06-06: qty 1

## 2023-06-06 NOTE — ED Provider Notes (Signed)
[place] [facility] AT [facility] Provider Note   CSN: [id number] Arrival date & time: 06/06/23  1326     History  Chief Complaint  Patient presents with   Back Pain    Wayne Hawkins is a 52 y.o. male.  52 year old male with prior medical history as detailed below presents for evaluation.  Patient complains of acute on chronic left-sided low back pain.  Patient reports that he may have had a seizure last night.  He thinks that he fell out of bed secondary to seizure.  He cannot recall specifically having a seizure.  He reports that he woke up on the floor.  Patient reports that he has increased acute pain to his left low back.  Patient with history of chronic back pain and seizures.  Patient reports compliance with Keppra.  He reports that his last dose of Keppra was yesterday evening.  He has not yet taken his morning dose of Keppra.  Patient was seen by orthopedics for evaluation of his chronic back pain 2 days ago on the 12th.  Patient is followed by [facility] neurology in the outpatient setting.  Patient with history of TBI as a child, seizure disorder, chronic back pain.  The history is provided by the patient and medical records.       Home Medications Prior to Admission medications   Medication Sig Start Date End Date Taking? Authorizing Provider  albuterol (PROVENTIL HFA;VENTOLIN HFA) 108 (90 Base) MCG/ACT inhaler Inhale 2 puffs into the lungs every 6 (six) hours as needed for wheezing or shortness of breath.  12/25/18   [provider]  budesonide-formoterol (SYMBICORT) 160-4.5 MCG/ACT inhaler Inhale 2 puffs into the lungs 2 (two) times daily.    [provider]  cyclobenzaprine (FLEXERIL) 10 MG tablet Take 10 mg by mouth 3 (three) times daily as needed for muscle spasms.    [provider]  diazePAM, 15 MG Dose, (VALTOCO 15 MG DOSE) 2 x 7.5 MG/0.1ML LQPK Place 15 mg into the nose as needed (For seizure lasting more than 2  minutes). 04/06/23   Windell Norfolk, MD  diphenhydrAMINE (BENADRYL) 25 MG tablet Take 50 mg by mouth at bedtime as needed for allergies.    [provider]  diphenhydramine-acetaminophen (TYLENOL PM) 25-500 MG TABS tablet Take 2 tablets by mouth at bedtime as needed (sleep).    [provider]  divalproex (DEPAKOTE) 250 MG DR tablet Take 3 tablets (750 mg total) by mouth 2 (two) times daily. 04/06/23 12/02/23  Windell Norfolk, MD  DULoxetine (CYMBALTA) 60 MG capsule Take 1 capsule (60 mg total) by mouth daily. 02/14/23   Adron Bene, MD  gabapentin (NEURONTIN) 600 MG tablet Take 1 tablet (600 mg total) by mouth 3 (three) times daily. 02/14/23   Adron Bene, MD  loratadine (CLARITIN) 10 MG tablet Take 10 mg by mouth daily as needed for allergies or rhinitis.    [provider]  naproxen (NAPROSYN) 375 MG tablet Take 1 tablet twice daily as needed for shoulder pain. 09/29/22   Molpus, Jonny Ruiz, MD      Allergies    Patient has no known allergies.    Review of Systems   Review of Systems  All other systems reviewed and are negative.   Physical Exam Updated Vital Signs BP 115/78 (BP Location: Right Arm)   Pulse 93   Temp 97.6 F (36.4 C) (Oral)   Resp 18   SpO2 97%  Physical Exam Vitals and nursing note reviewed.  Constitutional:      General: He is not in acute distress.    Appearance: Normal appearance. He is well-developed.  HENT:     Head: Normocephalic and atraumatic.  Eyes:     Conjunctiva/sclera: Conjunctivae normal.     Pupils: Pupils are equal, round, and reactive to light.  Cardiovascular:     Rate and Rhythm: Normal rate and regular rhythm.     Heart sounds: Normal heart sounds.  Pulmonary:     Effort: Pulmonary effort is normal. No respiratory distress.     Breath sounds: Normal breath sounds.  Abdominal:     General: There is no distension.     Palpations: Abdomen is soft.     Tenderness: There is no abdominal tenderness.   Musculoskeletal:        General: Tenderness present. No deformity. Normal range of motion.     Cervical back: Normal range of motion and neck supple.     Comments: Moderate tenderness of the left low lumbar back paraspinal musculature  No midline spinal tenderness.  Skin:    General: Skin is warm and dry.  Neurological:     General: No focal deficit present.     Mental Status: He is alert and oriented to person, place, and time.     Comments: Alert and oriented x 4, no focal deficit, no postictal state, moves all 4 extremities equally.     ED Results / Procedures / Treatments   Labs (all labs ordered are listed, but only abnormal results are displayed) Labs Reviewed  CBC WITH DIFFERENTIAL/PLATELET  BASIC METABOLIC PANEL  LEVETIRACETAM LEVEL    EKG None  Radiology No results found.  Procedures Procedures    Medications Ordered in ED Medications  ketorolac (TORADOL) 15 MG/ML injection 15 mg (has no administration in time range)  lidocaine (LIDODERM) 5 % 1 patch (has no administration in time range)  levETIRAcetam (KEPPRA) IVPB 500 mg/100 mL premix (has no administration in time range)    ED Course/ Medical Decision Making/ A&P Clinical Course as of 06/07/23 2246  Sun Jun 06, 2023  1503 Stable TBI 69 YOF with a chief complaint of fall Left flank pain. XR ok. Basics labs pending if negative ok to DC. [CC]    Clinical Course User Index [CC] Glyn Ade, MD                             Medical Decision Making Amount and/or Complexity of Data Reviewed Labs: ordered. Radiology: ordered.  Risk OTC drugs. Prescription drug management.     Medical Screen Complete  This patient presented to the ED with complaint of possible seizure, low back pain.  This complaint involves an extensive number of treatment options. The initial differential diagnosis includes, but is not limited to, metabolic abnormality, possible breakthrough seizure, acute on chronic back  pain secondary to muscular contusion, strain, fracture, etc.  This presentation is: Acute, Chronic, Self-Limited, Previously Undiagnosed, Uncertain Prognosis, Complicated, Systemic Symptoms, and Threat to Life/Bodily Function  Patient with known history of chronic back pain, seizure disorder presents after possible seizure that occurred overnight.  Patient reports that he fell out of bed and injured his low back.  He thinks that he may have had a seizure to cause himself to fall out of bed.  Primary complaint on arrival of left-sided low back paraspinal pain.  Initial screening labs and imaging ordered.  Oncoming ED provider aware of case.  Additional  history obtained:  External records from outside sources obtained and reviewed including prior ED visits and prior Inpatient records.    Lab Tests:  I ordered and personally interpreted labs.   Problem List / ED Course:  Back pain   Reevaluation:  After the interventions noted above, I reevaluated the patient and found that they have: improved   Disposition:  After consideration of the diagnostic results and the patients response to treatment, I feel that the patent would benefit from completion of ED evaluation.          Final Clinical Impression(s) / ED Diagnoses Final diagnoses:  Acute low back pain with bilateral sciatica, unspecified back pain laterality    Rx / DC Orders ED Discharge Orders     None         Wynetta Fines, MD 06/07/23 2248

## 2023-06-06 NOTE — ED Provider Notes (Signed)
Care of patient received from prior provider at 5:15 PM, please see their note for complete H/P and care plan.  Received handoff per ED course.  Clinical Course as of 06/06/23 1715  Sun Jun 06, 2023  1503 Stable TBI 72 YOF with a chief complaint of fall Left flank pain. XR ok. Basics labs pending if negative ok to DC. [CC]    Clinical Course User Index [CC] Glyn Ade, MD    Reassessment: Evaluated bedside.  Patient is in no acute discomfort.  After treatment with medications states his pain is under control.  He needs to follow-up with a spine provider for another fall event his neurologist for another seizure event but is currently stabilized back to his baseline stable for outpatient care and management.     Glyn Ade, MD 06/06/23 1715

## 2023-06-06 NOTE — ED Triage Notes (Signed)
Pt BIB [facility] from home after c/o of lower back pain that radiate to the left leg. Patient thinks he had a seizure last night after falling out of bed, not sure the time it happen. EMS reported the his back pain is progressively worse. Hx of chronic back pain and epilepsy. No new numbness and tingling. Patient is A & O x 4. Vital signs: BP 116 74, HR 106, RR 21, saturation 95% RA.

## 2023-06-07 ENCOUNTER — Other Ambulatory Visit: Payer: Self-pay

## 2023-06-07 ENCOUNTER — Emergency Department (HOSPITAL_COMMUNITY)
Admission: EM | Admit: 2023-06-07 | Discharge: 2023-06-07 | Disposition: A | Discharge status: Home / Self Care | Payer: Medicaid Other | Attending: Emergency Medicine | Admitting: Emergency Medicine

## 2023-06-07 DIAGNOSIS — M545 Low back pain, unspecified: Secondary | ICD-10-CM | POA: Diagnosis present

## 2023-06-07 DIAGNOSIS — J449 Chronic obstructive pulmonary disease, unspecified: Secondary | ICD-10-CM | POA: Diagnosis not present

## 2023-06-07 DIAGNOSIS — R454 Irritability and anger: Secondary | ICD-10-CM | POA: Insufficient documentation

## 2023-06-07 DIAGNOSIS — Z5321 Procedure and treatment not carried out due to patient leaving prior to being seen by health care provider: Secondary | ICD-10-CM | POA: Insufficient documentation

## 2023-06-07 DIAGNOSIS — M79605 Pain in left leg: Secondary | ICD-10-CM | POA: Insufficient documentation

## 2023-06-07 DIAGNOSIS — R451 Restlessness and agitation: Secondary | ICD-10-CM | POA: Insufficient documentation

## 2023-06-07 DIAGNOSIS — Z5329 Procedure and treatment not carried out because of patient's decision for other reasons: Secondary | ICD-10-CM

## 2023-06-07 DIAGNOSIS — Z7951 Long term (current) use of inhaled steroids: Secondary | ICD-10-CM | POA: Insufficient documentation

## 2023-06-07 DIAGNOSIS — R000 Tachycardia, unspecified: Secondary | ICD-10-CM | POA: Diagnosis not present

## 2023-06-07 DIAGNOSIS — M5442 Lumbago with sciatica, left side: Secondary | ICD-10-CM | POA: Insufficient documentation

## 2023-06-07 LAB — LEVETIRACETAM LEVEL: Levetiracetam Lvl: <2 ug/mL — ABNORMAL LOW (ref 10.0–40.0)

## 2023-06-07 MED ORDER — OXYCODONE HCL 5 MG PO TABS
10.0000 mg | ORAL_TABLET | Freq: Once | ORAL | Status: AC
  Administered 2023-06-07: 10 mg via ORAL
  Filled 2023-06-07: qty 2

## 2023-06-07 MED ORDER — KETOROLAC TROMETHAMINE 30 MG/ML IJ SOLN
30.0000 mg | Freq: Once | INTRAMUSCULAR | Status: AC
  Administered 2023-06-07: 30 mg via INTRAMUSCULAR
  Filled 2023-06-07: qty 1

## 2023-06-07 NOTE — ED Provider Notes (Signed)
[place] EMERGENCY DEPARTMENT AT [facility] Provider Note   CSN: [id number] Arrival date & time: 06/07/23  1613     History  Chief Complaint  Patient presents with   Leg Pain    Wayne Hawkins is a 52 y.o. male with history of seizure on Keppra, anxiety, COPD, right-sided sciatica, chronic low back pain, TBI who presents the emergency department complaining of left leg pain and left lower back pain.  Patient states while he does have a history of sciatica, his pain is significantly worse starting this morning.  He fell while in the bathroom.  Tried taking a Tylenol this morning without relief.  States the pain radiates from his left lower back down to his foot, but pain is worse in his left buttocks and left upper posterior thigh.  Denies any numbness or tingling in his legs or groin.  Has some pain in his groin.  Also describing new weakness, states that when he tries to stand his left leg buckles, which is what caused him to fall this morning.  He states he is feeling very anxious and agitated that he will lose permanent function of his left leg.  Patient notes that he went to his orthopedist 3 days ago, and states that they did an examination in which they made him walk around the room in a particular way.  He states that this aggravated his pain, and it has been getting progressively worse, however only became severe enough to present to the hospital today.  Patient was in the hospital yesterday, but states this was related to his epilepsy.  He reports that he tried to contact his primary care doctor today, and they recommended that he come to the ER for evaluation.   Leg Pain Associated symptoms: back pain        Home Medications Prior to Admission medications   Medication Sig Start Date End Date Taking? Authorizing Provider  albuterol (PROVENTIL HFA;VENTOLIN HFA) 108 (90 Base) MCG/ACT inhaler Inhale 2 puffs into the lungs every 6 (six) hours as needed for wheezing or  shortness of breath.  12/25/18   [provider]  budesonide-formoterol (SYMBICORT) 160-4.5 MCG/ACT inhaler Inhale 2 puffs into the lungs 2 (two) times daily.    [provider]  celecoxib (CELEBREX) 400 MG capsule Take 1 capsule (400 mg total) by mouth 2 (two) times daily for 10 days. 06/06/23 06/16/23  Glyn Ade, MD  cyclobenzaprine (FLEXERIL) 10 MG tablet Take 10 mg by mouth 3 (three) times daily as needed for muscle spasms.    [provider]  diazePAM, 15 MG Dose, (VALTOCO 15 MG DOSE) 2 x 7.5 MG/0.1ML LQPK Place 15 mg into the nose as needed (For seizure lasting more than 2 minutes). 04/06/23   Windell Norfolk, MD  diphenhydrAMINE (BENADRYL) 25 MG tablet Take 50 mg by mouth at bedtime as needed for allergies.    [provider]  diphenhydramine-acetaminophen (TYLENOL PM) 25-500 MG TABS tablet Take 2 tablets by mouth at bedtime as needed (sleep).    [provider]  divalproex (DEPAKOTE) 250 MG DR tablet Take 3 tablets (750 mg total) by mouth 2 (two) times daily. 04/06/23 12/02/23  Windell Norfolk, MD  DULoxetine (CYMBALTA) 60 MG capsule Take 1 capsule (60 mg total) by mouth daily. 02/14/23   Adron Bene, MD  gabapentin (NEURONTIN) 600 MG tablet Take 1 tablet (600 mg total) by mouth 3 (three) times daily. 02/14/23   Adron Bene, MD  lidocaine (LIDODERM) 5 % Place  1 patch onto the skin daily. Remove & Discard patch within 12 hours or as directed by MD 06/06/23   Glyn Ade, MD  loratadine (CLARITIN) 10 MG tablet Take 10 mg by mouth daily as needed for allergies or rhinitis.    [provider]      Allergies    Patient has no known allergies.    Review of Systems   Review of Systems  Genitourinary:  Negative for difficulty urinating and dysuria.  Musculoskeletal:  Positive for back pain.  Neurological:  Positive for weakness. Negative for numbness.  All other systems reviewed and are negative.   Physical Exam Updated  Vital Signs BP 130/88   Pulse 93   Temp 98.6 F (37 C) (Oral)   Resp 18   SpO2 98%  Physical Exam Vitals and nursing note reviewed.  Constitutional:      Appearance: Normal appearance.  HENT:     Head: Normocephalic and atraumatic.  Eyes:     Conjunctiva/sclera: Conjunctivae normal.  Pulmonary:     Effort: Pulmonary effort is normal. No respiratory distress.  Musculoskeletal:     Comments: 5/5 strength on the right leg to hip flexion, extension, ankle dorsi/plantarflexion.  4/5 strength on the left.  No midline spinal tenderness, step-offs or crepitus.  Pelvis is stable.  Normal sensation bilateral lower extremities.  Skin:    General: Skin is warm and dry.  Neurological:     Mental Status: He is alert.  Psychiatric:        Attention and Perception: Attention and perception normal.        Mood and Affect: Mood normal. Affect is angry.        Speech: Speech is rapid and pressured.        Behavior: Behavior is agitated.     ED Results / Procedures / Treatments   Labs (all labs ordered are listed, but only abnormal results are displayed) Labs Reviewed - No data to display  EKG None  Radiology DG Lumbar Spine Complete  Result Date: 06/06/2023 CLINICAL DATA:  Pain after fall. EXAM: LUMBAR SPINE - COMPLETE 4+ VIEW COMPARISON:  July 18, 2020 FINDINGS: There is no evidence of lumbar spine fracture. Alignment is normal. Minimal spondylosis at most levels below T12-L1. Moderate posterior facet arthropathy. IMPRESSION: 1. No acute fracture or dislocation identified about the lumbar spine. 2. Minimal spondylosis at most levels below T12-L1. Electronically Signed   By: Ted Mcalpine M.D.   On: 06/06/2023 14:48    Procedures Procedures    Medications Ordered in ED Medications  ketorolac (TORADOL) 30 MG/ML injection 30 mg (30 mg Intramuscular Given 06/07/23 1839)  oxyCODONE (Oxy IR/ROXICODONE) immediate release tablet 10 mg (10 mg Oral Given 06/07/23 1839)    ED Course/  Medical Decision Making/ A&P Clinical Course as of 06/07/23 1913  Mon Jun 07, 2023  1836 Staff advised patient is requesting to leave. He states that he has "been here for 2 hours and no one cares about me". Pain medication had already been ordered and I was in the process of ordering the patient MRI imaging when he requested to leave.  [LR]  1610 Patient would like his ordered pain medication prior to leaving, will sign AMA.  [LR]    Clinical Course User Index [LR] Cleopatra Sardo, Lora Paula, PA-C                             Medical Decision Making Risk  Prescription drug management.   This patient is a 52 y.o. male  who presents to the ED for concern of low back pain and left leg pain.   Differential diagnoses prior to evaluation: The emergent differential diagnosis includes, but is not limited to,  Fracture (acute/chronic), muscle strain, cauda equina / myelopathy, spinal stenosis, DDD, ligamentous injury, disk herniation, radiculopathy. This is not an exhaustive differential.   Past Medical History / Co-morbidities / Social History: seizure on Keppra, anxiety, COPD, right-sided sciatica, chronic low back pain, TBI  Additional history: Chart reviewed. Pertinent results include: MRI lumbar spine from December 2023 after a seizure with fall showed right disc protrusion at L2-L3, central disc protrusion at L3-L4, right disc protrustion L4-L5, and small disc bulge at L5-S1.   Reviewed ER visit from yesterday in which patient presented of low back pain and possible seizure. Had reassuring laboratory evaluation, received pain medication and keppra load. Discharged with celecoxib and lidocaine patches.   Last visit with orthopedics on 7/12, patient reports he was asked to walk in a specific way that led to worsening back pain. Followed by [facility] for epilepsy, reports compliance on Keppra.   Physical Exam: Physical exam performed. The pertinent findings include: Initially tachycardic to  114, improved to the 90s.  Normotensive.  Normal respiratory effort.  Patient with angry affect, and agitated.  He is very upset about the amount of pain that he is then and his weight while in the ER.  No saddle paresthesias, urinary retention, urine or bowel incontinence to suggest cauda equina/myelopathy.    Lab Tests/Imaging studies: I was in the process of ordering patient an MRI lumbar spine to evaluate his low back pain with left leg new weakness, however patient elected to leave AGAINST MEDICAL ADVICE before this was able to be ordered or imaging obtained.  Medications: I ordered medication including Toradol and Roxicodone.  I have reviewed the patients home medicines and have made adjustments as needed.   Disposition: Patient elected to leave the emergency department AGAINST MEDICAL ADVICE. ER staff discussed the nature and purpose, risks and benefits, as well as, the alternatives of treatment. Time was given to allow the opportunity to ask questions and consider their options, and after the discussion, the patient decided to refuse the offerred treatment. The patient was informed that refusal could lead to, but was not limited to, death, permanent disability, or severe pain. If present, I asked the relatives or significant others to dissuade them without success. Prior to refusing, I determined that the patient had the capacity to make their decision and understood the consequences of that decision. After refusal, I made every reasonable opportunity to treat them to the best of my ability.  The patient was notified that they may return to the emergency department at any time for further treatment.    Final Clinical Impression(s) / ED Diagnoses Final diagnoses:  Acute left-sided low back pain with left-sided sciatica  Left against medical advice    Rx / DC Orders ED Discharge Orders     None      Portions of this report may have been transcribed using voice recognition software.  Every effort was made to ensure accuracy; however, inadvertent computerized transcription errors may be present.    Su Monks, PA-C 06/07/23 1914    Rozelle Logan, DO 06/24/23 1610

## 2023-06-07 NOTE — ED Triage Notes (Signed)
EMS reports from home c/o sciatic pain, (Left leg) seen yesterday for same. Hx seizures. Pt states he took Tylenol this morning with no relief.  BP 140/100 HR 120 RR 26 Sp02 98 RA

## 2023-06-08 ENCOUNTER — Other Ambulatory Visit: Payer: Self-pay | Admitting: Neurology

## 2023-06-08 ENCOUNTER — Other Ambulatory Visit (HOSPITAL_COMMUNITY): Payer: Self-pay

## 2023-06-08 ENCOUNTER — Telehealth: Payer: Self-pay | Admitting: Neurology

## 2023-06-08 MED ORDER — OXCARBAZEPINE 300 MG PO TABS
300.0000 mg | ORAL_TABLET | Freq: Two times a day (BID) | ORAL | 6 refills | Status: DC
  Filled 2023-06-08: qty 60, 30d supply, fill #0

## 2023-06-08 NOTE — Telephone Encounter (Signed)
Continue with Depakote 500 mg in the morning only and start Trileptal 300 mg BID for one week then stop the Depakote

## 2023-06-08 NOTE — Telephone Encounter (Signed)
 Mychart message sent.

## 2023-06-08 NOTE — Telephone Encounter (Signed)
Pt stated it had a seizure on Sunday and ended up at hospital. Pt stated he having trouble with left leg. Pt stated he is in the worse pain of his life and needs to talk to nurse about this pain.

## 2023-06-08 NOTE — Telephone Encounter (Signed)
He is having abdominal issues with the depakote, is he taking the medication or nor? We will switch him to Trileptal. 300 mg twice daily and he should contact us next time, he has a seizure.

## 2023-06-08 NOTE — Telephone Encounter (Signed)
Call back to patient, he reports having a grand mal seizure on Sunday and went to the ER. He is not sure if he fell down or fell out if the bed. He does report severe back and left leg pain but denies swelling, redness, or streaking. He is seeing his PCP this afternoon for the leg and back pain. He reports having 2-3 breakthrough seizures since being on the depakote. He requests to go back on Keppra. He denied SI/HI while being on Keppra but did stated he saome irritability while on it. Depakote causes stomach issues for him. Advised I will send to Dr. Teresa Coombs for review. Patient appreciative of call

## 2023-06-09 NOTE — Telephone Encounter (Signed)
Call to patient, reviewed medication changes and patient verbalized understanding and teach back method used. Patient also stated he would have sister read the mychart message and inform us of any seizures. No other questions or concerns. Appreciative of call.

## 2023-06-22 ENCOUNTER — Other Ambulatory Visit (HOSPITAL_COMMUNITY): Payer: Self-pay

## 2023-09-08 ENCOUNTER — Other Ambulatory Visit: Payer: Self-pay | Admitting: Neurology

## 2023-09-08 NOTE — Telephone Encounter (Signed)
He should not be on Keppra. I discontinue it last time, and put him on Depakote. He had side effect on Depakote and now, he should be Oxcarbazepine.

## 2023-09-08 NOTE — Telephone Encounter (Signed)
Shouldn't this be bid?

## 2023-10-12 ENCOUNTER — Encounter: Payer: Self-pay | Admitting: Neurology

## 2023-10-12 ENCOUNTER — Ambulatory Visit: Payer: Medicaid Other | Admitting: Neurology

## 2023-10-12 VITALS — BP 130/88 | HR 104 | Ht 72.0 in | Wt 163.0 lb

## 2023-10-12 DIAGNOSIS — G4700 Insomnia, unspecified: Secondary | ICD-10-CM

## 2023-10-12 DIAGNOSIS — G40909 Epilepsy, unspecified, not intractable, without status epilepticus: Secondary | ICD-10-CM | POA: Diagnosis not present

## 2023-10-12 DIAGNOSIS — F419 Anxiety disorder, unspecified: Secondary | ICD-10-CM | POA: Diagnosis not present

## 2023-10-12 MED ORDER — DULOXETINE HCL 30 MG PO CPEP: 30.0000 mg | ORAL_CAPSULE | Freq: Every day | ORAL | 11 refills | Status: DC

## 2023-10-12 MED ORDER — OXCARBAZEPINE 600 MG PO TABS: 300.0000 mg | ORAL_TABLET | Freq: Two times a day (BID) | ORAL | 6 refills | Status: AC

## 2023-10-12 MED ORDER — TRAZODONE HCL 50 MG PO TABS: 50.0000 mg | ORAL_TABLET | Freq: Every day | ORAL | 6 refills | Status: DC

## 2023-10-12 NOTE — Patient Instructions (Signed)
Increase oxcarbazepine to 600 mg twice daily Start trazodone 50 mg nightly for insomnia, please contact me in 2 weeks if the trazodone is not helpful Use duloxetine 30 mg daily for symptoms of anxiety/depression and pain Follow-up in 6 months or sooner if worse.
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
## 2023-12-09 ENCOUNTER — Other Ambulatory Visit (HOSPITAL_BASED_OUTPATIENT_CLINIC_OR_DEPARTMENT_OTHER): Payer: Self-pay

## 2023-12-16 ENCOUNTER — Telehealth: Payer: Self-pay | Admitting: Neurology

## 2023-12-16 NOTE — Telephone Encounter (Signed)
Called and spoke to pharmacy and they stated that they needed a new rx for valtoco:  Requested Prescriptions   Pending Prescriptions Disp Refills   diazePAM, 15 MG Dose, (VALTOCO 15 MG DOSE) 2 x 7.5 MG/0.1ML LQPK 5 each 1    Sig: Place 15 mg into the nose as needed (For seizure lasting more than 2 minutes).   Last seen 10/12/23, next appt 05/15/24  Dispenses   Dispensed Days Supply Quantity Provider Pharmacy  VALTOCO 15MG  NASAL SPR 2X7.5MG  2PK 04/07/2023 30 4 each Windell Norfolk, MD [facility] DR[facility] STORE #...      How do dispenses affect the score?

## 2023-12-16 NOTE — Telephone Encounter (Signed)
Pt called stating that he was informed that the pharmacy is needing to speak to the provider regarding his diazePAM, 15 MG Dose, (VALTOCO 15 MG DOSE) 2 x 7.5 MG/0.1ML LQPK  Please advise.

## 2023-12-17 MED ORDER — VALTOCO 15 MG DOSE 7.5 MG/0.1ML NA LQPK: 15.0000 mg | NASAL | 2 refills | Status: DC | PRN

## 2024-03-14 ENCOUNTER — Telehealth: Payer: Self-pay | Admitting: Neurology

## 2024-03-14 NOTE — Telephone Encounter (Signed)
 Yolotzin(operations Production designer, theatre/television/film) at pharmacy reports that there is no expected date available for this medication: VALTOCO  15 MG DOSE) 2 x 7.5 MG/0.1ML LQPK , she is unsure if Dr Samara Crest would like to try and order from another pharmacy or send in something else for pt, please advise

## 2024-03-15 NOTE — Telephone Encounter (Signed)
 Both patient and us  should check if medication is available at other [facility] or other pharmacy's.

## 2024-03-15 NOTE — Telephone Encounter (Signed)
 Lvm 1st attempt by hf to the pt 03/15/24 at 8:05am

## 2024-03-16 ENCOUNTER — Telehealth: Payer: Self-pay

## 2024-03-16 ENCOUNTER — Other Ambulatory Visit: Payer: Self-pay | Admitting: Neurology

## 2024-03-16 ENCOUNTER — Other Ambulatory Visit (HOSPITAL_COMMUNITY): Payer: Self-pay

## 2024-03-16 MED ORDER — VALTOCO 20 MG DOSE 2 X 10 MG/0.1ML NA LQPK
20.0000 mg | NASAL | 0 refills | Status: DC | PRN
  Filled 2024-03-16: qty 4, 2d supply, fill #0

## 2024-03-16 NOTE — Telephone Encounter (Signed)
 Call to patient, he would like the valtoco  sent to the [facility]

## 2024-03-16 NOTE — Telephone Encounter (Signed)
 Pt returned call. Please call back when available.

## 2024-03-16 NOTE — Telephone Encounter (Signed)
 Done. Thanks.

## 2024-03-28 ENCOUNTER — Other Ambulatory Visit (HOSPITAL_COMMUNITY): Payer: Self-pay

## 2024-03-28 MED ORDER — OMEPRAZOLE 40 MG PO CPDR
40.0000 mg | DELAYED_RELEASE_CAPSULE | Freq: Every day | ORAL | 2 refills | Status: DC
  Filled 2024-03-28: qty 30, 30d supply, fill #0
  Filled 2024-04-24: qty 30, 30d supply, fill #1
  Filled 2024-05-24: qty 30, 30d supply, fill #2

## 2024-03-29 ENCOUNTER — Other Ambulatory Visit (HOSPITAL_COMMUNITY): Payer: Self-pay

## 2024-03-29 MED ORDER — TRIAMCINOLONE ACETONIDE 0.1 % EX CREA
TOPICAL_CREAM | CUTANEOUS | 5 refills | Status: AC
  Filled 2024-04-04: qty 454, 30d supply, fill #0
  Filled 2024-05-01: qty 454, 30d supply, fill #1
  Filled 2024-05-29 – 2024-06-02 (×2): qty 454, 30d supply, fill #2
  Filled 2024-06-29 – 2024-08-01 (×4): qty 454, 30d supply, fill #3

## 2024-03-29 MED ORDER — DULOXETINE HCL 30 MG PO CPEP
30.0000 mg | ORAL_CAPSULE | Freq: Every day | ORAL | 11 refills | Status: DC
  Filled 2024-04-04: qty 30, 30d supply, fill #0

## 2024-03-29 MED ORDER — GABAPENTIN 600 MG PO TABS
600.0000 mg | ORAL_TABLET | Freq: Two times a day (BID) | ORAL | 11 refills | Status: AC | PRN
  Filled 2024-08-23: qty 60, 30d supply, fill #0
  Filled 2024-11-06: qty 60, 30d supply, fill #1

## 2024-03-29 MED ORDER — ALBUTEROL SULFATE HFA 108 (90 BASE) MCG/ACT IN AERS
2.0000 | INHALATION_SPRAY | Freq: Four times a day (QID) | RESPIRATORY_TRACT | 11 refills | Status: AC | PRN
  Filled 2024-10-11: qty 6.7, 25d supply, fill #0
  Filled 2024-10-11: qty 18, 25d supply, fill #0
  Filled 2024-11-21: qty 6.7, 25d supply, fill #1
  Filled 2024-12-20: qty 6.7, 25d supply, fill #2

## 2024-03-29 MED ORDER — BUDESONIDE-FORMOTEROL FUMARATE 160-4.5 MCG/ACT IN AERO
1.0000 | INHALATION_SPRAY | Freq: Two times a day (BID) | RESPIRATORY_TRACT | 11 refills | Status: AC
  Filled 2024-03-29: qty 10.2, 60d supply, fill #0
  Filled 2024-11-21 (×2): qty 10.2, 60d supply, fill #1
  Filled 2024-12-20: qty 10.2, 60d supply, fill #2

## 2024-03-29 MED ORDER — CYCLOBENZAPRINE HCL 10 MG PO TABS
10.0000 mg | ORAL_TABLET | Freq: Three times a day (TID) | ORAL | 11 refills | Status: AC
  Filled 2024-04-03: qty 90, 30d supply, fill #0
  Filled 2024-04-26: qty 90, 30d supply, fill #1
  Filled 2024-05-26: qty 90, 30d supply, fill #2
  Filled 2024-06-26: qty 90, 30d supply, fill #3
  Filled 2024-07-26: qty 90, 30d supply, fill #4
  Filled 2024-08-23: qty 90, 30d supply, fill #5
  Filled 2024-09-25: qty 90, 30d supply, fill #6
  Filled 2024-10-23: qty 90, 30d supply, fill #7
  Filled 2024-11-17: qty 90, 30d supply, fill #8
  Filled 2024-12-18: qty 63, 21d supply, fill #9

## 2024-03-29 MED ORDER — TRAZODONE HCL 50 MG PO TABS
50.0000 mg | ORAL_TABLET | Freq: Every day | ORAL | 5 refills | Status: DC
  Filled 2024-04-04: qty 30, 30d supply, fill #0

## 2024-03-29 MED ORDER — OXCARBAZEPINE 600 MG PO TABS
300.0000 mg | ORAL_TABLET | Freq: Two times a day (BID) | ORAL | 6 refills | Status: DC
  Filled 2024-04-03: qty 60, 60d supply, fill #0
  Filled 2024-05-26: qty 60, 60d supply, fill #1
  Filled 2024-07-25: qty 60, 60d supply, fill #2
  Filled 2024-09-25: qty 60, 60d supply, fill #3

## 2024-03-29 MED ORDER — BUDESONIDE-FORMOTEROL FUMARATE 160-4.5 MCG/ACT IN AERO
1.0000 | INHALATION_SPRAY | Freq: Two times a day (BID) | RESPIRATORY_TRACT | 11 refills | Status: AC
Start: 2023-11-02 — End: ?
  Filled 2024-05-22 – 2024-06-02 (×2): qty 10.2, 60d supply, fill #0
  Filled 2024-07-31: qty 10.2, 60d supply, fill #1
  Filled 2024-09-27 – 2024-10-10 (×2): qty 10.2, 60d supply, fill #2

## 2024-03-29 MED ORDER — XIIDRA 5 % OP SOLN: 1.0000 [drp] | Freq: Two times a day (BID) | OPHTHALMIC | 3 refills | Status: AC

## 2024-03-29 MED ORDER — ALBUTEROL SULFATE HFA 108 (90 BASE) MCG/ACT IN AERS: 2.0000 | INHALATION_SPRAY | Freq: Four times a day (QID) | RESPIRATORY_TRACT | 10 refills | Status: AC | PRN

## 2024-03-30 ENCOUNTER — Telehealth: Payer: Self-pay

## 2024-03-30 ENCOUNTER — Other Ambulatory Visit (HOSPITAL_COMMUNITY): Payer: Self-pay

## 2024-03-30 MED ORDER — PEG 3350-KCL-NA BICARB-NACL 420 G PO SOLR
4000.0000 mL | Freq: Once | ORAL | 0 refills | Status: AC
  Filled 2024-03-30: qty 4000, 2d supply, fill #0

## 2024-03-30 MED ORDER — BISACODYL 5 MG PO TBEC
5.0000 mg | DELAYED_RELEASE_TABLET | ORAL | 0 refills | Status: AC
  Filled 2024-03-30: qty 4, 1d supply, fill #0

## 2024-03-30 NOTE — Telephone Encounter (Signed)
 SURGICAL CLEARANCE FORM FAXED TO [facility] [phone]

## 2024-03-31 ENCOUNTER — Other Ambulatory Visit (HOSPITAL_COMMUNITY): Payer: Self-pay

## 2024-04-03 ENCOUNTER — Other Ambulatory Visit (HOSPITAL_COMMUNITY): Payer: Self-pay

## 2024-04-04 ENCOUNTER — Other Ambulatory Visit: Payer: Self-pay

## 2024-04-04 ENCOUNTER — Other Ambulatory Visit: Payer: Self-pay | Admitting: Neurology

## 2024-04-04 ENCOUNTER — Other Ambulatory Visit (HOSPITAL_BASED_OUTPATIENT_CLINIC_OR_DEPARTMENT_OTHER): Payer: Self-pay

## 2024-04-04 ENCOUNTER — Other Ambulatory Visit (HOSPITAL_COMMUNITY): Payer: Self-pay

## 2024-04-04 NOTE — Telephone Encounter (Signed)
 Requested Prescriptions   Pending Prescriptions Disp Refills   diazePAM , 20 MG Dose, (VALTOCO  20 MG DOSE) 2 x 10 MG/0.1ML LQPK 4 each 0    Sig: Place 20 mg into the nose as needed (For seizure lasting more than 2 minutes). **Use 1 spray per nostril**   Last seen 10/12/23 note stated: Patient Instructions  Increase oxcarbazepine  to 600 mg twice daily Start trazodone  50 mg nightly for insomnia, please contact me in 2 weeks if the trazodone  is not helpful Use duloxetine  30 mg daily for symptoms of anxiety/depression and pain Follow-up in 6 months or sooner if worse.  Next appt  05/15/24 Dispenses   Dispensed Days Supply Quantity Provider Pharmacy  diazePAM , 20 MG Dose, (VALTOCO  20 MG DOSE) 2 x 10 MG/0.1ML LQPK 03/20/2024 2 4 each Camara, Amadou, MD [facility]  VALTOCO  15MG  NASAL SPR (4X7.5MG  SP) 03/02/2024 30 2 each Cassandra Cleveland, MD [facility] DRUG STORE #...  VALTOCO  15MG  NASAL SPR (4X7.5MG  SP) 12/23/2023 15 6 each Camara, Amadou, MD [facility] DRUG STORE #...  VALTOCO  15MG  NASAL SPR 2X7.5MG  2PK 04/07/2023 30 4 each Camara, Amadou, MD [facility] #[facility]

## 2024-04-05 ENCOUNTER — Other Ambulatory Visit (HOSPITAL_COMMUNITY): Payer: Self-pay

## 2024-04-05 MED ORDER — ALPRAZOLAM 0.5 MG PO TABS
0.5000 mg | ORAL_TABLET | Freq: Three times a day (TID) | ORAL | 2 refills | Status: AC
  Filled 2024-04-05: qty 90, 30d supply, fill #0
  Filled 2024-05-08: qty 90, 30d supply, fill #1

## 2024-04-06 ENCOUNTER — Other Ambulatory Visit (HOSPITAL_COMMUNITY): Payer: Self-pay

## 2024-04-06 MED ORDER — VALTOCO 20 MG DOSE 2 X 10 MG/0.1ML NA LQPK: 20.0000 mg | NASAL | 0 refills | Status: DC | PRN

## 2024-04-11 ENCOUNTER — Other Ambulatory Visit (HOSPITAL_COMMUNITY): Payer: Self-pay

## 2024-04-11 MED ORDER — OXYCODONE HCL 10 MG PO TABS
10.0000 mg | ORAL_TABLET | Freq: Every day | ORAL | 0 refills | Status: DC
  Filled 2024-04-11: qty 28, 28d supply, fill #0

## 2024-05-01 ENCOUNTER — Other Ambulatory Visit (HOSPITAL_COMMUNITY): Payer: Self-pay

## 2024-05-01 MED ORDER — OXYCODONE HCL 10 MG PO TABS
10.0000 mg | ORAL_TABLET | Freq: Four times a day (QID) | ORAL | 0 refills | Status: DC
  Filled 2024-05-01: qty 28, 7d supply, fill #0

## 2024-05-08 ENCOUNTER — Other Ambulatory Visit (HOSPITAL_COMMUNITY): Payer: Self-pay

## 2024-05-15 ENCOUNTER — Ambulatory Visit: Payer: Medicaid Other | Admitting: Neurology

## 2024-05-15 ENCOUNTER — Telehealth: Payer: Self-pay | Admitting: Neurology

## 2024-05-15 NOTE — Telephone Encounter (Signed)
 Pt did not remember today's appointment, he has been r/s and is still on wait list

## 2024-05-22 ENCOUNTER — Other Ambulatory Visit (HOSPITAL_COMMUNITY): Payer: Self-pay

## 2024-06-01 ENCOUNTER — Other Ambulatory Visit (HOSPITAL_COMMUNITY): Payer: Self-pay

## 2024-06-02 ENCOUNTER — Other Ambulatory Visit (HOSPITAL_COMMUNITY): Payer: Self-pay

## 2024-06-07 ENCOUNTER — Other Ambulatory Visit (HOSPITAL_COMMUNITY): Payer: Self-pay

## 2024-06-07 MED ORDER — ALPRAZOLAM 0.5 MG PO TABS
0.5000 mg | ORAL_TABLET | Freq: Three times a day (TID) | ORAL | 2 refills | Status: DC | PRN
  Filled 2024-06-07: qty 90, 30d supply, fill #0
  Filled 2024-07-07: qty 90, 30d supply, fill #1
  Filled 2024-08-08: qty 90, 30d supply, fill #2

## 2024-06-07 MED ORDER — OXYCODONE HCL 10 MG PO TABS
10.0000 mg | ORAL_TABLET | Freq: Two times a day (BID) | ORAL | 0 refills | Status: DC
  Filled 2024-06-07: qty 56, 28d supply, fill #0

## 2024-06-19 ENCOUNTER — Other Ambulatory Visit (HOSPITAL_COMMUNITY): Payer: Self-pay

## 2024-06-26 ENCOUNTER — Other Ambulatory Visit: Payer: Self-pay

## 2024-06-26 ENCOUNTER — Other Ambulatory Visit (HOSPITAL_COMMUNITY): Payer: Self-pay

## 2024-06-26 MED ORDER — OMEPRAZOLE 40 MG PO CPDR
40.0000 mg | DELAYED_RELEASE_CAPSULE | Freq: Every morning | ORAL | 2 refills | Status: DC
  Filled 2024-06-26: qty 30, 30d supply, fill #0
  Filled 2024-07-26: qty 30, 30d supply, fill #1
  Filled 2024-08-25: qty 30, 30d supply, fill #2

## 2024-07-03 ENCOUNTER — Other Ambulatory Visit (HOSPITAL_COMMUNITY): Payer: Self-pay

## 2024-07-03 MED ORDER — TAMSULOSIN HCL 0.4 MG PO CAPS
0.4000 mg | ORAL_CAPSULE | Freq: Every day | ORAL | 2 refills | Status: AC
  Filled 2024-07-03: qty 90, 90d supply, fill #0
  Filled 2024-09-25: qty 90, 90d supply, fill #1
  Filled 2024-12-25: qty 90, 90d supply, fill #2

## 2024-07-03 MED ORDER — OXYCODONE HCL 10 MG PO TABS
10.0000 mg | ORAL_TABLET | Freq: Two times a day (BID) | ORAL | 0 refills | Status: DC
  Filled 2024-07-03: qty 56, 28d supply, fill #0

## 2024-07-07 ENCOUNTER — Other Ambulatory Visit (HOSPITAL_COMMUNITY): Payer: Self-pay

## 2024-07-11 ENCOUNTER — Emergency Department (HOSPITAL_COMMUNITY)
Admission: EM | Admit: 2024-07-11 | Discharge: 2024-07-11 | Disposition: A | Discharge status: Home / Self Care | Attending: Emergency Medicine | Admitting: Emergency Medicine

## 2024-07-11 ENCOUNTER — Emergency Department (HOSPITAL_COMMUNITY)

## 2024-07-11 ENCOUNTER — Other Ambulatory Visit (HOSPITAL_COMMUNITY): Payer: Self-pay

## 2024-07-11 ENCOUNTER — Encounter (HOSPITAL_COMMUNITY): Payer: Self-pay

## 2024-07-11 DIAGNOSIS — D72829 Elevated white blood cell count, unspecified: Secondary | ICD-10-CM | POA: Diagnosis not present

## 2024-07-11 DIAGNOSIS — R10819 Abdominal tenderness, unspecified site: Secondary | ICD-10-CM | POA: Insufficient documentation

## 2024-07-11 DIAGNOSIS — R197 Diarrhea, unspecified: Secondary | ICD-10-CM | POA: Insufficient documentation

## 2024-07-11 DIAGNOSIS — R112 Nausea with vomiting, unspecified: Secondary | ICD-10-CM | POA: Insufficient documentation

## 2024-07-11 DIAGNOSIS — J4489 Other specified chronic obstructive pulmonary disease: Secondary | ICD-10-CM | POA: Insufficient documentation

## 2024-07-11 DIAGNOSIS — E876 Hypokalemia: Secondary | ICD-10-CM | POA: Insufficient documentation

## 2024-07-11 LAB — CBC WITH DIFFERENTIAL/PLATELET
Abs Immature Granulocytes: 0.04 K/uL (ref 0.00–0.07)
Basophils Absolute: 0 K/uL (ref 0.0–0.1)
Basophils Relative: 0 %
Eosinophils Absolute: 0 K/uL (ref 0.0–0.5)
Eosinophils Relative: 0 %
HCT: 49.5 % (ref 39.0–52.0)
Hemoglobin: 16.8 g/dL (ref 13.0–17.0)
Immature Granulocytes: 0 %
Lymphocytes Relative: 26 %
Lymphs Abs: 3.1 K/uL (ref 0.7–4.0)
MCH: 30.5 pg (ref 26.0–34.0)
MCHC: 33.9 g/dL (ref 30.0–36.0)
MCV: 89.8 fL (ref 80.0–100.0)
Monocytes Absolute: 0.9 K/uL (ref 0.1–1.0)
Monocytes Relative: 8 %
Neutro Abs: 7.7 K/uL (ref 1.7–7.7)
Neutrophils Relative %: 66 %
Platelets: 376 K/uL (ref 150–400)
RBC: 5.51 MIL/uL (ref 4.22–5.81)
RDW: 12.3 % (ref 11.5–15.5)
WBC: 11.9 K/uL — ABNORMAL HIGH (ref 4.0–10.5)
nRBC: 0 % (ref 0.0–0.2)

## 2024-07-11 LAB — COMPREHENSIVE METABOLIC PANEL WITH GFR
ALT: 16 U/L (ref 0–44)
AST: 15 U/L (ref 15–41)
Albumin: 4.4 g/dL (ref 3.5–5.0)
Alkaline Phosphatase: 64 U/L (ref 38–126)
Anion gap: 12 (ref 5–15)
BUN: 14 mg/dL (ref 6–20)
CO2: 19 mmol/L — ABNORMAL LOW (ref 22–32)
Calcium: 9.6 mg/dL (ref 8.9–10.3)
Chloride: 104 mmol/L (ref 98–111)
Creatinine, Ser: 1.02 mg/dL (ref 0.61–1.24)
GFR, Estimated: >60 mL/min (ref >60)
Glucose, Bld: 204 mg/dL — ABNORMAL HIGH (ref 70–99)
Potassium: 3.3 mmol/L — ABNORMAL LOW (ref 3.5–5.1)
Sodium: 135 mmol/L (ref 135–145)
Total Bilirubin: 1 mg/dL (ref 0.0–1.2)
Total Protein: 8 g/dL (ref 6.5–8.1)

## 2024-07-11 LAB — URINALYSIS, ROUTINE W REFLEX MICROSCOPIC
Bilirubin Urine: NEGATIVE
Glucose, UA: NEGATIVE mg/dL
Hgb urine dipstick: NEGATIVE
Ketones, ur: 20 mg/dL — AB
Leukocytes,Ua: NEGATIVE
Nitrite: NEGATIVE
Protein, ur: NEGATIVE mg/dL
Specific Gravity, Urine: 1.015 (ref 1.005–1.030)
pH: 7 (ref 5.0–8.0)

## 2024-07-11 LAB — TROPONIN I (HIGH SENSITIVITY)
Troponin I (High Sensitivity): <2 ng/L (ref <18)
Troponin I (High Sensitivity): 3 ng/L (ref <18)

## 2024-07-11 LAB — LIPASE, BLOOD: Lipase: 26 U/L (ref 11–51)

## 2024-07-11 MED ORDER — IOHEXOL 300 MG/ML  SOLN
100.0000 mL | Freq: Once | INTRAMUSCULAR | Status: AC | PRN
  Administered 2024-07-11: 100 mL via INTRAVENOUS

## 2024-07-11 MED ORDER — ONDANSETRON HCL 4 MG/2ML IJ SOLN
4.0000 mg | Freq: Once | INTRAMUSCULAR | Status: AC
  Administered 2024-07-11: 4 mg via INTRAVENOUS
  Filled 2024-07-11: qty 2

## 2024-07-11 MED ORDER — PREDNISONE 20 MG PO TABS: 40.0000 mg | ORAL_TABLET | Freq: Once | ORAL | Status: DC

## 2024-07-11 MED ORDER — MORPHINE SULFATE (PF) 4 MG/ML IV SOLN
4.0000 mg | Freq: Once | INTRAVENOUS | Status: AC
  Administered 2024-07-11: 4 mg via INTRAVENOUS
  Filled 2024-07-11: qty 1

## 2024-07-11 MED ORDER — METOCLOPRAMIDE HCL 5 MG/ML IJ SOLN
10.0000 mg | Freq: Once | INTRAMUSCULAR | Status: AC
  Administered 2024-07-11: 10 mg via INTRAVENOUS
  Filled 2024-07-11: qty 2

## 2024-07-11 MED ORDER — SODIUM CHLORIDE 0.9 % IV BOLUS
1000.0000 mL | Freq: Once | INTRAVENOUS | Status: AC
  Administered 2024-07-11: 1000 mL via INTRAVENOUS

## 2024-07-11 MED ORDER — ONDANSETRON 4 MG PO TBDP: 4.0000 mg | ORAL_TABLET | Freq: Three times a day (TID) | ORAL | 0 refills | Status: AC | PRN

## 2024-07-11 MED ORDER — POTASSIUM CHLORIDE CRYS ER 20 MEQ PO TBCR
40.0000 meq | EXTENDED_RELEASE_TABLET | Freq: Once | ORAL | Status: AC
  Administered 2024-07-11: 40 meq via ORAL
  Filled 2024-07-11: qty 2

## 2024-07-11 MED ORDER — METOCLOPRAMIDE HCL 10 MG PO TABS: 10.0000 mg | ORAL_TABLET | Freq: Four times a day (QID) | ORAL | 0 refills | Status: DC

## 2024-07-11 MED ORDER — PANTOPRAZOLE SODIUM 40 MG IV SOLR
40.0000 mg | Freq: Once | INTRAVENOUS | Status: AC
  Administered 2024-07-11: 40 mg via INTRAVENOUS
  Filled 2024-07-11: qty 10

## 2024-07-11 MED ORDER — PANTOPRAZOLE SODIUM 20 MG PO TBEC: 20.0000 mg | DELAYED_RELEASE_TABLET | Freq: Every day | ORAL | 0 refills | Status: AC

## 2024-07-11 NOTE — ED Notes (Signed)
 Pt. Tolerated PO gingerale and saltine crackers well.

## 2024-07-11 NOTE — ED Triage Notes (Signed)
 Pt arrived via EMS, from home. N/v/d x2 days. Very anxious due to not being able to keep down normal meds. Concerned he might have seizure.

## 2024-07-11 NOTE — Discharge Instructions (Signed)
 Today you were seen for nausea, vomiting, and diarrhea.  Your workup in the ED was reassuring.  Please pick up your medications and take as prescribed.  Please return to the ED if you are still having uncontrollable vomiting, or fever that does not go down with Tylenol  or Motrin . Thank you for letting us  treat you today. After reviewing your labs and imaging, I feel you are safe to go home. Please follow up with your PCP in the next several days and provide them with your records from this visit. Return to the Emergency Room if pain becomes severe or symptoms worsen.

## 2024-07-11 NOTE — ED Provider Notes (Signed)
 [place] EMERGENCY DEPARTMENT AT [facility] Provider Note   CSN: [id number] Arrival date & time: 07/11/24  1329     Patient presents with: Emesis   Wayne Hawkins is a 53 y.o. male.   Patient with history of COPD and seizures presents today with complaints of nausea, vomiting, and diarrhea.  He reports that same has been ongoing for the last 2 days.  Reports he is concerned that he has not been able to to tolerate his seizure medicines due to his symptoms and wants to ensure that he does not have a seizure.  He does endorse abdominal pain as well.  Denies fevers or chills.  No history of similar symptoms previously.  The history is provided by the patient. No language interpreter was used.  Emesis Associated symptoms: abdominal pain and diarrhea        Prior to Admission medications   Medication Sig Start Date End Date Taking? Authorizing Provider  albuterol  (PROVENTIL  HFA;VENTOLIN  HFA) 108 (90 Base) MCG/ACT inhaler Inhale 2 puffs into the lungs every 6 (six) hours as needed for wheezing or shortness of breath.  12/25/18   [provider]  albuterol  (VENTOLIN  HFA) 108 (90 Base) MCG/ACT inhaler Inhale 2 puffs into the lungs up to 4 (four) times daily as needed. 03/08/24     albuterol  (VENTOLIN  HFA) 108 (90 Base) MCG/ACT inhaler Inhale 2 puffs into the lungs 4 (four) times daily as needed. 11/02/23     ALPRAZolam  (XANAX ) 0.5 MG tablet Take 1 tablet (0.5 mg total) by mouth 3 (three) times daily as needed. 03/08/24     ALPRAZolam  (XANAX ) 0.5 MG tablet Take 1 tablet (0.5 mg total) by mouth 3 (three) times daily as needed. 06/07/24     bisacodyl  (DULCOLAX) 5 MG EC tablet Take 1 tablet (5 mg total) by mouth as directed. 03/28/24     budesonide -formoterol  (SYMBICORT ) 160-4.5 MCG/ACT inhaler Inhale 2 puffs into the lungs 2 (two) times daily.    [provider]  budesonide -formoterol  (SYMBICORT ) 160-4.5 MCG/ACT inhaler Inhale 1 puff into the lungs 2 (two) times daily.  03/08/24     budesonide -formoterol  (SYMBICORT ) 160-4.5 MCG/ACT inhaler Inhale 1 puff into the lungs 2 (two) times daily. 11/02/23     cyclobenzaprine  (FLEXERIL ) 10 MG tablet Take 10 mg by mouth 3 (three) times daily as needed for muscle spasms.    [provider]  cyclobenzaprine  (FLEXERIL ) 10 MG tablet Take 1 tablet (10 mg total) by mouth 3 (three) times daily. 03/08/24     diazePAM , 20 MG Dose, (VALTOCO  20 MG DOSE) 2 x 10 MG/0.1ML LQPK Place 20 mg (1 spray) into the nose as needed (For seizure lasting more than 2 minutes). **Use 1 spray per nostril** 04/06/24   Gregg Lek, MD  diphenhydrAMINE (BENADRYL) 25 MG tablet Take 50 mg by mouth at bedtime as needed for allergies.    [provider]  gabapentin  (NEURONTIN ) 600 MG tablet Take 1 tablet (600 mg total) by mouth 3 (three) times daily. 02/14/23   Gawaluck, Greylon, MD  gabapentin  (NEURONTIN ) 600 MG tablet Take 1 tablet (600 mg total) by mouth 2 (two) times daily as needed for leg pain. 03/08/24     Lifitegrast  (XIIDRA ) 5 % SOLN Place 1 drop into both eyes 2 (two) times daily. 09/06/23     omeprazole  (PRILOSEC) 40 MG capsule Take 1 capsule (40 mg total) by mouth every morning 30 minutes to 1 hour before morning meal. 06/26/24     oxcarbazepine  (TRILEPTAL ) 600 MG  tablet Take 0.5 tablets (300 mg total) by mouth 2 (two) times daily. 10/12/23 12/05/24  Camara, Amadou, MD  oxcarbazepine  (TRILEPTAL ) 600 MG tablet Take 1/2 tablet (300 mg total) by mouth 2 (two) times daily. 10/12/23   Camara, Amadou, MD  Oxycodone  HCl 10 MG TABS Take 1 tablet (10 mg total) by mouth 2 (two) times daily. 07/03/24     polyethylene glycol-electrolytes (NULYTELY) 420 g solution Take as directed 03/28/24 04/01/24    tamsulosin  (FLOMAX ) 0.4 MG CAPS capsule Take 1 capsule (0.4 mg total) by mouth daily. 07/03/24   Henry Ingle, MD  triamcinolone  cream (KENALOG ) 0.1 % Apply topically to rash daily. 03/08/24     DULoxetine  (CYMBALTA ) 30 MG capsule Take 1 capsule (30 mg  total) by mouth daily. 10/12/23 04/06/24  Camara, Amadou, MD  traZODone  (DESYREL ) 50 MG tablet Take 1 tablet (50 mg total) by mouth at bedtime. 10/12/23 04/06/24  Camara, Amadou, MD    Allergies: Patient has no known allergies.    Review of Systems  Gastrointestinal:  Positive for abdominal pain, diarrhea, nausea and vomiting.  All other systems reviewed and are negative.   Updated Vital Signs BP (!) 149/98   Pulse 83   Temp 98.6 F (37 C) (Oral)   Resp 16   SpO2 100%   Physical Exam Vitals and nursing note reviewed.  Constitutional:      General: He is not in acute distress.    Appearance: Normal appearance. He is normal weight. He is not ill-appearing, toxic-appearing or diaphoretic.  HENT:     Head: Normocephalic and atraumatic.  Cardiovascular:     Rate and Rhythm: Normal rate.  Pulmonary:     Effort: Pulmonary effort is normal. No respiratory distress.  Abdominal:     General: Abdomen is flat.     Palpations: Abdomen is soft.     Tenderness: There is abdominal tenderness. There is no guarding or rebound.  Musculoskeletal:        General: Normal range of motion.     Cervical back: Normal range of motion.  Skin:    General: Skin is warm and dry.  Neurological:     General: No focal deficit present.     Mental Status: He is alert.  Psychiatric:        Mood and Affect: Mood normal.        Behavior: Behavior normal.     (all labs ordered are listed, but only abnormal results are displayed) Labs Reviewed  COMPREHENSIVE METABOLIC PANEL WITH GFR - Abnormal; Notable for the following components:      Result Value   Potassium 3.3 (*)    CO2 19 (*)    Glucose, Bld 204 (*)    All other components within normal limits  CBC WITH DIFFERENTIAL/PLATELET - Abnormal; Notable for the following components:   WBC 11.9 (*)    All other components within normal limits  URINALYSIS, ROUTINE W REFLEX MICROSCOPIC - Abnormal; Notable for the following components:   Ketones, ur 20  (*)    All other components within normal limits  LIPASE, BLOOD  TROPONIN I (HIGH SENSITIVITY)  TROPONIN I (HIGH SENSITIVITY)    EKG: None  Radiology: CT ABDOMEN PELVIS W CONTRAST Result Date: 07/11/2024 CLINICAL DATA:  Acute abdominal pain EXAM: CT ABDOMEN AND PELVIS WITH CONTRAST TECHNIQUE: Multidetector CT imaging of the abdomen and pelvis was performed using the standard protocol following bolus administration of intravenous contrast. RADIATION DOSE REDUCTION: This exam was performed according to the departmental  dose-optimization program which includes automated exposure control, adjustment of the mA and/or kV according to patient size and/or use of iterative reconstruction technique. CONTRAST:  OMNIPAQUE  IOHEXOL  300 MG/ML  SOLN COMPARISON:  CT abdomen and pelvis 11/11/2022 FINDINGS: Lower chest: 2-3 mm nodular densities in the left lower lobe appear unchanged from 2023. Hepatobiliary: No focal liver abnormality is seen. No gallstones, gallbladder wall thickening, or biliary dilatation. Pancreas: Unremarkable. No pancreatic ductal dilatation or surrounding inflammatory changes. Spleen: Normal in size without focal abnormality. Adrenals/Urinary Tract: Adrenal glands are unremarkable. Kidneys are normal, without renal calculi, focal lesion, or hydronephrosis. Bladder is unremarkable. Stomach/Bowel: Stomach is within normal limits. Appendix appears normal. No evidence of bowel wall thickening, distention, or inflammatory changes. Vascular/Lymphatic: No significant vascular findings are present. No enlarged abdominal or pelvic lymph nodes. Reproductive: Prostate is unremarkable. Other: There is a small fat containing umbilical hernia. There is no ascites. Musculoskeletal: No acute or significant osseous findings. IMPRESSION: 1. No acute localizing process in the abdomen or pelvis. 2. Small fat containing umbilical hernia. Electronically Signed   By: Greig Pique M.D.   On: 07/11/2024 19:35      Procedures   Medications Ordered in the ED  iohexol  (OMNIPAQUE ) 300 MG/ML solution 100 mL (has no administration in time range)  ondansetron  (ZOFRAN ) injection 4 mg (4 mg Intravenous Given 07/11/24 1552)  morphine  (PF) 4 MG/ML injection 4 mg (4 mg Intravenous Given 07/11/24 1553)  sodium chloride  0.9 % bolus 1,000 mL (0 mLs Intravenous Stopped 07/11/24 1909)  ondansetron  (ZOFRAN ) injection 4 mg (4 mg Intravenous Given 07/11/24 1908)  morphine  (PF) 4 MG/ML injection 4 mg (4 mg Intravenous Given 07/11/24 1908)                                    Medical Decision Making Amount and/or Complexity of Data Reviewed Labs: ordered. Radiology: ordered.  Risk Prescription drug management.   This patient is a 53 y.o. male who presents to the ED for concern of nausea, vomiting, diarrhea, abdominal pain, this involves an extensive number of treatment options, and is a complaint that carries with it a high risk of complications and morbidity. The emergent differential diagnosis prior to evaluation includes, but is not limited to,  The differential diagnosis for generalized abdominal pain includes, but is not limited to AAA, gastroenteritis, appendicitis, Bowel obstruction, Bowel perforation. Gastroparesis, DKA, Hernia, Inflammatory bowel disease, mesenteric ischemia, pancreatitis, peritonitis SBP, volvulus.   This is not an exhaustive differential.   Past Medical History / Co-morbidities / Social History:  has a past medical history of Asthma, COPD (chronic obstructive pulmonary disease) (HCC), Seizures (HCC), and TBI (traumatic brain injury) (HCC).  Additional history: Chart reviewed.  Physical Exam: Physical exam performed. The pertinent findings include: generalized abdominal TTP without rebound or guarding  Lab Tests: I ordered, and personally interpreted labs.  The pertinent results include:  WBC 11.9, K 3.3, bicarb 19, glucose 204, UA with ketones, noninfectious   Imaging Studies: I  ordered imaging studies including Ct abdomen pelvis. I independently visualized and interpreted imaging which showed  1. No acute localizing process in the abdomen or pelvis. 2. Small fat containing umbilical hernia.  I agree with the radiologist interpretation.  Medications: I ordered medication including IV fluids, oral potassium, protonix , zofran , morphine , reglan   for hypokalemia, dehydration, nausea, vomiting, pain.SABRA Reevaluation of the patient after these medicines showed that the patient improved. I have  reviewed the patients home medicines and have made adjustments as needed.   Disposition: After consideration of the diagnostic results and the patients response to treatment, I feel that emergency department workup does not suggest an emergent condition requiring admission or immediate intervention beyond what has been performed at this time. The plan is: DC with antiemetics, close outpatient follow-up and return precautions.  After above interventions, patient is feeling significantly improved and ready to go home.  He is able to eat and drink without any residual nausea or vomiting.  He would like to go home and take his home medications which is reasonable.  His workup is otherwise benign.  Suspect gastroenteritis, likely viral.  Discussed that with patient is understanding.  Will also send for PPI given he has had some reflux symptoms likely due to significant amount of vomiting.  He felt better after giving the Protonix .  Evaluation and diagnostic testing in the emergency department does not suggest an emergent condition requiring admission or immediate intervention beyond what has been performed at this time.  Plan for discharge with close PCP follow-up.  Patient is understanding and amenable with plan, educated on red flag symptoms that would prompt immediate return.  Patient discharged in stable condition.  Final diagnoses:  Nausea, vomiting, and diarrhea    ED Discharge Orders           Ordered    metoCLOPramide  (REGLAN ) 10 MG tablet  Every 6 hours        07/11/24 2212    ondansetron  (ZOFRAN -ODT) 4 MG disintegrating tablet  Every 8 hours PRN        07/11/24 2212    pantoprazole  (PROTONIX ) 20 MG tablet  Daily        07/11/24 2212          An After Visit Summary was printed and given to the patient.      Kimiyah Blick A, PA-C 07/11/24 2248    Patt Alm Macho, MD 07/12/24 [phone]

## 2024-07-20 ENCOUNTER — Other Ambulatory Visit (HOSPITAL_COMMUNITY): Payer: Self-pay

## 2024-07-20 MED ORDER — CLINDAMYCIN HCL 300 MG PO CAPS
300.0000 mg | ORAL_CAPSULE | Freq: Two times a day (BID) | ORAL | 0 refills | Status: DC
  Filled 2024-07-20: qty 14, 7d supply, fill #0

## 2024-07-31 ENCOUNTER — Other Ambulatory Visit (HOSPITAL_COMMUNITY): Payer: Self-pay

## 2024-07-31 MED ORDER — OXYCODONE HCL 10 MG PO TABS
10.0000 mg | ORAL_TABLET | Freq: Two times a day (BID) | ORAL | 0 refills | Status: DC
  Filled 2024-07-31: qty 56, 28d supply, fill #0

## 2024-08-04 ENCOUNTER — Other Ambulatory Visit (HOSPITAL_COMMUNITY): Payer: Self-pay

## 2024-08-08 ENCOUNTER — Other Ambulatory Visit (HOSPITAL_BASED_OUTPATIENT_CLINIC_OR_DEPARTMENT_OTHER): Payer: Self-pay

## 2024-08-08 ENCOUNTER — Other Ambulatory Visit (HOSPITAL_COMMUNITY): Payer: Self-pay

## 2024-08-16 ENCOUNTER — Other Ambulatory Visit (HOSPITAL_COMMUNITY): Payer: Self-pay

## 2024-08-16 MED ORDER — CLINDAMYCIN HCL 300 MG PO CAPS
300.0000 mg | ORAL_CAPSULE | Freq: Two times a day (BID) | ORAL | 0 refills | Status: AC
  Filled 2024-08-16: qty 14, 7d supply, fill #0

## 2024-08-16 MED ORDER — HYDROCODONE-ACETAMINOPHEN 10-325 MG PO TABS
1.0000 | ORAL_TABLET | Freq: Four times a day (QID) | ORAL | 0 refills | Status: AC | PRN
  Filled 2024-08-16: qty 18, 5d supply, fill #0

## 2024-08-23 ENCOUNTER — Other Ambulatory Visit (HOSPITAL_COMMUNITY): Payer: Self-pay

## 2024-08-28 ENCOUNTER — Other Ambulatory Visit (HOSPITAL_COMMUNITY): Payer: Self-pay

## 2024-08-28 MED ORDER — ALPRAZOLAM 0.5 MG PO TABS
0.5000 mg | ORAL_TABLET | Freq: Two times a day (BID) | ORAL | 0 refills | Status: DC | PRN
  Filled 2024-09-05: qty 60, 30d supply, fill #0

## 2024-08-28 MED ORDER — OXYCODONE HCL 10 MG PO TABS
10.0000 mg | ORAL_TABLET | Freq: Two times a day (BID) | ORAL | 0 refills | Status: DC
  Filled 2024-08-28: qty 56, 28d supply, fill #0

## 2024-09-05 ENCOUNTER — Other Ambulatory Visit (HOSPITAL_COMMUNITY): Payer: Self-pay

## 2024-09-09 ENCOUNTER — Encounter (HOSPITAL_COMMUNITY): Payer: Self-pay | Admitting: Emergency Medicine

## 2024-09-09 ENCOUNTER — Encounter (HOSPITAL_COMMUNITY): Payer: Self-pay

## 2024-09-09 ENCOUNTER — Other Ambulatory Visit: Payer: Self-pay

## 2024-09-09 ENCOUNTER — Ambulatory Visit (HOSPITAL_COMMUNITY)
Admission: EM | Admit: 2024-09-09 | Discharge: 2024-09-09 | Disposition: A | Discharge status: Home / Self Care | Attending: Internal Medicine | Admitting: Internal Medicine

## 2024-09-09 ENCOUNTER — Emergency Department (HOSPITAL_COMMUNITY)
Admission: EM | Admit: 2024-09-09 | Discharge: 2024-09-09 | Disposition: A | Discharge status: Home / Self Care | Payer: MEDICAID | Attending: Emergency Medicine | Admitting: Emergency Medicine

## 2024-09-09 DIAGNOSIS — S0991XA Unspecified injury of ear, initial encounter: Secondary | ICD-10-CM | POA: Insufficient documentation

## 2024-09-09 DIAGNOSIS — H6121 Impacted cerumen, right ear: Secondary | ICD-10-CM | POA: Diagnosis not present

## 2024-09-09 DIAGNOSIS — X58XXXA Exposure to other specified factors, initial encounter: Secondary | ICD-10-CM | POA: Diagnosis not present

## 2024-09-09 DIAGNOSIS — J449 Chronic obstructive pulmonary disease, unspecified: Secondary | ICD-10-CM | POA: Diagnosis not present

## 2024-09-09 DIAGNOSIS — H9201 Otalgia, right ear: Secondary | ICD-10-CM | POA: Diagnosis not present

## 2024-09-09 DIAGNOSIS — J45909 Unspecified asthma, uncomplicated: Secondary | ICD-10-CM | POA: Insufficient documentation

## 2024-09-09 MED ORDER — OXYCODONE-ACETAMINOPHEN 5-325 MG PO TABS: 1.0000 | ORAL_TABLET | Freq: Four times a day (QID) | ORAL | 0 refills | Status: AC | PRN

## 2024-09-09 MED ORDER — CIPROFLOXACIN-DEXAMETHASONE 0.3-0.1 % OT SUSP: 4.0000 [drp] | Freq: Two times a day (BID) | OTIC | 0 refills | Status: AC

## 2024-09-09 MED ORDER — OXYCODONE-ACETAMINOPHEN 5-325 MG PO TABS
1.0000 | ORAL_TABLET | Freq: Once | ORAL | Status: AC
  Administered 2024-09-09: 1 via ORAL
  Filled 2024-09-09: qty 1

## 2024-09-09 NOTE — ED Triage Notes (Signed)
 Pt was just d/c, states that he thinks his ear drum was punctured when it was cleaned out earlier.

## 2024-09-09 NOTE — Discharge Instructions (Addendum)
 Impacted cerumen (earwax) of the right ear.  Ear irrigation done today.

## 2024-09-09 NOTE — ED Provider Triage Note (Signed)
 Emergency Medicine Provider Triage Evaluation Note  Wayne Hawkins , a 53 y.o. male  was evaluated in triage.  Pt complains of concern for right eardrum perforation.  He was seen in urgent care prior to arrival, reports that he was concerned about cerumen impaction.  He reports that they performed an inappropriate exam of the ear which led to stabbing of his eardrum.  He reports complete loss of hearing on that side.  Very concerned due to history of seizures, traumatic brain injury about infection.  Review of Systems  Positive: Hearing loss, ear pain Negative:   Physical Exam  BP 110/80 (BP Location: Right Arm)   Pulse 95   Temp 98.4 F (36.9 C)   Resp 16   SpO2 96%  Gen:   Awake, no distress   Resp:  Normal effort  MSK:   Moves extremities without difficulty  Other:  Somewhat difficult otoscopic exam, there is a large amount of cerumen, I am unsure based on the exam but there is concern for perforated tympanic membrane based on my evaluation.  Medical Decision Making  Medically screening exam initiated at 5:46 PM.  Appropriate orders placed.  Wayne Hawkins was informed that the remainder of the evaluation will be completed by another provider, this initial triage assessment does not replace that evaluation, and the importance of remaining in the ED until their evaluation is complete.  Workup initiated in triage    Rosan Sherlean DEL, [place] 09/09/24 1746

## 2024-09-09 NOTE — ED Provider Notes (Signed)
 [place] EMERGENCY DEPARTMENT AT [facility] Provider Note   CSN: [id number] Arrival date & time: 09/09/24  1649     Patient presents with: Ear Injury   Wayne Hawkins is a 53 y.o. male Pt complains of concern for right eardrum perforation.  He was seen in urgent care prior to arrival, reports that he was concerned about cerumen impaction.  He reports that they performed an inappropriate exam of the ear which led to stabbing of his eardrum.  He reports complete loss of hearing on that side.  Very concerned due to history of seizures, traumatic brain injury, about infection.     HPI     Prior to Admission medications   Medication Sig Start Date End Date Taking? Authorizing Provider  ciprofloxacin-dexamethasone  (CIPRODEX) OTIC suspension Place 4 drops into the right ear 2 (two) times daily. 09/09/24  Yes Yanett Conkright H, PA-C  oxyCODONE -acetaminophen  (PERCOCET/ROXICET) 5-325 MG tablet Take 1 tablet by mouth every 6 (six) hours as needed for severe pain (pain score 7-10). 09/09/24  Yes Jayliani Wanner H, PA-C  albuterol  (PROVENTIL  HFA;VENTOLIN  HFA) 108 (90 Base) MCG/ACT inhaler Inhale 2 puffs into the lungs every 6 (six) hours as needed for wheezing or shortness of breath.  12/25/18   [provider]  albuterol  (VENTOLIN  HFA) 108 (90 Base) MCG/ACT inhaler Inhale 2 puffs into the lungs up to 4 (four) times daily as needed. 03/08/24     albuterol  (VENTOLIN  HFA) 108 (90 Base) MCG/ACT inhaler Inhale 2 puffs into the lungs 4 (four) times daily as needed. 11/02/23     ALPRAZolam  (XANAX ) 0.5 MG tablet Take 1 tablet (0.5 mg total) by mouth 3 (three) times daily as needed. 03/08/24     ALPRAZolam  (XANAX ) 0.5 MG tablet Take 1 tablet (0.5 mg total) by mouth 2 (two) times daily as needed. 08/28/24     bisacodyl  (DULCOLAX) 5 MG EC tablet Take 1 tablet (5 mg total) by mouth as directed. 03/28/24     budesonide -formoterol  (SYMBICORT ) 160-4.5 MCG/ACT inhaler Inhale 2 puffs into the  lungs 2 (two) times daily.    [provider]  budesonide -formoterol  (SYMBICORT ) 160-4.5 MCG/ACT inhaler Inhale 1 puff into the lungs 2 (two) times daily. 03/08/24     budesonide -formoterol  (SYMBICORT ) 160-4.5 MCG/ACT inhaler Inhale 1 puff into the lungs 2 (two) times daily. 11/02/23     clindamycin  (CLEOCIN ) 300 MG capsule Take 1 capsule (300 mg total) by mouth every 12 (twelve) hours. 08/16/24     cyclobenzaprine  (FLEXERIL ) 10 MG tablet Take 10 mg by mouth 3 (three) times daily as needed for muscle spasms.    [provider]  cyclobenzaprine  (FLEXERIL ) 10 MG tablet Take 1 tablet (10 mg total) by mouth 3 (three) times daily. 03/08/24     diazePAM , 20 MG Dose, (VALTOCO  20 MG DOSE) 2 x 10 MG/0.1ML LQPK Place 20 mg (1 spray) into the nose as needed (For seizure lasting more than 2 minutes). **Use 1 spray per nostril** 04/06/24   Gregg Lek, MD  diphenhydrAMINE (BENADRYL) 25 MG tablet Take 50 mg by mouth at bedtime as needed for allergies.    [provider]  gabapentin  (NEURONTIN ) 600 MG tablet Take 1 tablet (600 mg total) by mouth 3 (three) times daily. 02/14/23   Gawaluck, Greylon, MD  gabapentin  (NEURONTIN ) 600 MG tablet Take 1 tablet (600 mg total) by mouth 2 (two) times daily as needed for leg pain. 03/08/24     HYDROcodone -acetaminophen  (NORCO) 10-325 MG tablet Take 1 tablet by mouth  every 6 (six) hours as needed for dental pain. 08/16/24     Lifitegrast  (XIIDRA ) 5 % SOLN Place 1 drop into both eyes 2 (two) times daily. 09/06/23     metoCLOPramide  (REGLAN ) 10 MG tablet Take 1 tablet (10 mg total) by mouth every 6 (six) hours. 07/11/24   Keith, Kayla N, PA-C  omeprazole  (PRILOSEC) 40 MG capsule Take 1 capsule (40 mg total) by mouth every morning 30 minutes to 1 hour before morning meal. 06/26/24     ondansetron  (ZOFRAN -ODT) 4 MG disintegrating tablet Take 1 tablet (4 mg total) by mouth every 8 (eight) hours as needed for nausea or vomiting. 07/11/24   Francis Ileana SAILOR, PA-C   oxcarbazepine  (TRILEPTAL ) 600 MG tablet Take 0.5 tablets (300 mg total) by mouth 2 (two) times daily. 10/12/23 12/05/24  Camara, Amadou, MD  oxcarbazepine  (TRILEPTAL ) 600 MG tablet Take 1/2 tablet (300 mg total) by mouth 2 (two) times daily. 10/12/23   Camara, Amadou, MD  Oxycodone  HCl 10 MG TABS Take 1 tablet (10 mg total) by mouth 2 (two) times daily. 08/28/24     pantoprazole  (PROTONIX ) 20 MG tablet Take 1 tablet (20 mg total) by mouth daily. 07/11/24   Keith, Kayla N, PA-C  polyethylene glycol-electrolytes (NULYTELY) 420 g solution Take as directed 03/28/24 04/01/24    tamsulosin  (FLOMAX ) 0.4 MG CAPS capsule Take 1 capsule (0.4 mg total) by mouth daily. 07/03/24   Henry Ingle, MD  triamcinolone  cream (KENALOG ) 0.1 % Apply topically to rash daily. 03/08/24     DULoxetine  (CYMBALTA ) 30 MG capsule Take 1 capsule (30 mg total) by mouth daily. 10/12/23 04/06/24  Camara, Amadou, MD  traZODone  (DESYREL ) 50 MG tablet Take 1 tablet (50 mg total) by mouth at bedtime. 10/12/23 04/06/24  Camara, Amadou, MD    Allergies: Patient has no known allergies.    Review of Systems  All other systems reviewed and are negative.   Updated Vital Signs BP 110/80 (BP Location: Right Arm)   Pulse 95   Temp 98.4 F (36.9 C)   Resp 16   SpO2 96%   Physical Exam Vitals and nursing note reviewed.  Constitutional:      General: He is not in acute distress.    Appearance: Normal appearance.  HENT:     Head: Normocephalic and atraumatic.     Ears:     Comments: Difficult assessment of right ear.  There is a fair amount of cerumen, there is no active bleeding.  It is hard to visualize the tympanic membrane.  It is possible that there is a TM perforation but again it is difficult to assess secondary to the cerumen and the amount of pain the patient is in. Eyes:     General:        Right eye: No discharge.        Left eye: No discharge.  Cardiovascular:     Rate and Rhythm: Normal rate and regular rhythm.   Pulmonary:     Effort: Pulmonary effort is normal. No respiratory distress.  Musculoskeletal:        General: No deformity.  Skin:    General: Skin is warm and dry.  Neurological:     Mental Status: He is alert and oriented to person, place, and time.  Psychiatric:        Mood and Affect: Mood normal.        Behavior: Behavior normal.     (all labs ordered are listed, but only abnormal results are  displayed) Labs Reviewed - No data to display  EKG: None  Radiology: No results found.   Procedures   Medications Ordered in the ED  oxyCODONE -acetaminophen  (PERCOCET/ROXICET) 5-325 MG per tablet 1 tablet (1 tablet Oral Given 09/09/24 1753)                                    Medical Decision Making Risk Prescription drug management.   This patient is a 53 y.o. male who presents to the ED for concern of ear pain,.   Differential diagnoses prior to evaluation: Eardrum perforation, ear canal laceration, barotrauma, versus other  Past Medical History / Social History / Additional history: Chart reviewed. Pertinent results include: Asthma, COPD, previous TBI, seizures  Physical Exam: Physical exam performed. The pertinent findings include:  Difficult assessment of right ear.  There is a fair amount of cerumen, there is no active bleeding.  It is hard to visualize the tympanic membrane.  It is possible that there is a TM perforation but again it is difficult to assess secondary to the cerumen and the amount of pain the patient is in.  Medications / Treatment: Percocet for pain.   Disposition: After consideration of the diagnostic results and the patients response to treatment, I feel that difficult to assess, I spoke with ENT, Dr. Glendia Imperial who says appropriate for Ciprodex drops, oral analgesia, he can call the office to schedule a close follow-up appointment Monday or Tuesday..   emergency department workup does not suggest an emergent condition requiring admission or  immediate intervention beyond what has been performed at this time. The plan is: as above. The patient is safe for discharge and has been instructed to return immediately for worsening symptoms, change in symptoms or any other concerns.   Final diagnoses:  Injury of ear, initial encounter    ED Discharge Orders          Ordered    oxyCODONE -acetaminophen  (PERCOCET/ROXICET) 5-325 MG tablet  Every 6 hours PRN        09/09/24 1840    ciprofloxacin-dexamethasone  (CIPRODEX) OTIC suspension  2 times daily        09/09/24 1840               Emerlyn Mehlhoff H, PA-C 09/09/24 1840    Doretha Folks, MD 09/10/24 1053

## 2024-09-09 NOTE — ED Provider Notes (Signed)
 [facility]    CSN: [id number] Arrival date & time: 09/09/24  1409      History   Chief Complaint Chief Complaint  Patient presents with   Otalgia    HPI Wayne Hawkins is a 53 y.o. male.   53 year old male presents urgent care with complaints of right ear pain.  This started about 4 days ago.  He reports that the pain is much worse when he lays on his side and at night.  He has had a little bit of muffled hearing.  He has had a history of impacted cerumen on the right in the past and has had to have it cleaned out.  He denies any fevers, chills, congestion, cough.   Otalgia Associated symptoms: no abdominal pain, no cough, no fever, no rash, no sore throat and no vomiting     Past Medical History:  Diagnosis Date   Asthma    COPD (chronic obstructive pulmonary disease) (HCC)    Seizures (HCC)    TBI (traumatic brain injury) [facility])    age 56    Patient Active Problem List   Diagnosis Date Noted   Seizures (HCC) 11/24/2022   Chronic obstructive pulmonary disease (HCC) 11/24/2022   Hypokalemia 11/24/2022   Hypocalcemia 11/24/2022   Hypophosphatemia 11/24/2022   Compression fracture of T4 vertebra (HCC) 11/24/2022   Seizure disorder (HCC) 11/19/2022   Low back pain 11/19/2022   Erectile dysfunction 04/23/2022   Sciatica of right side associated with disorder of lumbar spine 03/26/2022   COPD (chronic obstructive pulmonary disease) with acute bronchitis (HCC) 01/21/2019   Anxiety 07/29/2014   Observed seizure-like activity (HCC) 07/28/2014    History reviewed. No pertinent surgical history.     Home Medications    Prior to Admission medications   Medication Sig Start Date End Date Taking? Authorizing Provider  albuterol  (PROVENTIL  HFA;VENTOLIN  HFA) 108 (90 Base) MCG/ACT inhaler Inhale 2 puffs into the lungs every 6 (six) hours as needed for wheezing or shortness of breath.  12/25/18   [provider]  albuterol  (VENTOLIN  HFA) 108 (90 Base)  MCG/ACT inhaler Inhale 2 puffs into the lungs up to 4 (four) times daily as needed. 03/08/24     albuterol  (VENTOLIN  HFA) 108 (90 Base) MCG/ACT inhaler Inhale 2 puffs into the lungs 4 (four) times daily as needed. 11/02/23     ALPRAZolam  (XANAX ) 0.5 MG tablet Take 1 tablet (0.5 mg total) by mouth 3 (three) times daily as needed. 03/08/24     ALPRAZolam  (XANAX ) 0.5 MG tablet Take 1 tablet (0.5 mg total) by mouth 2 (two) times daily as needed. 08/28/24     bisacodyl  (DULCOLAX) 5 MG EC tablet Take 1 tablet (5 mg total) by mouth as directed. 03/28/24     budesonide -formoterol  (SYMBICORT ) 160-4.5 MCG/ACT inhaler Inhale 2 puffs into the lungs 2 (two) times daily.    [provider]  budesonide -formoterol  (SYMBICORT ) 160-4.5 MCG/ACT inhaler Inhale 1 puff into the lungs 2 (two) times daily. 03/08/24     budesonide -formoterol  (SYMBICORT ) 160-4.5 MCG/ACT inhaler Inhale 1 puff into the lungs 2 (two) times daily. 11/02/23     clindamycin  (CLEOCIN ) 300 MG capsule Take 1 capsule (300 mg total) by mouth every 12 (twelve) hours. 08/16/24     cyclobenzaprine  (FLEXERIL ) 10 MG tablet Take 10 mg by mouth 3 (three) times daily as needed for muscle spasms.    [provider]  cyclobenzaprine  (FLEXERIL ) 10 MG tablet Take 1 tablet (10 mg total) by mouth 3 (three) times  daily. 03/08/24     diazePAM , 20 MG Dose, (VALTOCO  20 MG DOSE) 2 x 10 MG/0.1ML LQPK Place 20 mg (1 spray) into the nose as needed (For seizure lasting more than 2 minutes). **Use 1 spray per nostril** 04/06/24   Gregg Lek, MD  diphenhydrAMINE (BENADRYL) 25 MG tablet Take 50 mg by mouth at bedtime as needed for allergies.    [provider]  gabapentin  (NEURONTIN ) 600 MG tablet Take 1 tablet (600 mg total) by mouth 3 (three) times daily. 02/14/23   Gawaluck, Greylon, MD  gabapentin  (NEURONTIN ) 600 MG tablet Take 1 tablet (600 mg total) by mouth 2 (two) times daily as needed for leg pain. 03/08/24     HYDROcodone -acetaminophen  (NORCO) 10-325  MG tablet Take 1 tablet by mouth every 6 (six) hours as needed for dental pain. 08/16/24     Lifitegrast  (XIIDRA ) 5 % SOLN Place 1 drop into both eyes 2 (two) times daily. 09/06/23     metoCLOPramide  (REGLAN ) 10 MG tablet Take 1 tablet (10 mg total) by mouth every 6 (six) hours. 07/11/24   Keith, Kayla N, PA-C  omeprazole  (PRILOSEC) 40 MG capsule Take 1 capsule (40 mg total) by mouth every morning 30 minutes to 1 hour before morning meal. 06/26/24     ondansetron  (ZOFRAN -ODT) 4 MG disintegrating tablet Take 1 tablet (4 mg total) by mouth every 8 (eight) hours as needed for nausea or vomiting. 07/11/24   Francis Ileana SAILOR, PA-C  oxcarbazepine  (TRILEPTAL ) 600 MG tablet Take 0.5 tablets (300 mg total) by mouth 2 (two) times daily. 10/12/23 12/05/24  Camara, Amadou, MD  oxcarbazepine  (TRILEPTAL ) 600 MG tablet Take 1/2 tablet (300 mg total) by mouth 2 (two) times daily. 10/12/23   Camara, Amadou, MD  Oxycodone  HCl 10 MG TABS Take 1 tablet (10 mg total) by mouth 2 (two) times daily. 08/28/24     pantoprazole  (PROTONIX ) 20 MG tablet Take 1 tablet (20 mg total) by mouth daily. 07/11/24   Keith, Kayla N, PA-C  polyethylene glycol-electrolytes (NULYTELY) 420 g solution Take as directed 03/28/24 04/01/24    tamsulosin  (FLOMAX ) 0.4 MG CAPS capsule Take 1 capsule (0.4 mg total) by mouth daily. 07/03/24   Henry Ingle, MD  triamcinolone  cream (KENALOG ) 0.1 % Apply topically to rash daily. 03/08/24     DULoxetine  (CYMBALTA ) 30 MG capsule Take 1 capsule (30 mg total) by mouth daily. 10/12/23 04/06/24  Camara, Amadou, MD  traZODone  (DESYREL ) 50 MG tablet Take 1 tablet (50 mg total) by mouth at bedtime. 10/12/23 04/06/24  Gregg Lek, MD    Family History Family History  Problem Relation Age of Onset   Hypertension Mother    Hypertension Father     Social History Social History   Tobacco Use   Smoking status: Former    Current packs/day: 0.00    Average packs/day: 1 pack/day for 16.0 years (16.0 ttl pk-yrs)     Types: Cigarettes    Start date: 11/20/2000    Quit date: 11/20/2016    Years since quitting: 7.8   Smokeless tobacco: Never   Tobacco comments:    quit 2 yrs ago   Vaping Use   Vaping status: Former   Quit date: 05/20/2018   Substances: Nicotine, Mixture of cannabinoids  Substance Use Topics   Alcohol use: No   Drug use: Not Currently    Types: Marijuana    Comment: quit 2 yrs ago      Allergies   Patient has no known allergies.  Review of Systems Review of Systems  Constitutional:  Negative for chills and fever.  HENT:  Positive for ear pain. Negative for sore throat.   Eyes:  Negative for pain and visual disturbance.  Respiratory:  Negative for cough and shortness of breath.   Cardiovascular:  Negative for chest pain and palpitations.  Gastrointestinal:  Negative for abdominal pain and vomiting.  Genitourinary:  Negative for dysuria and hematuria.  Musculoskeletal:  Negative for arthralgias and back pain.  Skin:  Negative for color change and rash.  Neurological:  Negative for seizures and syncope.  All other systems reviewed and are negative.    Physical Exam Triage Vital Signs ED Triage Vitals  Encounter Vitals Group     BP 09/09/24 1505 120/78     Girls Systolic BP Percentile --      Girls Diastolic BP Percentile --      Boys Systolic BP Percentile --      Boys Diastolic BP Percentile --      Pulse Rate 09/09/24 1505 90     Resp 09/09/24 1505 18     Temp 09/09/24 1505 98.1 F (36.7 C)     Temp Source 09/09/24 1505 Oral     SpO2 09/09/24 1505 95 %     Weight --      Height --      Head Circumference --      Peak Flow --      Pain Score 09/09/24 1501 9     Pain Loc --      Pain Education --      Exclude from Growth Chart --    No data found.  Updated Vital Signs BP 120/78 (BP Location: Left Arm)   Pulse 90   Temp 98.1 F (36.7 C) (Oral)   Resp 18   SpO2 95%   Visual Acuity Right Eye Distance:   Left Eye Distance:   Bilateral Distance:     Right Eye Near:   Left Eye Near:    Bilateral Near:     Physical Exam Vitals and nursing note reviewed.  Constitutional:      General: He is not in acute distress.    Appearance: He is well-developed.  HENT:     Head: Normocephalic and atraumatic.     Right Ear: There is impacted cerumen.     Left Ear: Tympanic membrane and ear canal normal. There is no impacted cerumen.     Nose: No congestion.  Eyes:     Conjunctiva/sclera: Conjunctivae normal.  Cardiovascular:     Rate and Rhythm: Normal rate and regular rhythm.     Heart sounds: No murmur heard. Pulmonary:     Effort: Pulmonary effort is normal. No respiratory distress.     Breath sounds: Normal breath sounds.  Abdominal:     Palpations: Abdomen is soft.     Tenderness: There is no abdominal tenderness.  Musculoskeletal:        General: No swelling.     Cervical back: Neck supple.  Skin:    General: Skin is warm and dry.     Capillary Refill: Capillary refill takes less than 2 seconds.  Neurological:     Mental Status: He is alert.  Psychiatric:        Mood and Affect: Mood normal.      UC Treatments / Results  Labs (all labs ordered are listed, but only abnormal results are displayed) Labs Reviewed - No data to display  EKG  Radiology No results found.  Procedures Procedures (including critical care time)  Medications Ordered in UC Medications - No data to display  Initial Impression / Assessment and Plan / UC Course  I have reviewed the triage vital signs and the nursing notes.  Pertinent labs & imaging results that were available during my care of the patient were reviewed by me and considered in my medical decision making (see chart for details).     Impacted cerumen of right ear  Right ear pain   On physical exam the right ear canal is impacted with a dry appearing cerumen. Ear irrigation done but on second evaluation there was still a large amount of wet appearing cerumen that was  completely occluding the ear canal. Explained that I would try to use an ear currette to remove more of the cerumen due to the amount still present and patient stating he was still have symptoms although it was starting to feel that the ice was breaking. Using the currette some more of the cerumen was removed and initially patient stated it was feeling better. He did not state during the exam that he was in pain out loud but stated after that his ear was now hurting and that he thought I knew it was hurting him. After the last attempt the patient said it was now hurting worse then prior to arrival. I looked again in the ear and was able to visualize part of the tympanic membrane although there was still a large amount of wet appearing cerumen present. The patient then stated that when he has had his ear cleaned prior they have laid him on his side and let the peroxide sit. I advised we can try that and left the room to ask the nurse, Kim to try this to see if we could get more of the wax out. When the nurse went into the room, the patient reported severe pain in the ear to her and asked to see an MD as patient was concerned that he had damage to his ear due to the increased pain. I went back into the room to speak with the patient and address his concern and his pain. I looked at the ear again and did not see any blood and saw the same appearance of the tympanic membrane intact at the top with persistent wax in the canal. Offered to have Dr. Rolinda look at the ear as well and ask nurse Luke to come to room as well due to patient's concern and pain. The patient agreed. Dr. Rolinda came in and looked in the patient's ear as well and reported to the patient that he did not see any blood and the portion of the tympanic membrane that was visible was intact.  The patient continued to express severe pain in the ear and concern for damage to the ear and at that time reported he would go to the emergency room to have them  evaluate his ear as well.  Final Clinical Impressions(s) / UC Diagnoses   Final diagnoses:  Impacted cerumen of right ear  Right ear pain     Discharge Instructions      Impacted cerumen (earwax) of the right ear.  Ear irrigation done today.       ED Prescriptions   None    PDMP not reviewed this encounter.   Teresa Almarie LABOR, [place] 09/09/24 1837

## 2024-09-09 NOTE — ED Notes (Signed)
 Offered to put peroxide in right ear to help soften wax as provider requested.  When approached, patient was on phone and said his ear was hurting and he needed to see a doctor.  Notified provider

## 2024-09-09 NOTE — Discharge Instructions (Signed)
 Please use the Ciprodex eardrops as directed until you are able to follow-up with ENT for further evaluation.  I have prescribed some oral pain medicine to use in the meantime.  I would avoid getting water into the affected ear as best as possible, try to avoid heavily blowing your nose or otherwise increasing the pressure in your ear.

## 2024-09-09 NOTE — ED Triage Notes (Signed)
 Right ear pain for 4 days.  Pain wakes patient at night.  Denies cough, or cold symptoms.    Flu shot 12 days ago Dental surgery 5 weeks ago.  patient has had issues with this ear in the past and required flushing.    TBI at 53 y.o. History of seizures as an adult.  Patient is out of protonix 

## 2024-09-14 ENCOUNTER — Encounter (HOSPITAL_COMMUNITY): Payer: Self-pay

## 2024-09-14 ENCOUNTER — Emergency Department (HOSPITAL_COMMUNITY)
Admission: EM | Admit: 2024-09-14 | Discharge: 2024-09-14 | Disposition: A | Discharge status: Home / Self Care | Attending: Emergency Medicine | Admitting: Emergency Medicine

## 2024-09-14 ENCOUNTER — Other Ambulatory Visit: Payer: Self-pay

## 2024-09-14 ENCOUNTER — Emergency Department (HOSPITAL_COMMUNITY)

## 2024-09-14 DIAGNOSIS — K644 Residual hemorrhoidal skin tags: Secondary | ICD-10-CM | POA: Diagnosis not present

## 2024-09-14 DIAGNOSIS — K429 Umbilical hernia without obstruction or gangrene: Secondary | ICD-10-CM | POA: Diagnosis not present

## 2024-09-14 DIAGNOSIS — K6289 Other specified diseases of anus and rectum: Secondary | ICD-10-CM | POA: Diagnosis present

## 2024-09-14 DIAGNOSIS — R300 Dysuria: Secondary | ICD-10-CM | POA: Insufficient documentation

## 2024-09-14 LAB — CBC WITH DIFFERENTIAL/PLATELET
Abs Immature Granulocytes: 0.03 K/uL (ref 0.00–0.07)
Basophils Absolute: 0 K/uL (ref 0.0–0.1)
Basophils Relative: 0 %
Eosinophils Absolute: 0.5 K/uL (ref 0.0–0.5)
Eosinophils Relative: 6 %
HCT: 43.6 % (ref 39.0–52.0)
Hemoglobin: 14.6 g/dL (ref 13.0–17.0)
Immature Granulocytes: 0 %
Lymphocytes Relative: 32 %
Lymphs Abs: 2.5 K/uL (ref 0.7–4.0)
MCH: 31.3 pg (ref 26.0–34.0)
MCHC: 33.5 g/dL (ref 30.0–36.0)
MCV: 93.4 fL (ref 80.0–100.0)
Monocytes Absolute: 0.8 K/uL (ref 0.1–1.0)
Monocytes Relative: 11 %
Neutro Abs: 4 K/uL (ref 1.7–7.7)
Neutrophils Relative %: 51 %
Platelets: 313 K/uL (ref 150–400)
RBC: 4.67 MIL/uL (ref 4.22–5.81)
RDW: 12.9 % (ref 11.5–15.5)
WBC: 7.8 K/uL (ref 4.0–10.5)
nRBC: 0 % (ref 0.0–0.2)

## 2024-09-14 LAB — COMPREHENSIVE METABOLIC PANEL WITH GFR
ALT: 30 U/L (ref 0–44)
AST: 21 U/L (ref 15–41)
Albumin: 3.6 g/dL (ref 3.5–5.0)
Alkaline Phosphatase: 56 U/L (ref 38–126)
Anion gap: 13 (ref 5–15)
BUN: 7 mg/dL (ref 6–20)
CO2: 21 mmol/L — ABNORMAL LOW (ref 22–32)
Calcium: 9.1 mg/dL (ref 8.9–10.3)
Chloride: 105 mmol/L (ref 98–111)
Creatinine, Ser: 1.02 mg/dL (ref 0.61–1.24)
GFR, Estimated: >60 mL/min (ref >60)
Glucose, Bld: 99 mg/dL (ref 70–99)
Potassium: 4.2 mmol/L (ref 3.5–5.1)
Sodium: 139 mmol/L (ref 135–145)
Total Bilirubin: 0.4 mg/dL (ref 0.0–1.2)
Total Protein: 6.9 g/dL (ref 6.5–8.1)

## 2024-09-14 LAB — URINALYSIS, ROUTINE W REFLEX MICROSCOPIC
Bilirubin Urine: NEGATIVE
Glucose, UA: NEGATIVE mg/dL
Hgb urine dipstick: NEGATIVE
Ketones, ur: NEGATIVE mg/dL
Leukocytes,Ua: NEGATIVE
Nitrite: NEGATIVE
Protein, ur: NEGATIVE mg/dL
Specific Gravity, Urine: >1.046 — ABNORMAL HIGH (ref 1.005–1.030)
pH: 6 (ref 5.0–8.0)

## 2024-09-14 LAB — I-STAT CHEM 8, ED
BUN: 7 mg/dL (ref 6–20)
Calcium, Ion: 1.16 mmol/L (ref 1.15–1.40)
Chloride: 105 mmol/L (ref 98–111)
Creatinine, Ser: 0.9 mg/dL (ref 0.61–1.24)
Glucose, Bld: 100 mg/dL — ABNORMAL HIGH (ref 70–99)
HCT: 43 % (ref 39.0–52.0)
Hemoglobin: 14.6 g/dL (ref 13.0–17.0)
Potassium: 4.2 mmol/L (ref 3.5–5.1)
Sodium: 140 mmol/L (ref 135–145)
TCO2: 23 mmol/L (ref 22–32)

## 2024-09-14 MED ORDER — OXYCODONE-ACETAMINOPHEN 5-325 MG PO TABS: 1.0000 | ORAL_TABLET | Freq: Four times a day (QID) | ORAL | 0 refills | Status: AC | PRN

## 2024-09-14 MED ORDER — ALBUTEROL SULFATE HFA 108 (90 BASE) MCG/ACT IN AERS
2.0000 | INHALATION_SPRAY | Freq: Once | RESPIRATORY_TRACT | Status: AC
  Administered 2024-09-14: 2 via RESPIRATORY_TRACT
  Filled 2024-09-14: qty 6.7

## 2024-09-14 MED ORDER — IOHEXOL 350 MG/ML SOLN
75.0000 mL | Freq: Once | INTRAVENOUS | Status: AC | PRN
  Administered 2024-09-14: 75 mL via INTRAVENOUS

## 2024-09-14 MED ORDER — HYDROCORTISONE (PERIANAL) 2.5 % EX CREA: 1.0000 | TOPICAL_CREAM | Freq: Two times a day (BID) | CUTANEOUS | 0 refills | Status: AC

## 2024-09-14 NOTE — ED Provider Notes (Signed)
 [place] EMERGENCY DEPARTMENT AT [facility] Provider Note   CSN: [id number] Arrival date & time: 09/14/24  1252     Patient presents with: Hemorrhoids   Wayne Hawkins is a 53 y.o. male.   The history is provided by the patient and medical records. No language interpreter was used.  Rectal Bleeding Quality:  Bright red Amount:  Moderate Duration:  5 days Timing:  Sporadic Chronicity:  New Context: hemorrhoids   Context: not rectal injury   Similar prior episodes: no   Relieved by:  Nothing Worsened by:  Wiping Ineffective treatments:  Hemorrhoid cream Associated symptoms: abdominal pain   Associated symptoms: no fever, no recent illness and no vomiting   Associated symptoms comment:  Wheezing      Prior to Admission medications   Medication Sig Start Date End Date Taking? Authorizing Provider  albuterol  (PROVENTIL  HFA;VENTOLIN  HFA) 108 (90 Base) MCG/ACT inhaler Inhale 2 puffs into the lungs every 6 (six) hours as needed for wheezing or shortness of breath.  12/25/18   [provider]  albuterol  (VENTOLIN  HFA) 108 (90 Base) MCG/ACT inhaler Inhale 2 puffs into the lungs up to 4 (four) times daily as needed. 03/08/24     albuterol  (VENTOLIN  HFA) 108 (90 Base) MCG/ACT inhaler Inhale 2 puffs into the lungs 4 (four) times daily as needed. 11/02/23     ALPRAZolam  (XANAX ) 0.5 MG tablet Take 1 tablet (0.5 mg total) by mouth 3 (three) times daily as needed. 03/08/24     ALPRAZolam  (XANAX ) 0.5 MG tablet Take 1 tablet (0.5 mg total) by mouth 2 (two) times daily as needed. 08/28/24     bisacodyl  (DULCOLAX) 5 MG EC tablet Take 1 tablet (5 mg total) by mouth as directed. 03/28/24     budesonide -formoterol  (SYMBICORT ) 160-4.5 MCG/ACT inhaler Inhale 2 puffs into the lungs 2 (two) times daily.    [provider]  budesonide -formoterol  (SYMBICORT ) 160-4.5 MCG/ACT inhaler Inhale 1 puff into the lungs 2 (two) times daily. 03/08/24     budesonide -formoterol  (SYMBICORT )  160-4.5 MCG/ACT inhaler Inhale 1 puff into the lungs 2 (two) times daily. 11/02/23     ciprofloxacin-dexamethasone  (CIPRODEX) OTIC suspension Place 4 drops into the right ear 2 (two) times daily. 09/09/24   Prosperi, Christian H, PA-C  clindamycin  (CLEOCIN ) 300 MG capsule Take 1 capsule (300 mg total) by mouth every 12 (twelve) hours. 08/16/24     cyclobenzaprine  (FLEXERIL ) 10 MG tablet Take 10 mg by mouth 3 (three) times daily as needed for muscle spasms.    [provider]  cyclobenzaprine  (FLEXERIL ) 10 MG tablet Take 1 tablet (10 mg total) by mouth 3 (three) times daily. 03/08/24     diazePAM , 20 MG Dose, (VALTOCO  20 MG DOSE) 2 x 10 MG/0.1ML LQPK Place 20 mg (1 spray) into the nose as needed (For seizure lasting more than 2 minutes). **Use 1 spray per nostril** 04/06/24   Gregg Lek, MD  diphenhydrAMINE (BENADRYL) 25 MG tablet Take 50 mg by mouth at bedtime as needed for allergies.    [provider]  gabapentin  (NEURONTIN ) 600 MG tablet Take 1 tablet (600 mg total) by mouth 3 (three) times daily. 02/14/23   Gawaluck, Greylon, MD  gabapentin  (NEURONTIN ) 600 MG tablet Take 1 tablet (600 mg total) by mouth 2 (two) times daily as needed for leg pain. 03/08/24     HYDROcodone -acetaminophen  (NORCO) 10-325 MG tablet Take 1 tablet by mouth every 6 (six) hours as needed for dental pain. 08/16/24  Lifitegrast  (XIIDRA ) 5 % SOLN Place 1 drop into both eyes 2 (two) times daily. 09/06/23     metoCLOPramide  (REGLAN ) 10 MG tablet Take 1 tablet (10 mg total) by mouth every 6 (six) hours. 07/11/24   Keith, Kayla N, PA-C  omeprazole  (PRILOSEC) 40 MG capsule Take 1 capsule (40 mg total) by mouth every morning 30 minutes to 1 hour before morning meal. 06/26/24     ondansetron  (ZOFRAN -ODT) 4 MG disintegrating tablet Take 1 tablet (4 mg total) by mouth every 8 (eight) hours as needed for nausea or vomiting. 07/11/24   Francis Ileana SAILOR, PA-C  oxcarbazepine  (TRILEPTAL ) 600 MG tablet Take 0.5 tablets (300 mg  total) by mouth 2 (two) times daily. 10/12/23 12/05/24  Camara, Amadou, MD  oxcarbazepine  (TRILEPTAL ) 600 MG tablet Take 1/2 tablet (300 mg total) by mouth 2 (two) times daily. 10/12/23   Camara, Amadou, MD  Oxycodone  HCl 10 MG TABS Take 1 tablet (10 mg total) by mouth 2 (two) times daily. 08/28/24     oxyCODONE -acetaminophen  (PERCOCET/ROXICET) 5-325 MG tablet Take 1 tablet by mouth every 6 (six) hours as needed for severe pain (pain score 7-10). 09/09/24   Prosperi, Christian H, PA-C  pantoprazole  (PROTONIX ) 20 MG tablet Take 1 tablet (20 mg total) by mouth daily. 07/11/24   Keith, Kayla N, PA-C  polyethylene glycol-electrolytes (NULYTELY) 420 g solution Take as directed 03/28/24 04/01/24    tamsulosin  (FLOMAX ) 0.4 MG CAPS capsule Take 1 capsule (0.4 mg total) by mouth daily. 07/03/24   Henry Ingle, MD  triamcinolone  cream (KENALOG ) 0.1 % Apply topically to rash daily. 03/08/24     DULoxetine  (CYMBALTA ) 30 MG capsule Take 1 capsule (30 mg total) by mouth daily. 10/12/23 04/06/24  Camara, Amadou, MD  traZODone  (DESYREL ) 50 MG tablet Take 1 tablet (50 mg total) by mouth at bedtime. 10/12/23 04/06/24  Camara, Amadou, MD    Allergies: Patient has no known allergies.    Review of Systems  Constitutional:  Negative for chills, diaphoresis, fatigue and fever.  HENT:  Negative for congestion.   Respiratory:  Positive for wheezing. Negative for cough and shortness of breath.   Gastrointestinal:  Positive for abdominal pain and hematochezia. Negative for constipation, diarrhea, nausea and vomiting.  Genitourinary:  Positive for dysuria. Negative for flank pain.  Musculoskeletal:  Negative for back pain, neck pain and neck stiffness.  Skin:  Negative for rash and wound.  Neurological:  Negative for headaches.  Psychiatric/Behavioral:  Negative for agitation and confusion.   All other systems reviewed and are negative.   Updated Vital Signs BP 112/80 (BP Location: Right Arm)   Pulse 88   Temp 97.8 F  (36.6 C) (Oral)   Resp 18   Ht 6' 1 (1.854 m)   Wt 82.6 kg   SpO2 96%   BMI 24.01 kg/m   Physical Exam Vitals and nursing note reviewed. Exam conducted with a chaperone present.  Constitutional:      General: He is not in acute distress.    Appearance: He is well-developed. He is not ill-appearing, toxic-appearing or diaphoretic.  HENT:     Head: Normocephalic and atraumatic.     Nose: No congestion or rhinorrhea.  Eyes:     Extraocular Movements: Extraocular movements intact.     Conjunctiva/sclera: Conjunctivae normal.     Pupils: Pupils are equal, round, and reactive to light.  Cardiovascular:     Rate and Rhythm: Normal rate and regular rhythm.     Heart sounds: No murmur  heard. Pulmonary:     Effort: Pulmonary effort is normal. No respiratory distress.     Breath sounds: Wheezing present. No rhonchi or rales.  Chest:     Chest wall: No tenderness.  Abdominal:     General: Abdomen is flat.     Palpations: Abdomen is soft.     Tenderness: There is no abdominal tenderness. There is no guarding or rebound.  Genitourinary:    Rectum: Tenderness and external hemorrhoid present. No mass.     Comments: Patient had external hemorrhoid that recently opened and bled.  Some tenderness but no large thrombosis seen.  Otherwise no rectal tenderness and no evidence of fissure or fistula or rash. Musculoskeletal:        General: Tenderness present. No swelling.     Cervical back: Neck supple. No tenderness.  Skin:    General: Skin is warm and dry.     Capillary Refill: Capillary refill takes less than 2 seconds.     Findings: No erythema or rash.  Neurological:     General: No focal deficit present.     Mental Status: He is alert.  Psychiatric:        Mood and Affect: Mood normal.     (all labs ordered are listed, but only abnormal results are displayed) Labs Reviewed  COMPREHENSIVE METABOLIC PANEL WITH GFR - Abnormal; Notable for the following components:      Result Value    CO2 21 (*)    All other components within normal limits  URINALYSIS, ROUTINE W REFLEX MICROSCOPIC - Abnormal; Notable for the following components:   Specific Gravity, Urine >1.046 (*)    All other components within normal limits  I-STAT CHEM 8, ED - Abnormal; Notable for the following components:   Glucose, Bld 100 (*)    All other components within normal limits  URINE CULTURE  CBC WITH DIFFERENTIAL/PLATELET    EKG: None  Radiology: CT ABDOMEN PELVIS W CONTRAST Result Date: 09/14/2024 CLINICAL DATA:  Abdominal pain, ruptured hemorrhoid EXAM: CT ABDOMEN AND PELVIS WITH CONTRAST TECHNIQUE: Multidetector CT imaging of the abdomen and pelvis was performed using the standard protocol following bolus administration of intravenous contrast. RADIATION DOSE REDUCTION: This exam was performed according to the departmental dose-optimization program which includes automated exposure control, adjustment of the mA and/or kV according to patient size and/or use of iterative reconstruction technique. CONTRAST:  75mL OMNIPAQUE  IOHEXOL  350 MG/ML SOLN COMPARISON:  07/11/2024 FINDINGS: Lower chest: No acute pleuroparenchymal lung disease. Hepatobiliary: No focal liver abnormality is seen. No gallstones, gallbladder wall thickening, or biliary dilatation. Pancreas: Unremarkable. No pancreatic ductal dilatation or surrounding inflammatory changes. Spleen: Normal in size without focal abnormality. Adrenals/Urinary Tract: Adrenal glands are unremarkable. Kidneys are normal, without renal calculi, focal lesion, or hydronephrosis. Bladder is unremarkable. Stomach/Bowel: No bowel obstruction or ileus. Normal appendix right lower quadrant. No bowel wall thickening or inflammatory change. Vascular/Lymphatic: No significant vascular findings are present. No enlarged abdominal or pelvic lymph nodes. Reproductive: Prostate is unremarkable. Other: No free fluid or free intraperitoneal gas. Stable small fat containing  umbilical hernia. Musculoskeletal: No acute or destructive bony abnormalities. Reconstructed images demonstrate no additional findings. IMPRESSION: 1. No acute intra-abdominal or intrapelvic process. 2. Stable fat containing umbilical hernia. Electronically Signed   By: Ozell Daring M.D.   On: 09/14/2024 14:48     Procedures   Medications Ordered in the ED  iohexol  (OMNIPAQUE ) 350 MG/ML injection 75 mL (75 mLs Intravenous Contrast Given 09/14/24 1428)  albuterol  (VENTOLIN   HFA) 108 (90 Base) MCG/ACT inhaler 2 puff (2 puffs Inhalation Given 09/14/24 1821)                                    Medical Decision Making Amount and/or Complexity of Data Reviewed Labs: ordered.  Risk Prescription drug management.    Wilman Tucker is a 53 y.o. male with a past medical history significant for seizure disorder, COPD with wheezing, previous back injury, and previous TBI who presents with rectal pain and bleeding, wheezing, dysuria, and some abdominal pain.  According to patient, for the last 5 days or so, he has had pain and a bulge near his rectum that he is concerned is a hemorrhoid and it started bleeding over the last few days.  He said he is also had some abdominal cramping and has had some dysuria.  He has not been able to see another urologist but he is worried about UTI.  He said he had some wheezing but no chest pain or shortness of breath at this time.  He denies any acute trauma or other injuries at this time.  On my exam, lungs had wheezing.  Chest and abdomen were nontender.  With a chaperone a rectal exam was performed and he has an external hemorrhoid that appears to have been recently thrombosed likely over the last 5 days but now had opened and had some bleeding.  No surrounding abscess or evidence of fistula or other infection.  Rest of exam unremarkable.  Patient had some workup in triage including labs and a CT scan.  CT scan did not show abscess or boil near his rectum nor did show  evidence of appendicitis or diverticulitis.  Did show a paramedical hernia for which patient need to follow-up with general surgery.  Will also give him follow-up with general surgery for the hemorrhoids.  We will give prescription for Anusol as he reports some over-the-counter medication not seem to work and some pain medicine.  He will use over-the-counter MiraLAX and stool softener.  We will get a urinalysis given his dysuria and treat if he has UTI.  Will give 2 puffs of albuterol  as he reports he does have some inhalers at home but will give him some now.  Given his otherwise reassuring workup we will hold on more extensive workup.  Anticipate discharge with prescriptions for the Anusol and pain medicine and recommendations follow-up with GI, urology, general surgery, and PCP.  Urinalysis did not show UTI.  Patient be discharged for outpatient follow-up.  He agrees with plan and was discharged in good condition.      Final diagnoses:  External hemorrhoids  Rectal pain  Umbilical hernia without obstruction and without gangrene  Dysuria    ED Discharge Orders          Ordered    hydrocortisone (ANUSOL-HC) 2.5 % rectal cream  2 times daily        09/14/24 1800    oxyCODONE -acetaminophen  (PERCOCET/ROXICET) 5-325 MG tablet  Every 6 hours PRN        09/14/24 1800            Clinical Impression: 1. External hemorrhoids   2. Rectal pain   3. Umbilical hernia without obstruction and without gangrene   4. Dysuria     Disposition: Discharge  Condition: Good  I have discussed the results, Dx and Tx plan with the pt(& family if present). He/she/they  expressed understanding and agree(s) with the plan. Discharge instructions discussed at great length. Strict return precautions discussed and pt &/or family have verbalized understanding of the instructions. No further questions at time of discharge.    New Prescriptions   HYDROCORTISONE (ANUSOL-HC) 2.5 % RECTAL CREAM    Place 1  Application rectally 2 (two) times daily.   OXYCODONE -ACETAMINOPHEN  (PERCOCET/ROXICET) 5-325 MG TABLET    Take 1 tablet by mouth every 6 (six) hours as needed for severe pain (pain score 7-10).    Follow Up: Henry Ingle, MD [address] [place] [address] [phone]     Surgery, [facility] [address] [place] [address] [phone]   for follow up of hemorrhoids and  Pa, [facility] [address] [place] [address] [place] [address] [phone]     [facility] [address] [place]  [address] [phone]       Odesser Tourangeau, Lonni PARAS, MD 09/14/24 [phone]

## 2024-09-14 NOTE — ED Triage Notes (Signed)
 Patient states he has had a hemorrhoid that was hanging out and he thinks it has ruptured. He states he noticed it 2 days ago and it started bleeding this morning. He states that it is painful. Denies taking blood thinners.

## 2024-09-14 NOTE — Discharge Instructions (Addendum)
 Your history, exam, and evaluation today did reveal you have external hemorrhoids causing your symptoms.  It already opened up and was draining which you will can do so.  There was no evidence of infection and the CT scan did not show any abscess or boil.  Please follow-up with general surgery for the hemorrhoidal troubles as well as the umbilical or bellybutton hernia.  Please also follow-up with GI and consider follow-up with urology.  Your urinalysis did not show UTI but a culture was sent just in case there is hidden infection.  Please use the steroid all cream for the hemorrhoids and pain medicine double symptoms as well.  Please consider using over-the-counter MiraLAX to help with your bowels or stool softener.  Please rest and stay hydrated.

## 2024-09-14 NOTE — ED Provider Triage Note (Signed)
 Emergency Medicine Provider Triage Evaluation Note  Wayne Hawkins , a 53 y.o. male  was evaluated in triage.  Pt complains of rectal pain and bleeding.  Patient states he noticed rectal bleeding this morning.  His last bowel movement was yesterday and he is typically regular.  He has not yet had a bowel movement today.  He does report dysuria.  He also reports abdominal pain and tenderness to the left lower quadrant.  No prior history of diverticulitis or diverticulosis on chart review.  Patient has a history of epilepsy and he is medication compliant.  Review of Systems  Positive: Abdominal pain, tenderness, rectal pain, rectal bleeding, dysuria Negative: Fever, chills  Physical Exam  BP 112/80 (BP Location: Right Arm)   Pulse 88   Temp 97.8 F (36.6 C) (Oral)   Resp 18   Ht 6' 1 (1.854 m)   Wt 82.6 kg   SpO2 96%   BMI 24.01 kg/m  Gen:   Awake, no distress , mildly uncomfortable Resp:  Normal effort MSK:   Moves extremities without difficulty Other:  Rectal exam with 1 external hemorrhoid and fluctuance to left side of rectum.  Exquisitely tender to palpation.  Medical Decision Making  Medically screening exam initiated at 1:28 PM.  Appropriate orders placed.  Elgin Carn was informed that the remainder of the evaluation will be completed by another provider, this initial triage assessment does not replace that evaluation, and the importance of remaining in the ED until their evaluation is complete.  CT scan ordered to rule out diverticulitis.  Labs ordered.    Torrence Marry RAMAN, [place] 09/14/24 1333

## 2024-09-16 LAB — URINE CULTURE: Culture: <10000 — AB

## 2024-09-25 ENCOUNTER — Other Ambulatory Visit: Payer: Self-pay

## 2024-09-25 ENCOUNTER — Other Ambulatory Visit (HOSPITAL_COMMUNITY): Payer: Self-pay

## 2024-09-25 MED ORDER — OMEPRAZOLE 40 MG PO CPDR
40.0000 mg | DELAYED_RELEASE_CAPSULE | Freq: Every morning | ORAL | 2 refills | Status: AC
  Filled 2024-09-25: qty 30, 30d supply, fill #0
  Filled 2024-10-18 – 2024-11-03 (×3): qty 30, 30d supply, fill #1

## 2024-09-25 MED ORDER — OXYCODONE HCL 10 MG PO TABS
10.0000 mg | ORAL_TABLET | Freq: Two times a day (BID) | ORAL | 0 refills | Status: DC
  Filled 2024-09-25: qty 56, 28d supply, fill #0

## 2024-09-25 MED ORDER — ALPRAZOLAM 0.5 MG PO TABS
0.5000 mg | ORAL_TABLET | Freq: Two times a day (BID) | ORAL | 5 refills | Status: AC | PRN
  Filled 2024-09-25 – 2024-10-11 (×2): qty 60, 30d supply, fill #0
  Filled 2024-11-08: qty 60, 30d supply, fill #1
  Filled 2024-12-11: qty 60, 30d supply, fill #2

## 2024-10-09 ENCOUNTER — Other Ambulatory Visit (HOSPITAL_COMMUNITY): Payer: Self-pay

## 2024-10-11 ENCOUNTER — Other Ambulatory Visit: Payer: Self-pay

## 2024-10-11 ENCOUNTER — Other Ambulatory Visit (HOSPITAL_COMMUNITY): Payer: Self-pay

## 2024-10-23 ENCOUNTER — Other Ambulatory Visit (HOSPITAL_COMMUNITY): Payer: Self-pay

## 2024-10-23 MED ORDER — OXYCODONE HCL 10 MG PO TABS
10.0000 mg | ORAL_TABLET | Freq: Two times a day (BID) | ORAL | 0 refills | Status: DC
  Filled 2024-10-23: qty 56, 28d supply, fill #0

## 2024-11-02 ENCOUNTER — Other Ambulatory Visit (HOSPITAL_COMMUNITY): Payer: Self-pay

## 2024-11-06 ENCOUNTER — Other Ambulatory Visit: Payer: Self-pay | Admitting: Neurology

## 2024-11-06 ENCOUNTER — Other Ambulatory Visit (HOSPITAL_COMMUNITY): Payer: Self-pay

## 2024-11-06 MED ORDER — OXCARBAZEPINE 600 MG PO TABS
300.0000 mg | ORAL_TABLET | Freq: Two times a day (BID) | ORAL | 6 refills | Status: AC
  Filled 2024-11-06 – 2024-12-05 (×4): qty 60, 60d supply, fill #0

## 2024-11-08 ENCOUNTER — Other Ambulatory Visit: Payer: Self-pay

## 2024-11-13 ENCOUNTER — Encounter (HOSPITAL_COMMUNITY): Payer: Self-pay

## 2024-11-13 ENCOUNTER — Ambulatory Visit (HOSPITAL_COMMUNITY): Admission: EM | Admit: 2024-11-13 | Discharge: 2024-11-13 | Disposition: A | Discharge status: Home / Self Care

## 2024-11-13 DIAGNOSIS — J029 Acute pharyngitis, unspecified: Secondary | ICD-10-CM

## 2024-11-13 LAB — POC COVID19/FLU A&B COMBO
Covid Antigen, POC: NEGATIVE
Influenza A Antigen, POC: NEGATIVE
Influenza B Antigen, POC: NEGATIVE

## 2024-11-13 LAB — POCT RAPID STREP A (OFFICE): Rapid Strep A Screen: NEGATIVE

## 2024-11-13 MED ORDER — PROMETHAZINE-DM 6.25-15 MG/5ML PO SYRP: 5.0000 mL | ORAL_SOLUTION | Freq: Every evening | ORAL | 0 refills | Status: AC | PRN

## 2024-11-13 MED ORDER — PROMETHAZINE-DM 6.25-15 MG/5ML PO SYRP
5.0000 mL | ORAL_SOLUTION | Freq: Every evening | ORAL | 0 refills | Status: DC | PRN
  Filled 2024-11-13: qty 118, 23d supply, fill #0

## 2024-11-13 NOTE — ED Provider Notes (Signed)
 " [facility]    CSN: [id number] Arrival date & time: 11/13/24  1948      History   Chief Complaint No chief complaint on file.   HPI Supreme Rybarczyk is a 53 y.o. male.   This 53 year old male is being seen for acute onset of headache, congestion, cough, sore throat, shortness of breath x 2 days.  He has been taking Alka-Seltzer cold and flu without significant improvement of symptoms.  He denies dizziness, vision changes.  He denies ear pain.  He reports chest and lung discomfort with coughing.  He denies abdominal pain, nausea, vomiting, diarrhea.     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 11    Patient Active Problem List   Diagnosis Date Noted   Seizures (HCC) 11/24/2022   Chronic obstructive pulmonary disease (HCC) 11/24/2022   Hypokalemia 11/24/2022   Hypocalcemia 11/24/2022   Hypophosphatemia 11/24/2022   Compression fracture of T4 vertebra (HCC) 11/24/2022   Seizure disorder (HCC) 11/19/2022   Low back pain 11/19/2022   Erectile dysfunction 04/23/2022   Sciatica of right side associated with disorder of lumbar spine 03/26/2022   COPD (chronic obstructive pulmonary disease) with acute bronchitis (HCC) 01/21/2019   Anxiety 07/29/2014   Observed seizure-like activity (HCC) 07/28/2014    History reviewed. No pertinent surgical history.     Home Medications    Prior to Admission medications  Medication Sig Start Date End Date Taking? Authorizing Provider  albuterol  (PROVENTIL  HFA;VENTOLIN  HFA) 108 (90 Base) MCG/ACT inhaler Inhale 2 puffs into the lungs every 6 (six) hours as needed for wheezing or shortness of breath.  12/25/18   [provider]  albuterol  (VENTOLIN  HFA) 108 (90 Base) MCG/ACT inhaler Inhale 2 puffs into the lungs up to 4 (four) times daily as needed. 03/08/24     albuterol  (VENTOLIN  HFA) 108 (90 Base)  MCG/ACT inhaler Inhale 2 puffs into the lungs 4 (four) times daily as needed. 11/02/23     ALPRAZolam  (XANAX ) 0.5 MG tablet Take 1 tablet (0.5 mg total) by mouth 3 (three) times daily as needed. 03/08/24     ALPRAZolam  (XANAX ) 0.5 MG tablet Take 1 tablet (0.5 mg total) by mouth 2 (two) times daily as needed for anxiety. 09/25/24     bisacodyl  (DULCOLAX) 5 MG EC tablet Take 1 tablet (5 mg total) by mouth as directed. 03/28/24     budesonide -formoterol  (SYMBICORT ) 160-4.5 MCG/ACT inhaler Inhale 2 puffs into the lungs 2 (two) times daily.    [provider]  budesonide -formoterol  (SYMBICORT ) 160-4.5 MCG/ACT inhaler Inhale 1 puff into the lungs 2 (two) times daily. 03/08/24     budesonide -formoterol  (SYMBICORT ) 160-4.5 MCG/ACT inhaler Inhale 1 puff into the lungs 2 (two) times daily. 11/02/23     ciprofloxacin -dexamethasone  (CIPRODEX ) OTIC suspension Place 4 drops into the right ear 2 (two) times daily. 09/09/24   Prosperi, Christian H, PA-C  clindamycin  (CLEOCIN ) 300 MG capsule Take 1 capsule (300 mg total) by mouth every 12 (twelve) hours. 08/16/24     cyclobenzaprine  (FLEXERIL ) 10 MG tablet Take 10 mg by mouth 3 (three) times daily as needed for muscle spasms.    [provider]  cyclobenzaprine  (FLEXERIL ) 10 MG tablet Take 1 tablet (10 mg total) by mouth 3 (three) times daily. 03/08/24     diazePAM , 20 MG Dose, (VALTOCO  20 MG DOSE)  2 x 10 MG/0.1ML LQPK Place 20 mg (1 spray) into the nose as needed (For seizure lasting more than 2 minutes). **Use 1 spray per nostril** 04/06/24   Gregg Lek, MD  diphenhydrAMINE (BENADRYL) 25 MG tablet Take 50 mg by mouth at bedtime as needed for allergies.    [provider]  gabapentin  (NEURONTIN ) 600 MG tablet Take 1 tablet (600 mg total) by mouth 3 (three) times daily. 02/14/23   Gawaluck, Greylon, MD  gabapentin  (NEURONTIN ) 600 MG tablet Take 1 tablet (600 mg total) by mouth 2 (two) times daily as needed for leg pain. 03/08/24      HYDROcodone -acetaminophen  (NORCO) 10-325 MG tablet Take 1 tablet by mouth every 6 (six) hours as needed for dental pain. 08/16/24     hydrocortisone  (ANUSOL -HC) 2.5 % rectal cream Place 1 Application rectally 2 (two) times daily. 09/14/24   Tegeler, Lonni PARAS, MD  Lifitegrast  (XIIDRA ) 5 % SOLN Place 1 drop into both eyes 2 (two) times daily. 09/06/23     metoCLOPramide  (REGLAN ) 10 MG tablet Take 1 tablet (10 mg total) by mouth every 6 (six) hours. 07/11/24   Keith, Kayla N, PA-C  omeprazole  (PRILOSEC) 40 MG capsule Take 1 capsule (40 mg total) by mouth every morning 30 minutes to 1 hour before morning meal. 09/25/24     ondansetron  (ZOFRAN -ODT) 4 MG disintegrating tablet Take 1 tablet (4 mg total) by mouth every 8 (eight) hours as needed for nausea or vomiting. 07/11/24   Francis Ileana SAILOR, PA-C  oxcarbazepine  (TRILEPTAL ) 600 MG tablet Take 0.5 tablets (300 mg total) by mouth 2 (two) times daily. 10/12/23 12/05/24  Camara, Amadou, MD  oxcarbazepine  (TRILEPTAL ) 600 MG tablet Take 1/2 tablet (300 mg total) by mouth 2 (two) times daily. 11/06/24   Camara, Amadou, MD  Oxycodone  HCl 10 MG TABS Take 1 tablet (10 mg total) by mouth 2 (two) times daily. 10/23/24     oxyCODONE -acetaminophen  (PERCOCET/ROXICET) 5-325 MG tablet Take 1 tablet by mouth every 6 (six) hours as needed for severe pain (pain score 7-10). 09/09/24   Prosperi, Christian H, PA-C  oxyCODONE -acetaminophen  (PERCOCET/ROXICET) 5-325 MG tablet Take 1 tablet by mouth every 6 (six) hours as needed for severe pain (pain score 7-10). 09/14/24   Tegeler, Lonni PARAS, MD  pantoprazole  (PROTONIX ) 20 MG tablet Take 1 tablet (20 mg total) by mouth daily. 07/11/24   Keith, Kayla N, PA-C  polyethylene glycol-electrolytes (NULYTELY) 420 g solution Take as directed 03/28/24 04/01/24    tamsulosin  (FLOMAX ) 0.4 MG CAPS capsule Take 1 capsule (0.4 mg total) by mouth daily. 07/03/24   Henry Ingle, MD  triamcinolone  cream (KENALOG ) 0.1 % Apply topically to rash  daily. 03/08/24     DULoxetine  (CYMBALTA ) 30 MG capsule Take 1 capsule (30 mg total) by mouth daily. 10/12/23 04/06/24  Camara, Amadou, MD  traZODone  (DESYREL ) 50 MG tablet Take 1 tablet (50 mg total) by mouth at bedtime. 10/12/23 04/06/24  Camara, Amadou, MD    Family History Family History  Problem Relation Age of Onset   Hypertension Mother    Hypertension Father     Social History Social History[1]   Allergies   Patient has no known allergies.   Review of Systems Review of Systems  Constitutional:  Positive for activity change. Negative for appetite change, chills and fever.  HENT:  Positive for congestion and sore throat. Negative for ear pain.   Eyes:  Negative for visual disturbance.  Respiratory:  Positive for cough and shortness of breath.  Cardiovascular:  Positive for chest pain.  Gastrointestinal:  Negative for abdominal pain, diarrhea, nausea and vomiting.  Skin:  Negative for color change and rash.  Neurological:  Positive for headaches. Negative for dizziness.     Physical Exam Triage Vital Signs ED Triage Vitals [11/13/24 2030]  Encounter Vitals Group     BP (!) 134/92     Girls Systolic BP Percentile      Girls Diastolic BP Percentile      Boys Systolic BP Percentile      Boys Diastolic BP Percentile      Pulse Rate 92     Resp 17     Temp 98.1 F (36.7 C)     Temp Source Oral     SpO2 96 %     Weight      Height      Head Circumference      Peak Flow      Pain Score      Pain Loc      Pain Education      Exclude from Growth Chart    No data found.  Updated Vital Signs BP (!) 134/92 (BP Location: Right Arm)   Pulse 92   Temp 98.1 F (36.7 C) (Oral)   Resp 17   SpO2 96%   Visual Acuity Right Eye Distance:   Left Eye Distance:   Bilateral Distance:    Right Eye Near:   Left Eye Near:    Bilateral Near:     Physical Exam Vitals and nursing note reviewed.  Constitutional:      General: He is awake. He is not in acute  distress.    Appearance: Normal appearance. He is well-developed. He is not ill-appearing or toxic-appearing.     Comments: Pleasant male appearing stated age found sitting in chair in no acute distress.  HENT:     Head: Normocephalic and atraumatic.     Right Ear: Tympanic membrane and external ear normal.     Left Ear: Tympanic membrane and external ear normal.     Nose: Rhinorrhea present.     Mouth/Throat:     Lips: Pink.     Mouth: Mucous membranes are moist.     Pharynx: Postnasal drip present. No pharyngeal swelling, oropharyngeal exudate or posterior oropharyngeal erythema.  Eyes:     Conjunctiva/sclera: Conjunctivae normal.  Cardiovascular:     Rate and Rhythm: Normal rate and regular rhythm.     Heart sounds: Normal heart sounds. No murmur heard. Pulmonary:     Effort: Pulmonary effort is normal. No respiratory distress.     Breath sounds: Normal breath sounds.  Abdominal:     General: Bowel sounds are normal.     Palpations: Abdomen is soft.     Tenderness: There is no abdominal tenderness.  Skin:    General: Skin is warm and dry.     Capillary Refill: Capillary refill takes less than 2 seconds.  Neurological:     Mental Status: He is alert.  Psychiatric:        Mood and Affect: Mood normal.        Behavior: Behavior is cooperative.      UC Treatments / Results  Labs (all labs ordered are listed, but only abnormal results are displayed) Labs Reviewed  POCT RAPID STREP A (OFFICE)  POC COVID19/FLU A&B COMBO    EKG   Radiology No results found.  Procedures Procedures (including critical care time)  Medications Ordered in UC Medications -  No data to display  Initial Impression / Assessment and Plan / UC Course  I have reviewed the triage vital signs and the nursing notes.  Pertinent labs & imaging results that were available during my care of the patient were reviewed by me and considered in my medical decision making (see chart for details).      Vitals and triage reviewed, patient is hemodynamically stable.  COVID/flu swab is negative.  Strep swab is negative.  Presentation consistent with viral respiratory illness.  He is given a prescription for promethazine -DM cough syrup.  Advised Tylenol , guaifenesin , saline nasal spray.  Advised 2 teaspoons honey and warm water.  Advised humidifier.  Plan of care, follow-up care, return precautions given, no questions at this time. Final Clinical Impressions(s) / UC Diagnoses   Final diagnoses:  Sore throat   Discharge Instructions   None    ED Prescriptions   None    PDMP not reviewed this encounter.    [1]  Social History Tobacco Use   Smoking status: Former    Current packs/day: 0.00    Average packs/day: 1 pack/day for 16.0 years (16.0 ttl pk-yrs)    Types: Cigarettes    Start date: 11/20/2000    Quit date: 11/20/2016    Years since quitting: 7.9   Smokeless tobacco: Never   Tobacco comments:    quit 2 yrs ago   Vaping Use   Vaping status: Former   Quit date: 05/20/2018   Substances: Nicotine, Mixture of cannabinoids  Substance Use Topics   Alcohol use: No   Drug use: Not Currently    Types: Marijuana    Comment: quit 2 yrs ago      Lennice Jon BROCKS, [place] 11/13/24 [id number]  "

## 2024-11-13 NOTE — ED Triage Notes (Signed)
 Pt has c/o congestion, headche,cough and sore throat that start 1.5 days ago. Pt has been taking alka seltzer and took the last dose at 12 noon.

## 2024-11-13 NOTE — Discharge Instructions (Signed)

## 2024-11-14 ENCOUNTER — Other Ambulatory Visit (HOSPITAL_COMMUNITY): Payer: Self-pay

## 2024-11-20 ENCOUNTER — Other Ambulatory Visit (HOSPITAL_COMMUNITY): Payer: Self-pay

## 2024-11-20 MED ORDER — OXYCODONE HCL 10 MG PO TABS
10.0000 mg | ORAL_TABLET | Freq: Two times a day (BID) | ORAL | 0 refills | Status: DC
  Filled 2024-11-20: qty 56, 28d supply, fill #0

## 2024-11-21 ENCOUNTER — Other Ambulatory Visit (HOSPITAL_COMMUNITY): Payer: Self-pay

## 2024-11-22 ENCOUNTER — Other Ambulatory Visit (HOSPITAL_COMMUNITY): Payer: Self-pay

## 2024-12-04 ENCOUNTER — Other Ambulatory Visit (HOSPITAL_COMMUNITY): Payer: Self-pay

## 2024-12-05 ENCOUNTER — Other Ambulatory Visit (HOSPITAL_COMMUNITY): Payer: Self-pay

## 2024-12-11 ENCOUNTER — Other Ambulatory Visit (HOSPITAL_COMMUNITY): Payer: Self-pay

## 2024-12-18 ENCOUNTER — Other Ambulatory Visit (HOSPITAL_COMMUNITY): Payer: Self-pay

## 2024-12-18 MED ORDER — OXYCODONE HCL 10 MG PO TABS
10.0000 mg | ORAL_TABLET | Freq: Three times a day (TID) | ORAL | 0 refills | Status: AC
  Filled 2024-12-18: qty 84, 28d supply, fill #0

## 2024-12-18 MED ORDER — PREDNISONE 10 MG PO TABS
10.0000 mg | ORAL_TABLET | Freq: Two times a day (BID) | ORAL | 0 refills | Status: AC
  Filled 2024-12-18: qty 20, 10d supply, fill #0

## 2024-12-20 ENCOUNTER — Other Ambulatory Visit (HOSPITAL_COMMUNITY): Payer: Self-pay

## 2024-12-25 ENCOUNTER — Ambulatory Visit: Admitting: Neurology

## 2024-12-26 ENCOUNTER — Other Ambulatory Visit: Payer: Self-pay | Admitting: Neurology

## 2024-12-26 NOTE — Telephone Encounter (Signed)
 I called pt.  He needs refill on his Valtoco . Last seen 09/2023.   He missed appt (no showed appt 05-15-2024) then 12-25-2024 missed due to weather, caused his appt to be bumped to 06-2025.  He states that he is taking oxcarbamazepine 600 mg po bid, and duloxetine  30mg  po daily, not taking trazodone  as did not help with sleep.  He does not sleep well.  His memory has worsened (ST , getting words out).  Had TBI when 54 yrs old.  Has epilepsy.  He states he like Dr. Gregg and tells him like it is.

## 2024-12-27 ENCOUNTER — Other Ambulatory Visit (HOSPITAL_COMMUNITY): Payer: Self-pay

## 2024-12-27 MED ORDER — VALTOCO 20 MG DOSE 2 X 10 MG/0.1ML NA LQPK
20.0000 mg | NASAL | 1 refills | Status: AC | PRN
  Filled 2024-12-27: qty 5, 15d supply, fill #0

## 2024-12-28 ENCOUNTER — Other Ambulatory Visit (HOSPITAL_COMMUNITY): Payer: Self-pay

## 2024-12-28 ENCOUNTER — Other Ambulatory Visit: Payer: Self-pay

## 2024-12-29 ENCOUNTER — Other Ambulatory Visit (HOSPITAL_COMMUNITY): Payer: Self-pay

## 2024-12-29 ENCOUNTER — Other Ambulatory Visit: Payer: Self-pay

## 2025-06-25 ENCOUNTER — Ambulatory Visit: Admitting: Neurology
